# Patient Record
Sex: Female | Born: 1965 | Race: White | Hispanic: No | Marital: Married | Smoking: Never smoker
Health system: Southern US, Community
[De-identification: ages and names within clinical notes are randomized; demographics above are authoritative.]

## PROBLEM LIST (undated history)

## (undated) DIAGNOSIS — M545 Low back pain, unspecified: Secondary | ICD-10-CM

## (undated) DIAGNOSIS — N951 Menopausal and female climacteric states: Secondary | ICD-10-CM

## (undated) DIAGNOSIS — E559 Vitamin D deficiency, unspecified: Secondary | ICD-10-CM

## (undated) DIAGNOSIS — G8929 Other chronic pain: Secondary | ICD-10-CM

## (undated) DIAGNOSIS — L309 Dermatitis, unspecified: Secondary | ICD-10-CM

## (undated) DIAGNOSIS — N879 Dysplasia of cervix uteri, unspecified: Secondary | ICD-10-CM

## (undated) DIAGNOSIS — G43909 Migraine, unspecified, not intractable, without status migrainosus: Secondary | ICD-10-CM

## (undated) DIAGNOSIS — R87619 Unspecified abnormal cytological findings in specimens from cervix uteri: Secondary | ICD-10-CM

## (undated) DIAGNOSIS — F988 Other specified behavioral and emotional disorders with onset usually occurring in childhood and adolescence: Secondary | ICD-10-CM

## (undated) DIAGNOSIS — D649 Anemia, unspecified: Secondary | ICD-10-CM

## (undated) DIAGNOSIS — F419 Anxiety disorder, unspecified: Secondary | ICD-10-CM

## (undated) DIAGNOSIS — T7840XA Allergy, unspecified, initial encounter: Secondary | ICD-10-CM

## (undated) DIAGNOSIS — G4700 Insomnia, unspecified: Secondary | ICD-10-CM

## (undated) HISTORY — DX: Insomnia, unspecified: G47.00

## (undated) HISTORY — PX: NOVASURE ABLATION: SHX5394

## (undated) HISTORY — DX: Dermatitis, unspecified: L30.9

## (undated) HISTORY — DX: Unspecified abnormal cytological findings in specimens from cervix uteri: R87.619

## (undated) HISTORY — DX: Anxiety disorder, unspecified: F41.9

## (undated) HISTORY — DX: Dysplasia of cervix uteri, unspecified: N87.9

## (undated) HISTORY — DX: Menopausal and female climacteric states: N95.1

## (undated) HISTORY — DX: Low back pain: M54.5

## (undated) HISTORY — DX: Anemia, unspecified: D64.9

## (undated) HISTORY — DX: Low back pain, unspecified: M54.50

## (undated) HISTORY — DX: Migraine, unspecified, not intractable, without status migrainosus: G43.909

## (undated) HISTORY — PX: AUGMENTATION MAMMAPLASTY: SUR837

## (undated) HISTORY — PX: OOPHORECTOMY: SHX86

## (undated) HISTORY — DX: Allergy, unspecified, initial encounter: T78.40XA

## (undated) HISTORY — DX: Other chronic pain: G89.29

## (undated) HISTORY — PX: GYNECOLOGIC CRYOSURGERY: SHX857

## (undated) HISTORY — DX: Other specified behavioral and emotional disorders with onset usually occurring in childhood and adolescence: F98.8

## (undated) HISTORY — DX: Vitamin D deficiency, unspecified: E55.9

## (undated) SURGERY — Surgical Case
Anesthesia: *Unknown

---

## 1989-08-15 HISTORY — PX: BREAST ENHANCEMENT SURGERY: SHX7

## 1998-11-18 ENCOUNTER — Ambulatory Visit (HOSPITAL_COMMUNITY): Admission: RE | Admit: 1998-11-18 | Discharge: 1998-11-18 | Payer: Self-pay | Admitting: *Deleted

## 1999-02-23 ENCOUNTER — Other Ambulatory Visit: Admission: RE | Admit: 1999-02-23 | Discharge: 1999-02-23 | Payer: Self-pay | Admitting: *Deleted

## 1999-06-08 ENCOUNTER — Ambulatory Visit (HOSPITAL_COMMUNITY): Admission: RE | Admit: 1999-06-08 | Discharge: 1999-06-08 | Payer: Self-pay | Admitting: *Deleted

## 2000-02-28 ENCOUNTER — Other Ambulatory Visit: Admission: RE | Admit: 2000-02-28 | Discharge: 2000-02-28 | Payer: Self-pay | Admitting: *Deleted

## 2001-06-11 ENCOUNTER — Other Ambulatory Visit: Admission: RE | Admit: 2001-06-11 | Discharge: 2001-06-11 | Payer: Self-pay | Admitting: Internal Medicine

## 2001-09-13 ENCOUNTER — Other Ambulatory Visit: Admission: RE | Admit: 2001-09-13 | Discharge: 2001-09-13 | Payer: Self-pay | Admitting: Obstetrics and Gynecology

## 2001-11-29 ENCOUNTER — Ambulatory Visit: Admission: RE | Admit: 2001-11-29 | Discharge: 2001-11-29 | Payer: Self-pay | Admitting: Internal Medicine

## 2002-03-01 ENCOUNTER — Other Ambulatory Visit: Admission: RE | Admit: 2002-03-01 | Discharge: 2002-03-01 | Payer: Self-pay | Admitting: Obstetrics and Gynecology

## 2002-10-15 ENCOUNTER — Other Ambulatory Visit: Admission: RE | Admit: 2002-10-15 | Discharge: 2002-10-15 | Payer: Self-pay | Admitting: Obstetrics and Gynecology

## 2002-10-23 ENCOUNTER — Ambulatory Visit (HOSPITAL_COMMUNITY): Admission: RE | Admit: 2002-10-23 | Discharge: 2002-10-23 | Payer: Self-pay | Admitting: Internal Medicine

## 2002-10-23 ENCOUNTER — Encounter: Payer: Self-pay | Admitting: Internal Medicine

## 2003-08-27 ENCOUNTER — Ambulatory Visit (HOSPITAL_COMMUNITY): Admission: RE | Admit: 2003-08-27 | Discharge: 2003-08-27 | Payer: Self-pay | Admitting: Internal Medicine

## 2003-11-26 ENCOUNTER — Other Ambulatory Visit: Admission: RE | Admit: 2003-11-26 | Discharge: 2003-11-26 | Payer: Self-pay | Admitting: Obstetrics and Gynecology

## 2004-09-13 ENCOUNTER — Other Ambulatory Visit: Admission: RE | Admit: 2004-09-13 | Discharge: 2004-09-13 | Payer: Self-pay | Admitting: Internal Medicine

## 2004-09-17 ENCOUNTER — Ambulatory Visit (HOSPITAL_COMMUNITY): Admission: RE | Admit: 2004-09-17 | Discharge: 2004-09-17 | Payer: Self-pay | Admitting: Internal Medicine

## 2005-11-14 ENCOUNTER — Other Ambulatory Visit: Admission: RE | Admit: 2005-11-14 | Discharge: 2005-11-14 | Payer: Self-pay | Admitting: Obstetrics & Gynecology

## 2006-09-26 ENCOUNTER — Other Ambulatory Visit: Admission: RE | Admit: 2006-09-26 | Discharge: 2006-09-26 | Payer: Self-pay | Admitting: Internal Medicine

## 2006-10-12 ENCOUNTER — Ambulatory Visit (HOSPITAL_COMMUNITY): Admission: RE | Admit: 2006-10-12 | Discharge: 2006-10-12 | Payer: Self-pay | Admitting: Internal Medicine

## 2006-12-21 ENCOUNTER — Ambulatory Visit (HOSPITAL_BASED_OUTPATIENT_CLINIC_OR_DEPARTMENT_OTHER): Admission: RE | Admit: 2006-12-21 | Discharge: 2006-12-22 | Payer: Self-pay | Admitting: Orthopedic Surgery

## 2008-09-15 HISTORY — PX: PAROTIDECTOMY: SUR1003

## 2008-10-10 ENCOUNTER — Ambulatory Visit (HOSPITAL_COMMUNITY): Admission: RE | Admit: 2008-10-10 | Discharge: 2008-10-11 | Payer: Self-pay | Admitting: Otolaryngology

## 2008-10-10 ENCOUNTER — Encounter (INDEPENDENT_AMBULATORY_CARE_PROVIDER_SITE_OTHER): Payer: Self-pay | Admitting: Otolaryngology

## 2010-04-30 ENCOUNTER — Encounter: Admission: RE | Admit: 2010-04-30 | Discharge: 2010-04-30 | Payer: Self-pay | Admitting: Obstetrics & Gynecology

## 2010-08-15 HISTORY — PX: SHOULDER ADHESION RELEASE: SHX773

## 2010-09-04 ENCOUNTER — Encounter: Payer: Self-pay | Admitting: Internal Medicine

## 2010-11-30 LAB — CBC
MCHC: 34.7 g/dL (ref 30.0–36.0)
MCV: 89.7 fL (ref 78.0–100.0)
RBC: 3.98 MIL/uL (ref 3.87–5.11)
WBC: 5.3 10*3/uL (ref 4.0–10.5)

## 2010-11-30 LAB — DIFFERENTIAL: Eosinophils Relative: 1 % (ref 0–5)

## 2010-12-28 NOTE — Op Note (Signed)
Fuller, Carla               ACCOUNT NO.:  [id number]   MEDICAL RECORD NO.:  [id number]          PATIENT TYPE:  OIB   LOCATION:  5128                         FACILITY:  [facility]   PHYSICIAN:  Kinnie Scales. Annalee Genta, M.D.DATE OF BIRTH:  1966-03-18   DATE OF PROCEDURE:  10/10/2008  DATE OF DISCHARGE:                               OPERATIVE REPORT   PREOPERATIVE DIAGNOSIS:  Left parotid mass.   POSTOPERATIVE DIAGNOSIS:  Left parotid mass.   INDICATION FOR SURGERY:  Left parotid mass.   SURGICAL PROCEDURE:  Left superficial parotidectomy with facial nerve  dissection and nerve integrity monitoring (NIMS).   SURGEON:  Kinnie Scales. Annalee Genta, MD   ASSISTANT:  Antony Contras, MD   ANESTHESIA:  General endotracheal.   COMPLICATIONS:  None.   BLOOD LOSS:  Less than 50 mL.   SPECIMENS:  Sent to Pathology.   No complications.   The patient is transferred from the operating room to the recovery room  in stable condition.   BRIEF HISTORY:  The patient is a 45 year old white female, who is  referred for evaluation of a left periparotid mass.  The patient  complained of mild discomfort and sensation of fullness in the left  parotid region.  No history of erythema or infection.  The patient was  evaluated, and a CT scan was obtained, which showed a soft tissue  density mass within the left superficial parotid consistent with an  intraparotid tumor.  No other lymphadenopathy or findings were noted.  Given the patient's history and physical examination, I recommended that  we consider her for left superficial parotidectomy, which was scheduled  under general anesthesia as an outpatient on an elective basis.  The  risks, benefits, and possible complications of surgical procedure were  discussed in detail with the patient and her husband.  They understood  and concurred with our plan for surgery, which is scheduled as above.   PROCEDURE:  The patient was brought to the operating room at  [facility] OR on October 10, 2008, and placed in supine position on  the operating table.  General endotracheal anesthesia was established  without difficulty.  When the patient was adequately anesthetized, she  was injected with 3 mL of 1% lidocaine with 1:100,000 solution of  epinephrine, which was injected along the proposed skin incision in the  left preauricular sulcus and upper neck.  The patient was then  positioned and prepped and draped in sterile fashion.  The [employer]omed nerve  integrity monitoring system (NIMS) was applied at the orbicularis oculi  and orbicularis oris muscle groups on the left-hand side, and the  monitoring was used throughout the surgical portion of the procedure.  With the patient prepped and draped in position properly, the surgical  procedure was begun.   A #15 scalpel was used to create an incision in the preauricular sulcus.  This was carried around the left ear lobe and into the upper neck.  Incisions were made in preexisting skin creases to allow for optimal  healing.  The skin and underlying subcutaneous tissue was then incised  with a 15 scalpel.  The superficial parotid fascia and facial  musculature were identified, and dissection plane was carried out  elevating facial musculature, soft tissue, and skin and leaving the  periparotid fascia intact.  With flaps elevated anteriorly, dissection  was then carried out along the preauricular sulcus following the tragal  cartilage medially and dissecting the superior aspect of the parotid  gland anteriorly.  Inferiorly, the anterior border of  sternocleidomastoid muscle was identified.  Cautery and blunt sharp  dissection were then used in order to immobilize the parotid gland  anteriorly along the sternocleidomastoid muscle.  The dissection was  then carried out along the posterior aspect of the parotid gland.  The  common trunk of the facial nerve was identified and the superior and   inferior branches were carefully dissected.  The parotid mass was  located in the inferior posterior portion of the gland.  The superior  branch of the facial nerve was not dissected beyond the initial  division.  The entire inferior component of the facial nerve was then  dissected.  Using blunt and sharp dissection as well as the harmonic  scalpel, the parotid gland tissue was divided, and the superficial  aspect of the gland including the left posterior tumor was resected.  Common facial vein was identified and preserved.  The inferior branches  of the facial nerve were carefully dissected and preserved and the tumor  and surrounding parotid gland tissue was removed from the surgical  field.  This was sent to Pathology for gross microscopic evaluation.  The patient's wound was then thoroughly irrigated with sterile saline  solution.  There was no active bleeding.  The facial nerve branches were  then individually stimulated with the NIMS monitor set at 0.50 mA, and  there was excellent facial movement throughout.  The patient's wound was  then closed in multiple layers.  A 7-French round drain was placed at  the depth of the incision and sutured to the surrounding skin with a 3-0  Ethilon suture.  The deep periparotid fascia was then closed using a 4-0  Vicryl suture in an interrupted fashion.  Deep subcutaneous closure with  a 5-0 Vicryl suture interrupted and final skin edge was closed with a  running locked 6-0 Ethilon suture.  The patient's wound was then dressed  with bacitracin ointment.  She was awakened from anesthetic, extubated,  and was transferred from the operating room to the recovery room in  stable condition.  There were no complications and blood loss was less  than 50 mL.           ______________________________  Kinnie Scales. Annalee Genta, M.D.     DLS/MEDQ  D:  33/29/5188  T:  10/11/2008  Job:  [id number]

## 2010-12-31 NOTE — Op Note (Signed)
Carla Fuller, Carla Fuller               ACCOUNT NO.:  [id number]   MEDICAL RECORD NO.:  [id number]          PATIENT TYPE:  AMB   LOCATION:  DSC                          FACILITY:  [facility]   PHYSICIAN:  Katy Fitch. Sypher, M.D. DATE OF BIRTH:  April 04, 1966   DATE OF PROCEDURE:  12/21/2006  DATE OF DISCHARGE:                               OPERATIVE REPORT   PREOPERATIVE DIAGNOSIS:  Adhesive capsulitis, left shoulder.   POSTOPERATIVE DIAGNOSIS:  Adhesive capsulitis, left shoulder with  identification of minor partial-thickness rotator cuff degenerative tear  and stage II impingement due to type 2 acromion with adhesive bursitis  in subacromial space and sub-acromioclavicular joint region.   OPERATION:  1. Examination of left shoulder under anesthesia demonstrating      profound adhesive capsulitis with virtually no glenohumeral      rotational motion.  2. Manipulation of left shoulder to lyse adhesive capsulitis      adhesions.  3. Arthroscopic evaluation of left glenohumeral joint followed by      arthroscopic capsulectomy and debridement of granulation tissue      with electrocautery of hyperemic capsular tissues  4. Arthroscopic subacromial debridement with extensive tenolysis of      rotator cuff, resection of the inflammatory bursa and minimal      anteromedial acromioplasty to prevent persistent impingement.   OPERATIONS:  Katy Fitch. Sypher, M.D.   ASSISTANT:  Annye Rusk, P.A.-C.   ANESTHESIA:  General endotracheal supplemented by a left interscalene  block.   SUPERVISING ANESTHESIOLOGIST:  Janetta Hora. Gelene Mink, M.D.   INDICATIONS:  Carla Fuller is a 45 year old woman referred through the  courtesy of Dr. Marisue Brooklyn for evaluation and management of a painful  and stiff left shoulder.  Clinical examination revealed signs of chronic  adhesive capsulitis.   She has failed a more than 71-month course of supervised physical  therapy.  She had progressive pain, loss of range of  motion and was  developing compensatory AC joint pain.   Due to a failure to respond to nonoperative measures, she is brought to  the operating room at this time for evaluation of her shoulder under  anesthesia followed by anticipated arthroscopic debridement,  capsulectomy and subacromial decompression.   After informed consent, she is brought to the operating at this time.   PROCEDURE:  Carla Fuller was brought to operating room and placed in a  supine position upon the operating table.   Dr. Gelene Mink had placed an interscalene block in the holding area with  excellent anesthesia of the left forequarter.   She was transferred to room #2, placed in supine position upon the  operating table and under Dr. Thornton Dales strict supervision, general  endotracheal anesthesia induced.  She was carefully positioned in the  beach-chair position with the aid of a torso and headholder designed for  shoulder arthroscopy.  Her calves were placed in passive compression  devices for prevention of deep vein thrombosis.   The left shoulder was examined under anesthesia.  Initially, her  combined elevation was 90 degrees, primarily scapulothoracic.  She had  virtually no internal or external rotation  of the glenohumeral joint due  to severe adhesive capsulitis.   With gentle manipulation, we increased her elevation to 175 degrees  combined and recovered 90 degrees of glenohumeral external rotation and  70 degrees of glenohumeral internal rotation with the scapula  stabilized.   Thereafter, the left arm was prepped with DuraPrep and draped with  impervious arthroscopy drapes.  The shoulder joint was distended with 20  mL of sterile saline with an anterior spinal needle instrumentation  followed by placement of the scope through a standard posterior viewing  portal with blunt technique.   Diagnostic arthroscopy documented profound adhesive capsulitis and  granulation tissue throughout the  glenohumeral joint.  Multiple digital  images were obtained, documenting this predicament.  An anterior portal  was created under direct vision followed by use of a suction shaver to  debride the granulation tissue followed by use of a bipolar cautery to  obtain hemostasis and to coagulate the hypertrophic synovitis.   After thorough debridement of the glenohumeral joint, the rotator cuff  and capsuloligamentous structures were examined.  There was noted be a  partial deep surface supraspinatus rotator cuff degenerative  predicament.  There was minor fray within the joint; this was debrided  with a suction shaver.  The labrum was intact.  The biceps origin was  intact.  The anterior capsuloligamentous structures as well as the  inferior recess were normal with respect to the integrity of the  ligaments; however, there was granulation tissue in the inferior  capsule.  This could not be addressed arthroscopically due to inability  to instrument the shaver into this position.   After complete debridement of the glenohumeral joint, the scope was  removed and placed in the subacromial space.  There was a profound  adhesive bursitis with the supraspinatus tendon essentially glued to the  deep surface of the acromion.   We ultimately relieved this by instrumenting the scope into the lateral  portal and used a switching stick to break down the bursal adhesions  followed by placement of the suction shaver.   After complete bursectomy, a tenolysis of the rotator cuff was  accomplished.  There was a small medial anterior acromial osteophyte  that was causing impingement.  This was leveled to a type 1 morphology  with a suction shaver.  The [facility] joint was not problematic.  The  coracoacromial ligament was preserved, but a thorough bursectomy was  accomplished, freeing the adhesions between the coracoacromial ligament  and the supraspinatus and rotator interval.  The subacromial space was then  inspected for bleeding points, which were  carefully electrocauterized with bipolar current followed by repair of  the portals with mattress suture of 3-0 Prolene.  There were no apparent  complications..   For aftercare, Carla Fuller was placed in sling.  She was wakened from  general anesthesia and transferred to the recovery room with stable  signs.   She will be discharged to the care of her family and will be reevaluated  the office in 24 hours.  We will initiate immediate range of motion  exercises.   For aftercare, she is provided a prescription for Dilaudid 2 mg one to  two tablets p.o. q.4-6 h. p.r.n. pain, Motrin 600 mg one p.o. q.6 h.  p.r.n. pain and Keflex 500 mg one p.o. q.8 h. for 3 days as a  prophylactic antibiotic.      Katy Fitch Sypher, M.D.  Electronically Signed     RVS/MEDQ  D:  12/21/2006  T:  12/21/2006  Job:  [id number]   cc:   Lovenia Kim, D.O.

## 2011-06-09 ENCOUNTER — Other Ambulatory Visit: Payer: Self-pay | Admitting: Obstetrics & Gynecology

## 2011-06-09 LAB — HM PAP SMEAR

## 2013-03-04 ENCOUNTER — Other Ambulatory Visit (HOSPITAL_COMMUNITY): Payer: Self-pay | Admitting: Internal Medicine

## 2013-03-04 ENCOUNTER — Other Ambulatory Visit: Payer: Self-pay | Admitting: Internal Medicine

## 2013-03-04 DIAGNOSIS — R1013 Epigastric pain: Secondary | ICD-10-CM

## 2013-03-05 ENCOUNTER — Ambulatory Visit (HOSPITAL_COMMUNITY)
Admission: RE | Admit: 2013-03-05 | Discharge: 2013-03-05 | Disposition: A | Discharge status: Home / Self Care | Payer: BC Managed Care – PPO | Source: Ambulatory Visit | Attending: Internal Medicine | Admitting: Internal Medicine

## 2013-03-05 DIAGNOSIS — R110 Nausea: Secondary | ICD-10-CM | POA: Insufficient documentation

## 2013-03-05 DIAGNOSIS — R1013 Epigastric pain: Secondary | ICD-10-CM | POA: Insufficient documentation

## 2013-03-12 ENCOUNTER — Encounter: Payer: Self-pay | Admitting: Internal Medicine

## 2013-03-13 ENCOUNTER — Ambulatory Visit (INDEPENDENT_AMBULATORY_CARE_PROVIDER_SITE_OTHER): Payer: BC Managed Care – PPO | Admitting: Internal Medicine

## 2013-03-13 ENCOUNTER — Encounter: Payer: Self-pay | Admitting: Internal Medicine

## 2013-03-13 VITALS — BP 90/54 | HR 88 | Ht 64.5 in | Wt 136.1 lb

## 2013-03-13 DIAGNOSIS — K625 Hemorrhage of anus and rectum: Secondary | ICD-10-CM

## 2013-03-13 DIAGNOSIS — R194 Change in bowel habit: Secondary | ICD-10-CM

## 2013-03-13 DIAGNOSIS — R198 Other specified symptoms and signs involving the digestive system and abdomen: Secondary | ICD-10-CM

## 2013-03-13 DIAGNOSIS — K5900 Constipation, unspecified: Secondary | ICD-10-CM

## 2013-03-13 MED ORDER — POLYETHYLENE GLYCOL 3350 17 GM/SCOOP PO POWD: 17.0000 g | Freq: Every day | ORAL | Status: DC

## 2013-03-13 MED ORDER — MOVIPREP 100 G PO SOLR
ORAL | Status: DC
Start: 2013-03-13 — End: 2013-03-22

## 2013-03-13 MED ORDER — LIDOCAINE HCL 2 % EX GEL: CUTANEOUS | Status: DC | PRN

## 2013-03-13 NOTE — Progress Notes (Signed)
Patient ID: Carla Fuller, female   DOB: 1966-08-14, 47 y.o.   MRN: [id number] HPI: Carla Fuller is a 47 yo female with PMH of dysfunctional uterine bleeding, mild anemia, and abnormal cervical Pap smear status post ablative procedure is seen in consultation at the request of Dr. Oneta Rack to evaluate rectal bleeding and change in bowel habit. The patient is alone today. She reports several weeks ago developing what she felt was a viral illness. This consisted of headache, mild epigastric abdominal pain, poor appetite and nausea. This lasted several days but improved. During this time she also developed constipation, lower abdominal pressure and fecal urgency. She reports having hard stools and then she developed hematochezia with rectal bleeding. She reports seeing red and dark red blood mixed with her stool and also with wiping. She even had some bleeding into her underwear. She reports several bowel movements which were extremely painful to pass. She also endorses tenesmus, feeling of incomplete evacuation.  Her upper GI symptoms have completely resolved and her appetite has returned to normal. No further nausea or epigastric abdominal pain. She continues to have her issue with constipation. Prior to this illness she reports inconsistent bowel movements ranging from constipation to at times loose stools. This was not associated with abdominal pain or tenesmus. There really which EC scan red blood on the toilet tissue. Weight has been stable. No prior history of colonoscopy. No family history of colon cancer or IBD.  She reports she did donate blood 2 days ago and prior to doing so a fingerstick hemoglobin was 13.5. LMP 2 years ago prior to her cervical ablative procedure  Past Medical History  Diagnosis Date  . Anemia   . Abnormal cells of cervix     PAP    Past Surgical History  Procedure Laterality Date  . Gynecologic cryosurgery    . Chin surgery Left     knot removed  . Novasure ablation    .  Oophorectomy Right   . Breast enhancement surgery      Current Outpatient Prescriptions  Medication Sig Dispense Refill  . ALPRAZolam (XANAX) 1 MG tablet Take 1 mg by mouth at bedtime as needed for sleep.      Marland Kitchen amphetamine-dextroamphetamine (ADDERALL) 15 MG tablet Take 15 mg by mouth 2 (two) times daily as needed.      . Ascorbic Acid (VITAMIN C) 1000 MG tablet Take 1,000 mg by mouth daily.      . Cholecalciferol (VITAMIN D3) 10000 UNITS capsule Take 10,000 Units by mouth daily.      . clonazePAM (KLONOPIN) 2 MG tablet Take 2 mg by mouth at bedtime.      . COCONUT OIL PO Take 2 tablets by mouth daily.      . Cyanocobalamin (VITAMIN B-12) 1000 MCG SUBL Place 1 tablet under the tongue daily.      Marland Kitchen GARCINIA CAMBOGIA-CHROMIUM PO Take 3 tablets by mouth daily.      . magnesium gluconate (MAGONATE) 500 MG tablet Take 500 mg by mouth 2 (two) times daily.      . Omega 3-6-9 Fatty Acids (OMEGA 3-6-9 COMPLEX PO) Take 2 tablets by mouth daily.      . pregabalin (LYRICA) 75 MG capsule Take 225 mg by mouth at bedtime.      . Probiotic Product (RESTORA PO) Take 1 tablet by mouth daily.      Marland Kitchen lidocaine (XYLOCAINE) 2 % jelly Apply topically as needed. Peri anally as needed  30 mL  1  . MOVIPREP 100 G SOLR Use per prep instruction  1 kit  0  . polyethylene glycol powder (GLYCOLAX/MIRALAX) powder Take 17 g by mouth daily.  255 g  3   No current facility-administered medications for this visit.    Allergies  Allergen Reactions  . Percocet (Oxycodone-Acetaminophen)     Family History  Problem Relation Age of Onset  . Breast cancer Paternal Aunt   . Breast cancer Maternal Aunt   . Diabetes Brother   . Heart disease Father   . Heart disease Paternal Grandfather   . Heart disease Maternal Grandfather   . Tongue cancer Father     squamous cell carcinoma mets    History  Substance Use Topics  . Smoking status: Never Smoker   . Smokeless tobacco: Never Used  . Alcohol Use: Yes     Comment:  rarely-wine    ROS: As per history of present illness, otherwise negative  BP 90/54  Pulse 88  Ht 5' 4.5" (1.638 m)  Wt 136 lb 2 oz (61.746 kg)  BMI 23.01 kg/m2 Constitutional: Well-developed and well-nourished. No distress. HEENT: Normocephalic and atraumatic. Oropharynx is clear and moist. No oropharyngeal exudate. Conjunctivae are normal.  No scleral icterus. Neck: Neck supple. Trachea midline. Cardiovascular: Normal rate, regular rhythm and intact distal pulses.  Pulmonary/chest: Effort normal and breath sounds normal. No wheezing, rales or rhonchi. Abdominal: Soft, nontender, nondistended. Bowel sounds active throughout. There are no masses palpable. No hepatosplenomegaly. Extremities: no clubbing, cyanosis, or edema Neurological: Alert and oriented to person place and time. Skin: Skin is warm and dry. No rashes noted. Psychiatric: Normal mood and affect. Behavior is normal.  IMAGING: COMPLETE ABDOMINAL ULTRASOUND -- 03/05/2013   Comparison:  None.   Findings:   Gallbladder:  No gallstones, gallbladder wall thickening, or pericholecystic fluid.   Common bile duct:  Normal in caliber with no stones.  Duct measures 3.1 mm.   Liver:  Normal in size and echogenicity.  No mass or focal lesion.   IVC:  Appears normal.   Pancreas:  No focal abnormality seen.   Spleen:  Normal in size and echogenicity measuring 6.7 cm.   Right Kidney:  Normal measuring 10.1 cm.   Left Kidney:  Normal measuring 10.8 cm.   Abdominal aorta:  No aneurysm identified.   IMPRESSION: Negative abdominal ultrasound.   ASSESSMENT/PLAN: 47 yo female with PMH of dysfunctional uterine bleeding, mild anemia, and abnormal cervical Pap smear status post ablative procedure is seen in consultation at the request of Dr. Oneta Rack to evaluate rectal bleeding and change in bowel habit.  1.  Rectal bleeding/change in bowel habits/peri-anal pain -- some of her symptoms seem related to an anal fissure,  though she reports no anal fissure was seen on perianal examination recently in her primary care office. However with change in bowel habits and tenesmus, I recommended colonoscopy for further evaluation. We discussed the test today including the risks and benefits and she is agreeable to proceed. I would like her to begin MiraLax 17 g twice daily to soften her stools. Once her stools are more soft she can reduce to once daily.  I also give her topical lidocaine for perianal pain until her procedure. Further recommendations after procedure.

## 2013-03-13 NOTE — Patient Instructions (Addendum)
You have been scheduled for a colonoscopy with propofol. Please follow written instructions given to you at your visit today.  Please pick up your prep kit at the pharmacy within the next 1-3 days. If you use inhalers (even only as needed), please bring them with you on the day of your procedure. Your physician has requested that you go to www.startemmi.com and enter the access code given to you at your visit today. This web site gives a general overview about your procedure. However, you should still follow specific instructions given to you by our office regarding your preparation for the procedure.  We have sent the following medications to your pharmacy for you to pick up at your convenience: Topical lidocaine; use peri-anally as needed for pain.  Start taking Miralax 17 g twice daily until stools become loose then go to once a day.                                                We are excited to introduce MyChart, a new best-in-class service that provides you online access to important information in your electronic medical record. We want to make it easier for you to view your health information - all in one secure location - when and where you need it. We expect MyChart will enhance the quality of care and service we provide.  When you register for MyChart, you can:    View your test results.    Request appointments and receive appointment reminders via email.    Request medication renewals.    View your medical history, allergies, medications and immunizations.    Communicate with your physician's office through a password-protected site.    Conveniently print information such as your medication lists.  To find out if MyChart is right for you, please talk to a member of our clinical staff today. We will gladly answer your questions about this free health and wellness tool.  If you are age 47 or older and want a member of your family to have access to your record, you must provide  written consent by completing a proxy form available at our office. Please speak to our clinical staff about guidelines regarding accounts for patients younger than age 75.  As you activate your MyChart account and need any technical assistance, please call the MyChart technical support line at [phone]) 83-CHART [phone]) or email your question to [email or web address]. If you email your question(s), please include your name, a return phone number and the best time to reach you.  If you have non-urgent health-related questions, you can send a message to our office through MyChart at [email or web address]. If you have a medical emergency, call 911.  Thank you for using MyChart as your new health and wellness resource!   MyChart licensed from [employer],  [id number]. Patents Pending.

## 2013-03-22 ENCOUNTER — Ambulatory Visit (AMBULATORY_SURGERY_CENTER): Payer: BC Managed Care – PPO | Admitting: Internal Medicine

## 2013-03-22 ENCOUNTER — Encounter: Payer: Self-pay | Admitting: Internal Medicine

## 2013-03-22 VITALS — BP 115/57 | HR 76 | Temp 97.9°F | Resp 16 | Ht 64.0 in | Wt 136.0 lb

## 2013-03-22 DIAGNOSIS — K625 Hemorrhage of anus and rectum: Secondary | ICD-10-CM

## 2013-03-22 DIAGNOSIS — K6289 Other specified diseases of anus and rectum: Secondary | ICD-10-CM

## 2013-03-22 DIAGNOSIS — D126 Benign neoplasm of colon, unspecified: Secondary | ICD-10-CM

## 2013-03-22 DIAGNOSIS — R194 Change in bowel habit: Secondary | ICD-10-CM

## 2013-03-22 DIAGNOSIS — R198 Other specified symptoms and signs involving the digestive system and abdomen: Secondary | ICD-10-CM

## 2013-03-22 LAB — HM COLONOSCOPY

## 2013-03-22 MED ORDER — SODIUM CHLORIDE 0.9 % IV SOLN: 500.0000 mL | INTRAVENOUS | Status: DC

## 2013-03-22 NOTE — Progress Notes (Signed)
Patient did not experience any of the following events: a burn prior to discharge; a fall within the facility; wrong site/side/patient/procedure/implant event; or a hospital transfer or hospital admission upon discharge from the facility. (G8907) Patient did not have preoperative order for IV antibiotic SSI prophylaxis. (G8918)  

## 2013-03-22 NOTE — Op Note (Signed)
[facility] [address] [place], [address]   COLONOSCOPY PROCEDURE REPORT  PATIENT: Carla Fuller, Carla Fuller  MR#: [id number] BIRTHDATE: 1966/06/06 , 46  yrs. old GENDER: Female ENDOSCOPIST: Beverley Fiedler, MD REFERRED [id number] Oneta Rack, M.D. PROCEDURE DATE:  03/22/2013 PROCEDURE:   Colonoscopy with cold biopsy polypectomy First Screening Colonoscopy - Avg.  risk and is 50 yrs.  old or older - No.  Prior Negative Screening - Now for repeat screening. N/A  History of Adenoma - Now for follow-up colonoscopy & has been > or = to 3 yrs.  N/A  Polyps Removed Today? Yes. ASA CLASS:   Class II INDICATIONS:Rectal Bleeding and Change in bowel habits. MEDICATIONS: MAC sedation, administered by CRNA and propofol (Diprivan) 200mg  IV  DESCRIPTION OF PROCEDURE:   After the risks benefits and alternatives of the procedure were thoroughly explained, informed consent was obtained.  A digital rectal exam revealed external hemorrhoids.   The LB PFC-H190 [id number]  endoscope was introduced through the anus and advanced to the terminal ileum which was intubated for a short distance. No adverse events experienced. The quality of the prep was excellent, using MoviPrep  The instrument was then slowly withdrawn as the colon was fully examined.   COLON FINDINGS: The mucosa appeared normal in the terminal ileum. Two sessile polyps measuring 3-5 mm in size were found in the ascending colon and sigmoid colon.  Polypectomy was performed with cold forceps.  All resections were complete and all polyp tissue was completely retrieved.   The colon mucosa was otherwise normal. Retroflexed views revealed external hemorrhoids with hypertrophied anal papillae. The time to cecum=3 minutes 15 seconds.  Withdrawal time=12 minutes 14 seconds.  The scope was withdrawn and the procedure completed.  COMPLICATIONS: There were no complications.     ENDOSCOPIC IMPRESSION: 1.   Normal mucosa in the terminal  ileum 2.   Two sessile polyps measuring 3-5 mm in size were found in the ascending colon and sigmoid colon; Polypectomy was performed with cold forceps 3.   The colon mucosa was otherwise normal  RECOMMENDATIONS: 1.  Await pathology results 2.  Timing of repeat colonoscopy will be determined by pathology findings. 3.  Would add over the counter probiotic, such as Align, once daily until bowel habits normalize 4.  Can continue Miralax 17 g daily for constipation and to avoid hard stools   eSigned:  Beverley Fiedler, MD 03/22/2013 10:36 AM   cc: The Patient and Lucky Cowboy, MD   PATIENT NAME:  Carla Fuller, Carla Fuller MR#: [id number]

## 2013-03-22 NOTE — Progress Notes (Signed)
Called to room to assist during endoscopic procedure.  Patient ID and intended procedure confirmed with present staff. Received instructions for my participation in the procedure from the performing physician.  

## 2013-03-22 NOTE — Progress Notes (Signed)
  [facility] Anesthesia Post-op Note  Patient: Carla Fuller  Procedure(s) Performed: colonoscopy  Patient Location: [facility] - Recovery Area  Anesthesia Type: Deep Sedation/Propofol  Level of Consciousness: awake, oriented and patient cooperative  Airway and Oxygen Therapy: Patient Spontanous Breathing  Post-op Pain: none  Post-op Assessment:  Post-op Vital signs reviewed, Patient's Cardiovascular Status Stable, Respiratory Function Stable, Patent Airway, No signs of Nausea or vomiting and Pain level controlled  Post-op Vital Signs: Reviewed and stable  Complications: No apparent anesthesia complications  Prentis Langdon E 10:34 AM

## 2013-03-22 NOTE — Patient Instructions (Addendum)
YOU HAD AN ENDOSCOPIC PROCEDURE TODAY AT THE [facility]: Refer to the procedure report that was given to you for any specific questions about what was found during the examination.  If the procedure report does not answer your questions, please call your gastroenterologist to clarify.  If you requested that your care partner not be given the details of your procedure findings, then the procedure report has been included in a sealed envelope for you to review at your convenience later.  YOU SHOULD EXPECT: Some feelings of bloating in the abdomen. Passage of more gas than usual.  Walking can help get rid of the air that was put into your GI tract during the procedure and reduce the bloating. If you had a lower endoscopy (such as a colonoscopy or flexible sigmoidoscopy) you may notice spotting of blood in your stool or on the toilet paper. If you underwent a bowel prep for your procedure, then you may not have a normal bowel movement for a few days.  DIET: Your first meal following the procedure should be a light meal and then it is ok to progress to your normal diet.  A half-sandwich or bowl of soup is an example of a good first meal.  Heavy or fried foods are harder to digest and may make you feel nauseous or bloated.  Likewise meals heavy in dairy and vegetables can cause extra gas to form and this can also increase the bloating.  Drink plenty of fluids but you should avoid alcoholic beverages for 24 hours.  ACTIVITY: Your care partner should take you home directly after the procedure.  You should plan to take it easy, moving slowly for the rest of the day.  You can resume normal activity the day after the procedure however you should NOT DRIVE or use heavy machinery for 24 hours (because of the sedation medicines used during the test).    SYMPTOMS TO REPORT IMMEDIATELY: A gastroenterologist can be reached at any hour.  During normal business hours, 8:30 AM to 5:00 PM Monday through Friday,  call [phone].  After hours and on weekends, please call the GI answering service at [phone] who will take a message and have the physician on call contact you.   Following lower endoscopy (colonoscopy or flexible sigmoidoscopy):  Excessive amounts of blood in the stool  Significant tenderness or worsening of abdominal pains  Swelling of the abdomen that is new, acute  Fever of 100F or higher  FOLLOW UP: If any biopsies were taken you will be contacted by phone or by letter within the next 1-3 weeks.  Call your gastroenterologist if you have not heard about the biopsies in 3 weeks.  Our staff will call the home number listed on your records the next business day following your procedure to check on you and address any questions or concerns that you may have at that time regarding the information given to you following your procedure. This is a courtesy call and so if there is no answer at the home number and we have not heard from you through the emergency physician on call, we will assume that you have returned to your regular daily activities without incident.  SIGNATURES/CONFIDENTIALITY: You and/or your care partner have signed paperwork which will be entered into your electronic medical record.  These signatures attest to the fact that that the information above on your After Visit Summary has been reviewed and is understood.  Full responsibility of the confidentiality of this   discharge information lies with you and/or your care-partner.    Resume medications. Information given on polyps with discharge instructions. 

## 2013-03-25 ENCOUNTER — Telehealth: Payer: Self-pay | Admitting: *Deleted

## 2013-03-25 NOTE — Telephone Encounter (Signed)
Left message that we called for f/u 

## 2013-03-27 ENCOUNTER — Encounter: Payer: Self-pay | Admitting: Internal Medicine

## 2013-06-21 ENCOUNTER — Other Ambulatory Visit: Payer: Self-pay | Admitting: Emergency Medicine

## 2013-06-24 ENCOUNTER — Encounter: Payer: Self-pay | Admitting: Internal Medicine

## 2013-06-24 DIAGNOSIS — E559 Vitamin D deficiency, unspecified: Secondary | ICD-10-CM | POA: Insufficient documentation

## 2013-06-24 DIAGNOSIS — G8929 Other chronic pain: Secondary | ICD-10-CM | POA: Insufficient documentation

## 2013-06-24 DIAGNOSIS — D649 Anemia, unspecified: Secondary | ICD-10-CM

## 2013-06-24 DIAGNOSIS — F988 Other specified behavioral and emotional disorders with onset usually occurring in childhood and adolescence: Secondary | ICD-10-CM | POA: Insufficient documentation

## 2013-06-24 DIAGNOSIS — G43909 Migraine, unspecified, not intractable, without status migrainosus: Secondary | ICD-10-CM

## 2013-06-24 DIAGNOSIS — G4700 Insomnia, unspecified: Secondary | ICD-10-CM

## 2013-06-24 DIAGNOSIS — F419 Anxiety disorder, unspecified: Secondary | ICD-10-CM

## 2013-06-24 DIAGNOSIS — T7840XA Allergy, unspecified, initial encounter: Secondary | ICD-10-CM | POA: Insufficient documentation

## 2013-07-24 ENCOUNTER — Other Ambulatory Visit: Payer: Self-pay | Admitting: Emergency Medicine

## 2013-07-24 MED ORDER — CLONAZEPAM 2 MG PO TABS: 2.0000 mg | ORAL_TABLET | Freq: Every day | ORAL | Status: DC

## 2013-08-13 ENCOUNTER — Other Ambulatory Visit: Payer: Self-pay | Admitting: Emergency Medicine

## 2013-08-13 MED ORDER — AMPHETAMINE-DEXTROAMPHETAMINE 15 MG PO TABS
30.0000 mg | ORAL_TABLET | Freq: Every day | ORAL | Status: DC
Start: 2013-08-13 — End: 2013-10-24

## 2013-08-28 ENCOUNTER — Other Ambulatory Visit: Payer: Self-pay | Admitting: Emergency Medicine

## 2013-08-28 MED ORDER — ALPRAZOLAM 1 MG PO TABS: 1.0000 mg | ORAL_TABLET | Freq: Three times a day (TID) | ORAL | Status: DC | PRN

## 2013-08-29 ENCOUNTER — Other Ambulatory Visit: Payer: Self-pay | Admitting: Internal Medicine

## 2013-08-29 DIAGNOSIS — H01139 Eczematous dermatitis of unspecified eye, unspecified eyelid: Secondary | ICD-10-CM

## 2013-08-29 MED ORDER — MONTELUKAST SODIUM 10 MG PO TABS: 10.0000 mg | ORAL_TABLET | Freq: Every day | ORAL | Status: DC

## 2013-09-03 ENCOUNTER — Other Ambulatory Visit: Payer: Self-pay | Admitting: Internal Medicine

## 2013-09-03 MED ORDER — PREDNISOLONE ACETATE 1 % OP SUSP: 1.0000 [drp] | Freq: Four times a day (QID) | OPHTHALMIC | Status: DC

## 2013-09-09 ENCOUNTER — Other Ambulatory Visit: Payer: Self-pay | Admitting: Physician Assistant

## 2013-09-09 MED ORDER — CLONAZEPAM 2 MG PO TABS: 2.0000 mg | ORAL_TABLET | Freq: Every day | ORAL | Status: DC

## 2013-09-10 ENCOUNTER — Other Ambulatory Visit: Payer: Self-pay | Admitting: Physician Assistant

## 2013-09-14 ENCOUNTER — Other Ambulatory Visit: Payer: Self-pay | Admitting: Internal Medicine

## 2013-09-14 DIAGNOSIS — T7840XA Allergy, unspecified, initial encounter: Secondary | ICD-10-CM

## 2013-09-14 MED ORDER — PREDNISONE 10 MG PO TABS: 10.0000 mg | ORAL_TABLET | ORAL | Status: DC

## 2013-10-04 ENCOUNTER — Other Ambulatory Visit: Payer: Self-pay | Admitting: Emergency Medicine

## 2013-10-04 MED ORDER — PREGABALIN 75 MG PO CAPS: 225.0000 mg | ORAL_CAPSULE | Freq: Every day | ORAL | Status: DC

## 2013-10-18 ENCOUNTER — Other Ambulatory Visit: Payer: Self-pay | Admitting: Internal Medicine

## 2013-10-18 MED ORDER — PREDNISONE 20 MG PO TABS: ORAL_TABLET | ORAL | Status: DC

## 2013-10-24 ENCOUNTER — Other Ambulatory Visit: Payer: Self-pay | Admitting: Emergency Medicine

## 2013-10-24 MED ORDER — AMPHETAMINE-DEXTROAMPHETAMINE 15 MG PO TABS: 15.0000 mg | ORAL_TABLET | Freq: Two times a day (BID) | ORAL | Status: DC

## 2013-12-02 ENCOUNTER — Other Ambulatory Visit: Payer: Self-pay | Admitting: Physician Assistant

## 2013-12-02 ENCOUNTER — Other Ambulatory Visit: Payer: Self-pay | Admitting: Emergency Medicine

## 2013-12-02 MED ORDER — ALPRAZOLAM 1 MG PO TABS: 1.0000 mg | ORAL_TABLET | Freq: Three times a day (TID) | ORAL | Status: DC | PRN

## 2013-12-09 ENCOUNTER — Other Ambulatory Visit: Payer: Self-pay | Admitting: Emergency Medicine

## 2013-12-09 ENCOUNTER — Other Ambulatory Visit: Payer: Self-pay

## 2013-12-09 MED ORDER — PREGABALIN 75 MG PO CAPS: 225.0000 mg | ORAL_CAPSULE | Freq: Every day | ORAL | Status: DC

## 2013-12-09 NOTE — Telephone Encounter (Signed)
RX lyrica 75 mg called to [facility] [phone]

## 2014-01-02 ENCOUNTER — Other Ambulatory Visit: Payer: Self-pay | Admitting: Internal Medicine

## 2014-01-02 MED ORDER — PREDNISONE 20 MG PO TABS: ORAL_TABLET | ORAL | Status: DC

## 2014-02-11 ENCOUNTER — Encounter: Payer: Self-pay | Admitting: Emergency Medicine

## 2014-02-11 ENCOUNTER — Other Ambulatory Visit: Payer: Self-pay | Admitting: Emergency Medicine

## 2014-02-11 ENCOUNTER — Ambulatory Visit (INDEPENDENT_AMBULATORY_CARE_PROVIDER_SITE_OTHER): Payer: BC Managed Care – PPO | Admitting: Emergency Medicine

## 2014-02-11 VITALS — BP 110/56 | HR 88 | Temp 98.1°F | Resp 16 | Wt 146.8 lb

## 2014-02-11 DIAGNOSIS — R5381 Other malaise: Secondary | ICD-10-CM

## 2014-02-11 DIAGNOSIS — R5383 Other fatigue: Principal | ICD-10-CM

## 2014-02-11 LAB — CBC WITH DIFFERENTIAL/PLATELET
BASOS ABS: 0.1 10*3/uL (ref 0.0–0.1)
BASOS PCT: 1 % (ref 0–1)
Eosinophils Absolute: 0 10*3/uL (ref 0.0–0.7)
Eosinophils Relative: 0 % (ref 0–5)
HCT: 38.4 % (ref 36.0–46.0)
HEMOGLOBIN: 13.4 g/dL (ref 12.0–15.0)
Lymphocytes Relative: 35 % (ref 12–46)
Lymphs Abs: 1.9 10*3/uL (ref 0.7–4.0)
MCH: 29.4 pg (ref 26.0–34.0)
MCHC: 34.9 g/dL (ref 30.0–36.0)
MCV: 84.2 fL (ref 78.0–100.0)
Monocytes Absolute: 0.4 10*3/uL (ref 0.1–1.0)
Monocytes Relative: 8 % (ref 3–12)
NEUTROS ABS: 3.1 10*3/uL (ref 1.7–7.7)
Neutrophils Relative %: 56 % (ref 43–77)
Platelets: 331 10*3/uL (ref 150–400)
RBC: 4.56 MIL/uL (ref 3.87–5.11)
RDW: 14.2 % (ref 11.5–15.5)
WBC: 5.5 10*3/uL (ref 4.0–10.5)

## 2014-02-11 MED ORDER — PREGABALIN 75 MG PO CAPS: 225.0000 mg | ORAL_CAPSULE | Freq: Every day | ORAL | Status: DC

## 2014-02-11 NOTE — Progress Notes (Signed)
Subjective:    Patient ID: Carla Fuller, female    DOB: 16-Oct-1965, 48 y.o.   MRN: [id number]  HPI Comments: 48 yo f/u  for medication adjustment. She has decreased Libido and extra vaginal dryness. SHe had ablation for heavy menses 2 years ago so uncertain of menopausal status. She has d/c nasal spray due to issues with dryness. She has d/c multiple medicines/ vitamins without any changes with libido. She is under increased stress. She notes mild increase with overwhelming hot flash symptoms.   She has noticed increased ear pressure since d/c antihistamines. She has started wearing a mouth guard for grinding at night. She notes left ear > right with discomfort.   She notes Lyrica has helped with back pain and sleep issues a nd she has been able to decrease other sleep medications.   Otalgia      Medication List       This list is accurate as of: 02/11/14  2:30 PM.  Always use your most recent med list.               ALPRAZolam 1 MG tablet  Commonly known as:  XANAX  Take 1 tablet (1 mg total) by mouth 3 (three) times daily as needed for sleep.     pregabalin 75 MG capsule  Commonly known as:  LYRICA  Take 3 capsules (225 mg total) by mouth at bedtime.     RESTORA PO  Take 1 tablet by mouth daily.     Vitamin B-12 1000 MCG Subl  Place 1 tablet under the tongue daily.     vitamin C 1000 MG tablet  Take 1,000 mg by mouth daily.     Vitamin D3 10000 UNITS capsule  Take 10,000 Units by mouth daily.       Allergies  Allergen Reactions  . Percocet [Oxycodone-Acetaminophen]    Past Medical History  Diagnosis Date  . Anemia   . Abnormal cells of cervix     PAP  . Anxiety   . Insomnia   . Unspecified vitamin D deficiency   . ADD (attention deficit disorder)   . Migraines   . Allergy   . Chronic lumbar pain     Sacrum      Review of Systems  HENT: Positive for ear pain.   All other systems reviewed and are negative.  BP 110/56  Pulse 88  Temp(Src)  98.1 F (36.7 C)  Resp 16  Wt 146 lb 12.8 oz (66.588 kg)     Objective:   Physical Exam  Nursing note and vitals reviewed. Constitutional: She is oriented to person, place, and time. She appears well-developed and well-nourished.  HENT:  Head: Normocephalic and atraumatic.  Right Ear: External ear normal.  Left Ear: External ear normal.  Nose: Nose normal.  Mouth/Throat: Oropharynx is clear and moist.  Cloudy TM's bilaterally   Eyes: Conjunctivae and EOM are normal.  Neck: Normal range of motion.  Cardiovascular: Normal rate, regular rhythm, normal heart sounds and intact distal pulses.   Pulmonary/Chest: Effort normal and breath sounds normal.  Musculoskeletal: Normal range of motion.  Lymphadenopathy:    She has no cervical adenopathy.  Neurological: She is alert and oriented to person, place, and time.  Skin: Skin is warm and dry.  Psychiatric: She has a normal mood and affect. Her behavior is normal. Judgment and thought content normal.          Assessment & Plan:  1. Allergic rhinitis- Try NS  QOD, increase H2o, allergy hygiene explained.   2. Vaginal dryness/ decreased libido/ stress- Advised decrease stress, try ABolene moisturizer, check labs. If no change needs Gyn eval. ADvised avocados, almond, zinc

## 2014-02-12 LAB — IRON AND TIBC
%SAT: 34 % (ref 20–55)
Iron: 112 ug/dL (ref 42–145)
TIBC: 327 ug/dL (ref 250–470)
UIBC: 215 ug/dL (ref 125–400)

## 2014-02-12 LAB — FOLATE RBC: RBC FOLATE: 443 ng/mL (ref >280)

## 2014-02-12 LAB — HEPATIC FUNCTION PANEL
ALBUMIN: 5 g/dL (ref 3.5–5.2)
ALT: 19 U/L (ref 0–35)
AST: 18 U/L (ref 0–37)
Alkaline Phosphatase: 71 U/L (ref 39–117)
BILIRUBIN DIRECT: 0.1 mg/dL (ref 0.0–0.3)
BILIRUBIN TOTAL: 0.4 mg/dL (ref 0.2–1.2)
Indirect Bilirubin: 0.3 mg/dL (ref 0.2–1.2)
Total Protein: 7 g/dL (ref 6.0–8.3)

## 2014-02-12 LAB — VITAMIN B12: VITAMIN B 12: 835 pg/mL (ref 211–911)

## 2014-02-12 LAB — BASIC METABOLIC PANEL WITH GFR
BUN: 11 mg/dL (ref 6–23)
CALCIUM: 10.2 mg/dL (ref 8.4–10.5)
CO2: 32 mEq/L (ref 19–32)
CREATININE: 0.9 mg/dL (ref 0.50–1.10)
Chloride: 102 mEq/L (ref 96–112)
GFR, Est African American: 87 mL/min
GFR, Est Non African American: 76 mL/min
Glucose, Bld: 110 mg/dL — ABNORMAL HIGH (ref 70–99)
Potassium: 5.1 mEq/L (ref 3.5–5.3)
SODIUM: 140 meq/L (ref 135–145)

## 2014-02-12 LAB — TSH: TSH: 1.909 u[IU]/mL (ref 0.350–4.500)

## 2014-02-13 ENCOUNTER — Encounter (INDEPENDENT_AMBULATORY_CARE_PROVIDER_SITE_OTHER): Payer: Self-pay

## 2014-02-24 ENCOUNTER — Other Ambulatory Visit: Payer: Self-pay | Admitting: Emergency Medicine

## 2014-02-24 MED ORDER — PREGABALIN 75 MG PO CAPS: 225.0000 mg | ORAL_CAPSULE | Freq: Every day | ORAL | Status: DC

## 2014-03-02 ENCOUNTER — Other Ambulatory Visit: Payer: Self-pay | Admitting: Emergency Medicine

## 2014-03-11 LAB — HM DEXA SCAN

## 2014-03-12 DIAGNOSIS — Z0000 Encounter for general adult medical examination without abnormal findings: Secondary | ICD-10-CM

## 2014-04-23 ENCOUNTER — Other Ambulatory Visit: Payer: Self-pay | Admitting: Physician Assistant

## 2014-04-23 MED ORDER — PREGABALIN 75 MG PO CAPS: 225.0000 mg | ORAL_CAPSULE | Freq: Every day | ORAL | Status: DC

## 2014-04-25 ENCOUNTER — Other Ambulatory Visit: Payer: Self-pay | Admitting: *Deleted

## 2014-04-25 MED ORDER — PREGABALIN 300 MG PO CAPS: ORAL_CAPSULE | ORAL | Status: DC

## 2014-04-30 ENCOUNTER — Other Ambulatory Visit: Payer: Self-pay | Admitting: Emergency Medicine

## 2014-04-30 MED ORDER — LISDEXAMFETAMINE DIMESYLATE 20 MG PO CAPS
ORAL_CAPSULE | ORAL | Status: DC
Start: 2014-04-30 — End: 2014-05-02

## 2014-05-01 ENCOUNTER — Ambulatory Visit: Payer: BC Managed Care – PPO

## 2014-05-01 NOTE — Progress Notes (Signed)
Patient aware of instructions.  

## 2014-05-02 ENCOUNTER — Other Ambulatory Visit: Payer: Self-pay | Admitting: *Deleted

## 2014-05-02 MED ORDER — LISDEXAMFETAMINE DIMESYLATE 20 MG PO CAPS: 20.0000 mg | ORAL_CAPSULE | Freq: Every day | ORAL | Status: DC

## 2014-05-09 ENCOUNTER — Ambulatory Visit (INDEPENDENT_AMBULATORY_CARE_PROVIDER_SITE_OTHER): Payer: BC Managed Care – PPO

## 2014-05-09 DIAGNOSIS — Z23 Encounter for immunization: Secondary | ICD-10-CM

## 2014-05-28 ENCOUNTER — Other Ambulatory Visit: Payer: Self-pay | Admitting: Physician Assistant

## 2014-05-28 MED ORDER — PROMETHAZINE HCL 25 MG PO TABS: 25.0000 mg | ORAL_TABLET | Freq: Four times a day (QID) | ORAL | Status: DC | PRN

## 2014-05-28 MED ORDER — CIPROFLOXACIN HCL 500 MG PO TABS: 500.0000 mg | ORAL_TABLET | Freq: Two times a day (BID) | ORAL | Status: DC

## 2014-05-28 MED ORDER — PREDNISONE 20 MG PO TABS: ORAL_TABLET | ORAL | Status: DC

## 2014-05-28 MED ORDER — FLUCONAZOLE 150 MG PO TABS: 150.0000 mg | ORAL_TABLET | Freq: Every day | ORAL | Status: DC

## 2014-05-28 MED ORDER — AZITHROMYCIN 250 MG PO TABS: ORAL_TABLET | ORAL | Status: DC

## 2014-05-28 MED ORDER — HYOSCYAMINE SULFATE 0.125 MG SL SUBL: 0.1250 mg | SUBLINGUAL_TABLET | SUBLINGUAL | Status: DC | PRN

## 2014-06-17 ENCOUNTER — Other Ambulatory Visit: Payer: Self-pay | Admitting: Internal Medicine

## 2014-06-17 MED ORDER — AMPHETAMINE-DEXTROAMPHETAMINE 30 MG PO TABS: ORAL_TABLET | ORAL | Status: DC

## 2014-07-15 ENCOUNTER — Telehealth: Payer: Self-pay | Admitting: Internal Medicine

## 2014-08-05 NOTE — Telephone Encounter (Signed)
ERROR

## 2014-08-18 ENCOUNTER — Other Ambulatory Visit: Payer: Self-pay | Admitting: Internal Medicine

## 2014-08-30 ENCOUNTER — Other Ambulatory Visit: Payer: Self-pay | Admitting: Internal Medicine

## 2014-09-16 ENCOUNTER — Telehealth: Payer: Self-pay | Admitting: *Deleted

## 2014-09-16 MED ORDER — PREDNISONE 10 MG PO TABS: ORAL_TABLET | ORAL | Status: DC

## 2014-09-16 NOTE — Telephone Encounter (Signed)
Patient c/o increasing symptoms of possible allergic reaction with increasing eye irritation.  Per Kelby Aline, PA-C patient was given Rx for Prednisone 10 mg DP.  Advised patient to take AD and f/u if no relief with Rx.

## 2014-10-10 ENCOUNTER — Other Ambulatory Visit: Payer: Self-pay

## 2014-10-10 DIAGNOSIS — Z1231 Encounter for screening mammogram for malignant neoplasm of breast: Secondary | ICD-10-CM

## 2014-10-16 ENCOUNTER — Telehealth: Payer: Self-pay | Admitting: *Deleted

## 2014-10-16 NOTE — Telephone Encounter (Signed)
LMOM

## 2014-10-16 NOTE — Telephone Encounter (Signed)
TEST!!!

## 2014-11-05 ENCOUNTER — Other Ambulatory Visit: Payer: Self-pay | Admitting: *Deleted

## 2014-11-05 ENCOUNTER — Other Ambulatory Visit: Payer: Self-pay | Admitting: Internal Medicine

## 2014-11-05 MED ORDER — PREGABALIN 300 MG PO CAPS: ORAL_CAPSULE | ORAL | Status: DC

## 2014-11-14 ENCOUNTER — Other Ambulatory Visit: Payer: Self-pay | Admitting: Internal Medicine

## 2014-11-17 ENCOUNTER — Other Ambulatory Visit: Payer: Self-pay | Admitting: Internal Medicine

## 2014-11-17 DIAGNOSIS — J324 Chronic pansinusitis: Secondary | ICD-10-CM

## 2014-11-17 MED ORDER — PREDNISONE 10 MG PO TABS: ORAL_TABLET | ORAL | Status: AC

## 2014-11-17 MED ORDER — AZELASTINE-FLUTICASONE 137-50 MCG/ACT NA SUSP: NASAL | Status: DC

## 2014-11-17 MED ORDER — AZITHROMYCIN 250 MG PO TABS: ORAL_TABLET | ORAL | Status: DC

## 2014-12-15 ENCOUNTER — Other Ambulatory Visit: Payer: Self-pay | Admitting: Physician Assistant

## 2014-12-15 MED ORDER — AMPHETAMINE-DEXTROAMPHETAMINE 30 MG PO TABS: 30.0000 mg | ORAL_TABLET | Freq: Every day | ORAL | Status: DC

## 2015-01-05 ENCOUNTER — Other Ambulatory Visit: Payer: Self-pay | Admitting: Internal Medicine

## 2015-01-06 ENCOUNTER — Other Ambulatory Visit: Payer: Self-pay

## 2015-01-06 MED ORDER — GABAPENTIN 300 MG PO CAPS: 300.0000 mg | ORAL_CAPSULE | Freq: Three times a day (TID) | ORAL | Status: DC

## 2015-02-09 ENCOUNTER — Other Ambulatory Visit: Payer: Self-pay | Admitting: Internal Medicine

## 2015-02-27 ENCOUNTER — Other Ambulatory Visit: Payer: Self-pay | Admitting: Internal Medicine

## 2015-02-27 MED ORDER — AZITHROMYCIN 250 MG PO TABS: ORAL_TABLET | ORAL | Status: DC

## 2015-03-03 ENCOUNTER — Other Ambulatory Visit: Payer: Self-pay | Admitting: Obstetrics & Gynecology

## 2015-03-04 LAB — CYTOLOGY - PAP

## 2015-03-06 ENCOUNTER — Other Ambulatory Visit: Payer: Self-pay | Admitting: Obstetrics & Gynecology

## 2015-03-06 DIAGNOSIS — R928 Other abnormal and inconclusive findings on diagnostic imaging of breast: Secondary | ICD-10-CM

## 2015-03-13 ENCOUNTER — Ambulatory Visit
Admission: RE | Admit: 2015-03-13 | Discharge: 2015-03-13 | Disposition: A | Discharge status: Home / Self Care | Payer: 59 | Source: Ambulatory Visit | Attending: Obstetrics & Gynecology | Admitting: Obstetrics & Gynecology

## 2015-03-13 DIAGNOSIS — R928 Other abnormal and inconclusive findings on diagnostic imaging of breast: Secondary | ICD-10-CM

## 2015-03-23 ENCOUNTER — Other Ambulatory Visit: Payer: Self-pay | Admitting: Physician Assistant

## 2015-03-23 ENCOUNTER — Other Ambulatory Visit: Payer: Self-pay | Admitting: Internal Medicine

## 2015-03-23 DIAGNOSIS — F419 Anxiety disorder, unspecified: Secondary | ICD-10-CM

## 2015-03-23 MED ORDER — ALPRAZOLAM 1 MG PO TABS: ORAL_TABLET | ORAL | Status: DC

## 2015-03-23 MED ORDER — AMPHETAMINE-DEXTROAMPHETAMINE 30 MG PO TABS: 30.0000 mg | ORAL_TABLET | Freq: Every day | ORAL | Status: DC

## 2015-03-24 NOTE — Telephone Encounter (Signed)
Called Rx into pharm

## 2015-05-05 ENCOUNTER — Ambulatory Visit (INDEPENDENT_AMBULATORY_CARE_PROVIDER_SITE_OTHER): Payer: 59 | Admitting: Internal Medicine

## 2015-05-05 DIAGNOSIS — M25561 Pain in right knee: Secondary | ICD-10-CM

## 2015-05-05 DIAGNOSIS — M25562 Pain in left knee: Secondary | ICD-10-CM

## 2015-05-05 MED ORDER — MELOXICAM 15 MG PO TABS: 15.0000 mg | ORAL_TABLET | Freq: Every day | ORAL | Status: DC

## 2015-05-05 NOTE — Progress Notes (Signed)
Subjective:    Patient ID: Carla Fuller, female    DOB: 31-Oct-1965, 49 y.o.   MRN: [id number]  HPI  Patient presents to the office for evaluation of bilateral knee pain which has been bothering her for 2 months.  Patient reports that the left knee has been bothering her on the lateral side and the right knee on the medial side.  She reports that she has had some aching and sensation on the right knee that does not allow her to straddle or put pressure in a loaded position.  The left knee bothers her more with extra exercise.  She does have a lot of popping sensation in the legs.  She has noticed mild swelling in the legs.  She has tried ice and heat without real relief.  She has taken advil or tylenol on a regular basis at bedtime.  Mildly helps.  No injury that she can think of.   Review of Systems  Constitutional: Negative for fever, chills and fatigue.  Gastrointestinal: Negative for nausea and vomiting.  Musculoskeletal: Positive for joint swelling and arthralgias. Negative for myalgias.       Objective:   Physical Exam  Constitutional: She is oriented to person, place, and time. She appears well-developed and well-nourished. No distress.  HENT:  Head: Normocephalic.  Mouth/Throat: Oropharynx is clear and moist. No oropharyngeal exudate.  Eyes: Conjunctivae are normal.  Neck: Normal range of motion. Neck supple. No JVD present. No thyromegaly present.  Cardiovascular: Normal rate, regular rhythm, normal heart sounds and intact distal pulses.  Exam reveals no gallop and no friction rub.   No murmur heard. Pulses:      Dorsalis pedis pulses are 2+ on the right side, and 2+ on the left side.       Posterior tibial pulses are 2+ on the right side, and 2+ on the left side.  Pulmonary/Chest: Effort normal and breath sounds normal. No respiratory distress. She has no wheezes. She has no rales. She exhibits no tenderness.  Musculoskeletal: Normal range of motion.       Right knee:  She exhibits abnormal meniscus. She exhibits normal range of motion, no swelling, no effusion, no ecchymosis, no deformity, no laceration, no erythema, normal alignment, no LCL laxity, normal patellar mobility, no bony tenderness and no MCL laxity. Tenderness found. Medial joint line tenderness noted. No lateral joint line, no MCL, no LCL and no patellar tendon tenderness noted.  Right knee:  Palpable crepitus with flexion and extension.  Positive McMurrays sign.  Negative patellar grind test.  Left Knee:  Negative McMurrays, negative patellar grind test.    Lymphadenopathy:    She has no cervical adenopathy.  Neurological: She is alert and oriented to person, place, and time.  Skin: Skin is warm and dry. She is not diaphoretic.  Psychiatric: She has a normal mood and affect. Her behavior is normal. Judgment and thought content normal.  Nursing note and vitals reviewed.         Assessment & Plan:    1. Knee meniscus pain, right  - Ambulatory referral to Orthopedics - meloxicam (MOBIC) 15 MG tablet; Take 1 tablet (15 mg total) by mouth daily.  Dispense: 30 tablet; Refill: 1 -avoid squatting and kneeling -may require intraarticular injection if no improvement with oral antiinflammatory.    2. Bilateral knee pain -replace shoes and work on straight alignment of feet.  Go to [employer] or [employer] -wear supportive shoes -gently stretching IT band -mobic

## 2015-05-15 ENCOUNTER — Other Ambulatory Visit: Payer: Self-pay | Admitting: Physician Assistant

## 2015-05-15 MED ORDER — CYCLOBENZAPRINE HCL 10 MG PO TABS: 10.0000 mg | ORAL_TABLET | Freq: Three times a day (TID) | ORAL | Status: DC | PRN

## 2015-05-15 MED ORDER — AZITHROMYCIN 250 MG PO TABS: ORAL_TABLET | ORAL | Status: AC

## 2015-09-10 ENCOUNTER — Ambulatory Visit (INDEPENDENT_AMBULATORY_CARE_PROVIDER_SITE_OTHER): Payer: 59 | Admitting: Internal Medicine

## 2015-09-10 ENCOUNTER — Encounter: Payer: Self-pay | Admitting: Internal Medicine

## 2015-09-10 VITALS — BP 106/60 | HR 76 | Temp 98.0°F | Resp 16 | Ht 64.5 in | Wt 152.0 lb

## 2015-09-10 DIAGNOSIS — M7551 Bursitis of right shoulder: Secondary | ICD-10-CM | POA: Diagnosis not present

## 2015-09-10 MED ORDER — DIAZEPAM 5 MG PO TABS: 5.0000 mg | ORAL_TABLET | Freq: Four times a day (QID) | ORAL | Status: DC | PRN

## 2015-09-10 NOTE — Patient Instructions (Signed)

## 2015-09-10 NOTE — Progress Notes (Signed)
   Subjective:    Patient ID: Carla Fuller, female    DOB: [birth year], 50 y.o.   MRN: [id number]  Shoulder Pain  Pertinent negatives include no fever.   Patient presents to the office for evaluation of right shoulder pain which has been increasing over the last month.  She reports that the pain is constantant and sometimes has some sharp shooting pains which come with certain movements.  She reports that she cannot think of an injury which started the pain.  She did recently just move her Mother in law in to a nursing home.  She is right handed.  She has tried ice, heat, massage therapy, tens unit, 800 mg ibuprofen BID, 1000 mg tylenol, meloxicam, and stretching which hasn't helped at all. 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  She feels that this is similar.     Review of Systems  Constitutional: Negative for fever, chills and fatigue.  Gastrointestinal: Negative for nausea and vomiting.  Musculoskeletal: Positive for arthralgias. Negative for joint swelling.       Objective:   Physical Exam  Constitutional: She is oriented to person, place, and time. She appears well-developed and well-nourished. No distress.  HENT:  Head: Normocephalic.  Mouth/Throat: Oropharynx is clear and moist. No oropharyngeal exudate.  Eyes: Conjunctivae are normal. No scleral icterus.  Neck: Normal range of motion. Neck supple. No JVD present. No thyromegaly present.  Cardiovascular: Normal rate, regular rhythm, normal heart sounds and intact distal pulses.   Pulses:      Radial pulses are 2+ on the right side, and 2+ on the left side.  Pulmonary/Chest: Effort normal and breath sounds normal. No respiratory distress. She has no wheezes. She has no rales. She exhibits no tenderness.  Musculoskeletal:       Right shoulder: She exhibits decreased range of motion, tenderness, pain and spasm. She exhibits no bony tenderness, no swelling, no effusion, no crepitus, no  deformity, no laceration, normal pulse and normal strength.  Pain and tenderness to the right trapezius with palpable spasm.  There is some tenderness to the right lateral humerus.  Pain with lateral abduction.  Pain with passive ROM above 90 degrees.  Positive hawkins kennedy.  Empty can with 4/5 strength.    Lymphadenopathy:    She has no cervical adenopathy.  Neurological: She is alert and oriented to person, place, and time.  Skin: Skin is warm and dry. She is not diaphoretic.  Psychiatric: She has a normal mood and affect. Her behavior is normal. Judgment and thought content normal.  Nursing note and vitals reviewed.   Filed Vitals:   09/10/15 0918  BP: 106/60  Pulse: 76  Temp: 98 F (36.7 C)  Resp: 16      Assessment & Plan:    1. Shoulder bursitis, right -call in valium for muscle spasms -likely impingement syndrome w/ tendonitis -rotator cuff tear unlikely based on exam -likely needs injection -call valium in for muscle spasms -heat prn - AMB referral to orthopedics

## 2015-09-23 ENCOUNTER — Other Ambulatory Visit: Payer: Self-pay | Admitting: Internal Medicine

## 2015-10-05 ENCOUNTER — Other Ambulatory Visit: Payer: Self-pay | Admitting: Internal Medicine

## 2015-10-09 ENCOUNTER — Other Ambulatory Visit: Payer: Self-pay | Admitting: Internal Medicine

## 2015-10-09 MED ORDER — DIAZEPAM 5 MG PO TABS: 5.0000 mg | ORAL_TABLET | Freq: Four times a day (QID) | ORAL | Status: DC | PRN

## 2015-10-12 ENCOUNTER — Other Ambulatory Visit: Payer: Self-pay | Admitting: Internal Medicine

## 2015-10-13 ENCOUNTER — Ambulatory Visit: Payer: Self-pay | Admitting: Internal Medicine

## 2015-10-15 ENCOUNTER — Ambulatory Visit (INDEPENDENT_AMBULATORY_CARE_PROVIDER_SITE_OTHER): Payer: 59 | Admitting: Physician Assistant

## 2015-10-15 ENCOUNTER — Encounter: Payer: Self-pay | Admitting: Physician Assistant

## 2015-10-15 VITALS — BP 102/60 | HR 66 | Temp 97.9°F | Ht 64.0 in | Wt 159.0 lb

## 2015-10-15 DIAGNOSIS — M7551 Bursitis of right shoulder: Secondary | ICD-10-CM

## 2015-10-15 DIAGNOSIS — M5412 Radiculopathy, cervical region: Secondary | ICD-10-CM

## 2015-10-15 MED ORDER — CELECOXIB 200 MG PO CAPS: 200.0000 mg | ORAL_CAPSULE | Freq: Two times a day (BID) | ORAL | Status: DC

## 2015-10-15 MED ORDER — DICLOFENAC EPOLAMINE 1.3 % TD PTCH: 1.0000 | MEDICATED_PATCH | Freq: Two times a day (BID) | TRANSDERMAL | Status: DC

## 2015-10-15 MED ORDER — HYDROCODONE-ACETAMINOPHEN 5-325 MG PO TABS: 1.0000 | ORAL_TABLET | Freq: Four times a day (QID) | ORAL | Status: DC | PRN

## 2015-10-15 NOTE — Progress Notes (Signed)
   Subjective:    Patient ID: Carla Fuller, female    DOB: 1965-11-01, 50 y.o.   MRN: [id number]  HPI 50 y.o. right handed WF with right shoulder pain x 3 month. Has had two shots with orthopedics, has still been on mobic 10, gabapentin, valium at night and 600 mg iburpofen at night. She is on ibuprofen 800mg  with breakfast and lunch. Not doing ice/heat. Has aching down complete anterior shoulder, down her biceps, forearm and to her hand, occ fatigue/numbness in her right hand. She does have neck and right trap tenderness, will pop neck through out the day.   Blood pressure 102/60, pulse 66, temperature 97.9 F (36.6 C), temperature source Temporal, height 5\' 4"  (1.626 m), weight 159 lb (72.122 kg), SpO2 97 %.   Review of Systems  Constitutional: Negative for fever, chills and fatigue.  Gastrointestinal: Negative for nausea and vomiting.  Musculoskeletal: Positive for myalgias, arthralgias and neck pain. Negative for back pain, joint swelling, gait problem and neck stiffness.  Skin: Negative for rash.  Neurological: Negative for dizziness, light-headedness and headaches.       Objective:   Physical Exam  Constitutional: She is oriented to person, place, and time. She appears well-developed and well-nourished. No distress.  HENT:  Head: Normocephalic.  Mouth/Throat: Oropharynx is clear and moist. No oropharyngeal exudate.  Eyes: Conjunctivae are normal. No scleral icterus.  Neck: Normal range of motion. Neck supple. No JVD present. No thyromegaly present.  Cardiovascular: Normal rate, regular rhythm, normal heart sounds and intact distal pulses.   Pulses:      Radial pulses are 2+ on the right side, and 2+ on the left side.  Pulmonary/Chest: Effort normal and breath sounds normal. No respiratory distress. She has no wheezes. She has no rales. She exhibits no tenderness.  Musculoskeletal:       Right shoulder: She exhibits decreased range of motion, tenderness, pain and spasm. She  exhibits no bony tenderness, no swelling, no effusion, no crepitus, no deformity, no laceration, normal pulse and normal strength.  Pain and tenderness to the right trapezius with palpable spasm, decreased ROM neck.  There is some tenderness to the right subacromial bursa/biceps tendon. Pain with lateral abduction.  Pain with passive ROM above 90 degrees.  Normal distal neurovascular exam.   Lymphadenopathy:    She has no cervical adenopathy.  Neurological: She is alert and oriented to person, place, and time.  Skin: Skin is warm and dry. She is not diaphoretic.  Psychiatric: She has a normal mood and affect. Her behavior is normal. Judgment and thought content normal.  Nursing note and vitals reviewed.       Assessment & Plan:  Right shoulder pain versus/with cerivical radiculopathy Get cervical neck xray Taking to much antiinflammatory, celebrex 200mg  BID, Norco 5 for sleep. Flector patches for acute pain, follow up back with ortho for possible US guided injection.

## 2015-10-16 ENCOUNTER — Ambulatory Visit (HOSPITAL_COMMUNITY)
Admission: RE | Admit: 2015-10-16 | Discharge: 2015-10-16 | Disposition: A | Discharge status: Home / Self Care | Payer: 59 | Source: Ambulatory Visit | Attending: Physician Assistant | Admitting: Physician Assistant

## 2015-10-16 DIAGNOSIS — M25511 Pain in right shoulder: Secondary | ICD-10-CM | POA: Insufficient documentation

## 2015-10-16 DIAGNOSIS — M5412 Radiculopathy, cervical region: Secondary | ICD-10-CM

## 2015-10-19 ENCOUNTER — Other Ambulatory Visit: Payer: Self-pay | Admitting: Internal Medicine

## 2015-10-21 ENCOUNTER — Ambulatory Visit: Payer: Self-pay | Admitting: Internal Medicine

## 2015-12-07 ENCOUNTER — Other Ambulatory Visit: Payer: Self-pay | Admitting: Internal Medicine

## 2015-12-07 ENCOUNTER — Other Ambulatory Visit: Payer: Self-pay | Admitting: Emergency Medicine

## 2015-12-08 NOTE — Telephone Encounter (Signed)
Rx called into [facility] pharmacy.

## 2015-12-08 NOTE — Telephone Encounter (Signed)
Rx called into [facility]

## 2015-12-29 ENCOUNTER — Ambulatory Visit (INDEPENDENT_AMBULATORY_CARE_PROVIDER_SITE_OTHER): Payer: 59 | Admitting: Physician Assistant

## 2015-12-29 ENCOUNTER — Other Ambulatory Visit: Payer: 59

## 2015-12-29 ENCOUNTER — Other Ambulatory Visit: Payer: Self-pay

## 2015-12-29 ENCOUNTER — Encounter: Payer: Self-pay | Admitting: Physician Assistant

## 2015-12-29 VITALS — BP 108/50 | HR 67 | Temp 97.7°F | Resp 16 | Ht 64.5 in | Wt 152.0 lb

## 2015-12-29 DIAGNOSIS — Z1389 Encounter for screening for other disorder: Secondary | ICD-10-CM

## 2015-12-29 DIAGNOSIS — Z79899 Other long term (current) drug therapy: Secondary | ICD-10-CM

## 2015-12-29 DIAGNOSIS — Z131 Encounter for screening for diabetes mellitus: Secondary | ICD-10-CM

## 2015-12-29 DIAGNOSIS — E559 Vitamin D deficiency, unspecified: Secondary | ICD-10-CM

## 2015-12-29 DIAGNOSIS — R5383 Other fatigue: Secondary | ICD-10-CM | POA: Diagnosis not present

## 2015-12-29 DIAGNOSIS — R232 Flushing: Secondary | ICD-10-CM

## 2015-12-29 DIAGNOSIS — Z13220 Encounter for screening for lipoid disorders: Secondary | ICD-10-CM | POA: Diagnosis not present

## 2015-12-29 DIAGNOSIS — Z1231 Encounter for screening mammogram for malignant neoplasm of breast: Secondary | ICD-10-CM

## 2015-12-29 DIAGNOSIS — N951 Menopausal and female climacteric states: Secondary | ICD-10-CM

## 2015-12-29 DIAGNOSIS — D649 Anemia, unspecified: Secondary | ICD-10-CM

## 2015-12-29 DIAGNOSIS — Z139 Encounter for screening, unspecified: Secondary | ICD-10-CM | POA: Diagnosis not present

## 2015-12-29 LAB — CBC WITH DIFFERENTIAL/PLATELET
Basophils Absolute: 61 cells/uL (ref 0–200)
Basophils Relative: 1 %
Eosinophils Absolute: 122 cells/uL (ref 15–500)
Eosinophils Relative: 2 %
HEMATOCRIT: 38.1 % (ref 35.0–45.0)
Hemoglobin: 12.5 g/dL (ref 11.7–15.5)
LYMPHS PCT: 36 %
Lymphs Abs: 2196 cells/uL (ref 850–3900)
MCH: 29.6 pg (ref 27.0–33.0)
MCHC: 32.8 g/dL (ref 32.0–36.0)
MCV: 90.3 fL (ref 80.0–100.0)
MONO ABS: 610 {cells}/uL (ref 200–950)
MONOS PCT: 10 %
MPV: 8.6 fL (ref 7.5–12.5)
NEUTROS PCT: 51 %
Neutro Abs: 3111 cells/uL (ref 1500–7800)
Platelets: 334 10*3/uL (ref 140–400)
RBC: 4.22 MIL/uL (ref 3.80–5.10)
RDW: 13.6 % (ref 11.0–15.0)
WBC: 6.1 10*3/uL (ref 3.8–10.8)

## 2015-12-29 LAB — HEMOGLOBIN A1C
Hgb A1c MFr Bld: 5.2 % (ref <5.7)
Mean Plasma Glucose: 103 mg/dL

## 2015-12-29 MED ORDER — GABAPENTIN 300 MG PO CAPS: 300.0000 mg | ORAL_CAPSULE | Freq: Three times a day (TID) | ORAL | Status: DC

## 2015-12-29 MED ORDER — AMPHETAMINE-DEXTROAMPHETAMINE 30 MG PO TABS: 30.0000 mg | ORAL_TABLET | Freq: Every day | ORAL | Status: DC

## 2015-12-29 MED ORDER — ESCITALOPRAM OXALATE 10 MG PO TABS: 10.0000 mg | ORAL_TABLET | Freq: Every day | ORAL | Status: DC

## 2015-12-29 NOTE — Progress Notes (Signed)
Assessment and Plan:  1. Vitamin D deficiency - VITAMIN D 25 Hydroxy (Vit-D Deficiency, Fractures)  2. Anemia, unspecified anemia type - CBC with Differential/Platelet - Iron and TIBC - Ferritin - Vitamin B12  3. Other fatigue Check labs, can be from anxiety/hormones- willing to try  - TSH  4. Screening cholesterol level - Lipid panel  5. Screening for blood or protein in urine - Urinalysis, Routine w reflex microscopic (not at [facility]) - Microalbumin / creatinine urine ratio  6. Screening for diabetes mellitus - Hemoglobin A1c - Insulin, fasting  7. Medication management - BASIC METABOLIC PANEL WITH GFR - Hepatic function panel - Magnesium  8. Hot flashes Get on lexapro  9. Dyspareunia ? Need for pelvic floor PT for paresthesias and dyspareunia will wait until after shoulder.  Has had negative MRI/Xrays and has done PT   Continue diet and meds as discussed. Further disposition pending results of labs. Over 30 minutes of exam, counseling, chart review, and critical decision making was performed  HPI 50 y.o. female  presents for 3 month follow up on hypertension, cholesterol, prediabetes, and vitamin D deficiency.   Her blood pressure has been controlled at home, today their BP is BP: (!) 108/50 mmHg  She does workout. She denies chest pain, shortness of breath, dizziness.  She has had recent right frozen shoulder, has been following with Dr. Latanya Maudlin, has seen PT, and now has surgery June 1st. She is just on melatonin, and has been off gabapentin 300mg  at night for paresthesias in her bilateral buttcocks, still not sleeping well at night.  She has vaginal dryness, painful intercourse.  She is very frustrated with her weight, has gone from size 8 to 12, has been working out consistently and eating well for 2 months, but she is down 7lbs. She has been having fatigue, increased thirst. She was on estrogen for hot flashes but does not want a hormone.  BMI is Body mass index is  25.7 kg/(m^2)., she is working on diet and exercise. Wt Readings from Last 3 Encounters:  12/29/15 152 lb (68.947 kg)  10/15/15 159 lb (72.122 kg)  09/10/15 152 lb (68.947 kg)     Current Medications:  Current Outpatient Prescriptions on File Prior to Visit  Medication Sig Dispense Refill  . Cholecalciferol (VITAMIN D3) 10000 UNITS capsule Take 10,000 Units by mouth daily.    . diazepam (VALIUM) 5 MG tablet take 1 tablet by mouth every 6 hours if needed for anxiety (SPASMS) 30 tablet 1  . diclofenac (FLECTOR) 1.3 % PTCH Place 1 patch onto the skin 2 (two) times daily. 30 patch 0  . gabapentin (NEURONTIN) 300 MG capsule Take 1 capsule (300 mg total) by mouth 3 (three) times daily. 90 capsule 3  . Meloxicam (MOBIC PO) Take by mouth.    . montelukast (SINGULAIR) 10 MG tablet take 1 tablet by mouth once daily 30 tablet 1   No current facility-administered medications on file prior to visit.   Medical History:  Past Medical History  Diagnosis Date  . Anemia   . Abnormal cells of cervix     PAP  . Anxiety   . Insomnia   . Unspecified vitamin D deficiency   . ADD (attention deficit disorder)   . Migraines   . Allergy   . Chronic lumbar pain     Sacrum   Allergies:  Allergies  Allergen Reactions  . Percocet [Oxycodone-Acetaminophen]      Review of Systems:  Review of Systems  Constitutional: Positive for weight loss (7 lbs, trying) and malaise/fatigue. Negative for fever, chills and diaphoresis.  Eyes: Negative.   Respiratory: Negative.   Cardiovascular: Negative.   Gastrointestinal: Negative.   Genitourinary: Negative.   Musculoskeletal: Positive for back pain, joint pain and neck pain. Negative for myalgias and falls.  Skin: Negative.   Neurological: Positive for sensory change (bilateral lower back/buttocks, unchanged). Negative for dizziness, tingling, tremors, speech change, focal weakness, seizures, loss of consciousness and weakness.  Psychiatric/Behavioral:  Negative for depression, suicidal ideas, hallucinations, memory loss and substance abuse. The patient is nervous/anxious and has insomnia.     Family history- Review and unchanged Social history- Review and unchanged Physical Exam: BP 108/50 mmHg  Pulse 67  Temp(Src) 97.7 F (36.5 C) (Temporal)  Resp 16  Ht 5' 4.5" (1.638 m)  Wt 152 lb (68.947 kg)  BMI 25.70 kg/m2  SpO2 97% Wt Readings from Last 3 Encounters:  12/29/15 152 lb (68.947 kg)  10/15/15 159 lb (72.122 kg)  09/10/15 152 lb (68.947 kg)   General Appearance: Well nourished, in no apparent distress. Eyes: PERRLA, EOMs, conjunctiva no swelling or erythema Sinuses: No Frontal/maxillary tenderness ENT/Mouth: Ext aud canals clear, TMs without erythema, bulging. No erythema, swelling, or exudate on post pharynx.  Tonsils not swollen or erythematous. Hearing normal.  Neck: Supple, thyroid normal.  Respiratory: Respiratory effort normal, BS equal bilaterally without rales, rhonchi, wheezing or stridor.  Cardio: RRR with no MRGs. Brisk peripheral pulses without edema.  Abdomen: Soft, + BS,  Non tender, no guarding, rebound, hernias, masses. Lymphatics: Non tender without lymphadenopathy.  Musculoskeletal: Full ROM, 5/5 strength, Normal gait Skin: Warm, dry without rashes, lesions, ecchymosis.  Neuro: Cranial nerves intact. Normal muscle tone, no cerebellar symptoms. Psych: Awake and oriented X 3, normal affect, Insight and Judgment appropriate.    Vicie Mutters, PA-C 9:56 AM [facility]

## 2015-12-30 LAB — LIPID PANEL
CHOLESTEROL: 174 mg/dL (ref 125–200)
HDL: 59 mg/dL (ref >=46)
LDL CALC: 99 mg/dL (ref <130)
Total CHOL/HDL Ratio: 2.9 Ratio (ref <=5.0)
Triglycerides: 82 mg/dL (ref <150)
VLDL: 16 mg/dL (ref <30)

## 2015-12-30 LAB — URINALYSIS, ROUTINE W REFLEX MICROSCOPIC
Bilirubin Urine: NEGATIVE
Glucose, UA: NEGATIVE
Hgb urine dipstick: NEGATIVE
Ketones, ur: NEGATIVE
LEUKOCYTES UA: NEGATIVE
NITRITE: NEGATIVE
Protein, ur: NEGATIVE
SPECIFIC GRAVITY, URINE: 1.009 (ref 1.001–1.035)
pH: 6 (ref 5.0–8.0)

## 2015-12-30 LAB — HEPATIC FUNCTION PANEL
ALBUMIN: 4.5 g/dL (ref 3.6–5.1)
ALT: 17 U/L (ref 6–29)
AST: 16 U/L (ref 10–35)
Alkaline Phosphatase: 52 U/L (ref 33–130)
Bilirubin, Direct: 0.1 mg/dL (ref <=0.2)
Indirect Bilirubin: 0.5 mg/dL (ref 0.2–1.2)
Total Bilirubin: 0.6 mg/dL (ref 0.2–1.2)
Total Protein: 6.7 g/dL (ref 6.1–8.1)

## 2015-12-30 LAB — MICROALBUMIN / CREATININE URINE RATIO: Creatinine, Urine: 45 mg/dL (ref 20–320)

## 2015-12-30 LAB — BASIC METABOLIC PANEL WITH GFR
BUN: 14 mg/dL (ref 7–25)
CO2: 29 mmol/L (ref 20–31)
CREATININE: 0.96 mg/dL (ref 0.50–1.05)
Calcium: 9.3 mg/dL (ref 8.6–10.4)
Chloride: 101 mmol/L (ref 98–110)
GFR, EST AFRICAN AMERICAN: 80 mL/min (ref >=60)
GFR, Est Non African American: 69 mL/min (ref >=60)
GLUCOSE: 85 mg/dL (ref 65–99)
Potassium: 4.2 mmol/L (ref 3.5–5.3)
Sodium: 140 mmol/L (ref 135–146)

## 2015-12-30 LAB — IRON AND TIBC
%SAT: 69 % — ABNORMAL HIGH (ref 11–50)
IRON: 195 ug/dL — AB (ref 45–160)
TIBC: 283 ug/dL (ref 250–450)
UIBC: 88 ug/dL — AB (ref 125–400)

## 2015-12-30 LAB — MAGNESIUM: Magnesium: 2.2 mg/dL (ref 1.5–2.5)

## 2015-12-30 LAB — THYROID PEROXIDASE ANTIBODY: Thyroperoxidase Ab SerPl-aCnc: 1 IU/mL (ref <9)

## 2015-12-30 LAB — INSULIN, FASTING: Insulin fasting, serum: 8.6 u[IU]/mL (ref 2.0–19.6)

## 2015-12-30 LAB — VITAMIN D 25 HYDROXY (VIT D DEFICIENCY, FRACTURES): Vit D, 25-Hydroxy: 101 ng/mL — ABNORMAL HIGH (ref 30–100)

## 2015-12-30 LAB — FERRITIN: Ferritin: 47 ng/mL (ref 10–232)

## 2015-12-30 LAB — TSH: TSH: 1.8 m[IU]/L

## 2015-12-30 LAB — VITAMIN B12: Vitamin B-12: 617 pg/mL (ref 200–1100)

## 2016-01-14 HISTORY — PX: SHOULDER ADHESION RELEASE: SHX773

## 2016-01-28 ENCOUNTER — Other Ambulatory Visit: Payer: Self-pay | Admitting: Physician Assistant

## 2016-01-28 ENCOUNTER — Other Ambulatory Visit: Payer: 59

## 2016-01-28 DIAGNOSIS — D649 Anemia, unspecified: Secondary | ICD-10-CM

## 2016-01-28 LAB — CBC WITH DIFFERENTIAL/PLATELET
BASOS ABS: 80 {cells}/uL (ref 0–200)
Basophils Relative: 1 %
EOS ABS: 80 {cells}/uL (ref 15–500)
EOS PCT: 1 %
HCT: 37.9 % (ref 35.0–45.0)
Hemoglobin: 12.5 g/dL (ref 11.7–15.5)
LYMPHS PCT: 34 %
Lymphs Abs: 2720 cells/uL (ref 850–3900)
MCH: 29.4 pg (ref 27.0–33.0)
MCHC: 33 g/dL (ref 32.0–36.0)
MCV: 89.2 fL (ref 80.0–100.0)
MONOS PCT: 6 %
MPV: 8.7 fL (ref 7.5–12.5)
Monocytes Absolute: 480 cells/uL (ref 200–950)
Neutro Abs: 4640 cells/uL (ref 1500–7800)
Neutrophils Relative %: 58 %
PLATELETS: 358 10*3/uL (ref 140–400)
RBC: 4.25 MIL/uL (ref 3.80–5.10)
RDW: 13.1 % (ref 11.0–15.0)
WBC: 8 10*3/uL (ref 3.8–10.8)

## 2016-01-29 ENCOUNTER — Other Ambulatory Visit: Payer: Self-pay

## 2016-01-29 LAB — IRON AND TIBC
%SAT: 32 % (ref 11–50)
IRON: 88 ug/dL (ref 45–160)
TIBC: 274 ug/dL (ref 250–450)
UIBC: 186 ug/dL (ref 125–400)

## 2016-01-29 LAB — FOLATE RBC: RBC Folate: 543 ng/mL (ref >280)

## 2016-01-29 LAB — FERRITIN: Ferritin: 45 ng/mL (ref 10–232)

## 2016-02-21 ENCOUNTER — Encounter: Payer: Self-pay | Admitting: *Deleted

## 2016-02-22 ENCOUNTER — Other Ambulatory Visit: Payer: Self-pay | Admitting: Internal Medicine

## 2016-03-04 ENCOUNTER — Other Ambulatory Visit: Payer: Self-pay | Admitting: Physician Assistant

## 2016-03-04 MED ORDER — BUPROPION HCL ER (XL) 150 MG PO TB24: 150.0000 mg | ORAL_TABLET | ORAL | Status: DC

## 2016-03-04 MED ORDER — CLONAZEPAM 1 MG PO TABS: ORAL_TABLET | ORAL | Status: DC

## 2016-03-04 MED ORDER — ALPRAZOLAM 1 MG PO TABS: 1.0000 mg | ORAL_TABLET | Freq: Three times a day (TID) | ORAL | Status: DC | PRN

## 2016-03-18 ENCOUNTER — Ambulatory Visit: Payer: 59

## 2016-03-30 ENCOUNTER — Ambulatory Visit
Admission: RE | Admit: 2016-03-30 | Discharge: 2016-03-30 | Disposition: A | Discharge status: Home / Self Care | Payer: 59 | Source: Ambulatory Visit

## 2016-03-30 DIAGNOSIS — Z1231 Encounter for screening mammogram for malignant neoplasm of breast: Secondary | ICD-10-CM

## 2016-04-05 ENCOUNTER — Other Ambulatory Visit: Payer: Self-pay | Admitting: Obstetrics & Gynecology

## 2016-04-05 DIAGNOSIS — R928 Other abnormal and inconclusive findings on diagnostic imaging of breast: Secondary | ICD-10-CM

## 2016-04-08 ENCOUNTER — Ambulatory Visit
Admission: RE | Admit: 2016-04-08 | Discharge: 2016-04-08 | Disposition: A | Discharge status: Home / Self Care | Payer: 59 | Source: Ambulatory Visit | Attending: Obstetrics & Gynecology | Admitting: Obstetrics & Gynecology

## 2016-04-08 ENCOUNTER — Other Ambulatory Visit: Payer: Self-pay | Admitting: Obstetrics & Gynecology

## 2016-04-08 DIAGNOSIS — R928 Other abnormal and inconclusive findings on diagnostic imaging of breast: Secondary | ICD-10-CM

## 2016-04-13 ENCOUNTER — Other Ambulatory Visit: Payer: Self-pay | Admitting: Obstetrics & Gynecology

## 2016-04-13 DIAGNOSIS — R928 Other abnormal and inconclusive findings on diagnostic imaging of breast: Secondary | ICD-10-CM

## 2016-04-14 ENCOUNTER — Other Ambulatory Visit: Payer: Self-pay | Admitting: Internal Medicine

## 2016-04-14 ENCOUNTER — Ambulatory Visit
Admission: RE | Admit: 2016-04-14 | Discharge: 2016-04-14 | Disposition: A | Discharge status: Home / Self Care | Payer: 59 | Source: Ambulatory Visit | Attending: Obstetrics & Gynecology | Admitting: Obstetrics & Gynecology

## 2016-04-14 DIAGNOSIS — R928 Other abnormal and inconclusive findings on diagnostic imaging of breast: Secondary | ICD-10-CM

## 2016-04-15 ENCOUNTER — Other Ambulatory Visit: Payer: Self-pay | Admitting: Physician Assistant

## 2016-04-15 MED ORDER — TRAZODONE HCL 50 MG PO TABS: ORAL_TABLET | ORAL | 2 refills | Status: DC

## 2016-04-15 MED ORDER — BUPROPION HCL ER (XL) 300 MG PO TB24: 300.0000 mg | ORAL_TABLET | ORAL | 1 refills | Status: DC

## 2016-04-20 ENCOUNTER — Other Ambulatory Visit: Payer: Self-pay | Admitting: Physician Assistant

## 2016-04-20 MED ORDER — AMPHETAMINE-DEXTROAMPHETAMINE 30 MG PO TABS: 30.0000 mg | ORAL_TABLET | Freq: Every day | ORAL | 0 refills | Status: DC

## 2016-05-03 ENCOUNTER — Other Ambulatory Visit: Payer: Self-pay | Admitting: Internal Medicine

## 2016-05-10 ENCOUNTER — Other Ambulatory Visit: Payer: Self-pay | Admitting: Physician Assistant

## 2016-06-03 ENCOUNTER — Other Ambulatory Visit: Payer: Self-pay | Admitting: Physician Assistant

## 2016-06-16 ENCOUNTER — Other Ambulatory Visit: Payer: Self-pay | Admitting: Physician Assistant

## 2016-06-16 MED ORDER — AMPHETAMINE-DEXTROAMPHETAMINE 30 MG PO TABS: 30.0000 mg | ORAL_TABLET | Freq: Every day | ORAL | 0 refills | Status: DC

## 2016-06-16 MED ORDER — ESZOPICLONE 3 MG PO TABS: 3.0000 mg | ORAL_TABLET | Freq: Every day | ORAL | 0 refills | Status: DC

## 2016-07-01 ENCOUNTER — Other Ambulatory Visit: Payer: Self-pay | Admitting: Physician Assistant

## 2016-07-01 MED ORDER — HYDROCORTISONE-ACETIC ACID 1-2 % OT SOLN: 3.0000 [drp] | Freq: Two times a day (BID) | OTIC | 1 refills | Status: DC

## 2016-07-22 ENCOUNTER — Encounter: Payer: Self-pay | Admitting: Physician Assistant

## 2016-07-22 DIAGNOSIS — T7840XD Allergy, unspecified, subsequent encounter: Secondary | ICD-10-CM

## 2016-07-22 MED ORDER — CIPROFLOXACIN HCL 500 MG PO TABS: 500.0000 mg | ORAL_TABLET | Freq: Two times a day (BID) | ORAL | 0 refills | Status: DC

## 2016-07-22 MED ORDER — ESZOPICLONE 3 MG PO TABS: 3.0000 mg | ORAL_TABLET | Freq: Every day | ORAL | 5 refills | Status: DC

## 2016-07-22 MED ORDER — AZITHROMYCIN 250 MG PO TABS: ORAL_TABLET | ORAL | 0 refills | Status: DC

## 2016-07-22 MED ORDER — CYCLOBENZAPRINE HCL 10 MG PO TABS: 10.0000 mg | ORAL_TABLET | Freq: Three times a day (TID) | ORAL | 1 refills | Status: DC | PRN

## 2016-07-22 MED ORDER — MECLIZINE HCL 25 MG PO TABS: ORAL_TABLET | ORAL | 0 refills | Status: DC

## 2016-07-22 MED ORDER — BUPROPION HCL ER (XL) 150 MG PO TB24: 150.0000 mg | ORAL_TABLET | ORAL | 2 refills | Status: DC

## 2016-07-22 MED ORDER — PREDNISONE 10 MG PO TABS: 10.0000 mg | ORAL_TABLET | ORAL | 0 refills | Status: DC

## 2016-07-22 MED ORDER — ROPINIROLE HCL 1 MG PO TABS: ORAL_TABLET | ORAL | 3 refills | Status: DC

## 2016-07-22 MED ORDER — ONDANSETRON HCL 4 MG PO TABS: 4.0000 mg | ORAL_TABLET | Freq: Every day | ORAL | 1 refills | Status: DC | PRN

## 2016-07-24 ENCOUNTER — Other Ambulatory Visit: Payer: Self-pay | Admitting: Physician Assistant

## 2016-07-25 ENCOUNTER — Encounter: Payer: Self-pay | Admitting: Physician Assistant

## 2016-07-25 ENCOUNTER — Ambulatory Visit (INDEPENDENT_AMBULATORY_CARE_PROVIDER_SITE_OTHER): Payer: 59 | Admitting: Physician Assistant

## 2016-07-25 VITALS — BP 126/74 | HR 85 | Temp 97.9°F | Resp 16 | Wt 147.0 lb

## 2016-07-25 DIAGNOSIS — E559 Vitamin D deficiency, unspecified: Secondary | ICD-10-CM | POA: Diagnosis not present

## 2016-07-25 DIAGNOSIS — Z79899 Other long term (current) drug therapy: Secondary | ICD-10-CM

## 2016-07-25 DIAGNOSIS — R5383 Other fatigue: Secondary | ICD-10-CM | POA: Diagnosis not present

## 2016-07-25 LAB — CBC WITH DIFFERENTIAL/PLATELET
BASOS ABS: 0 {cells}/uL (ref 0–200)
Basophils Relative: 0 %
EOS PCT: 0 %
Eosinophils Absolute: 0 cells/uL — ABNORMAL LOW (ref 15–500)
HEMATOCRIT: 41.3 % (ref 35.0–45.0)
HEMOGLOBIN: 13.7 g/dL (ref 11.7–15.5)
LYMPHS ABS: 2380 {cells}/uL (ref 850–3900)
Lymphocytes Relative: 35 %
MCH: 29.3 pg (ref 27.0–33.0)
MCHC: 33.2 g/dL (ref 32.0–36.0)
MCV: 88.2 fL (ref 80.0–100.0)
MONO ABS: 612 {cells}/uL (ref 200–950)
MPV: 8.7 fL (ref 7.5–12.5)
Monocytes Relative: 9 %
NEUTROS ABS: 3808 {cells}/uL (ref 1500–7800)
Neutrophils Relative %: 56 %
Platelets: 387 10*3/uL (ref 140–400)
RBC: 4.68 MIL/uL (ref 3.80–5.10)
RDW: 13.2 % (ref 11.0–15.0)
WBC: 6.8 10*3/uL (ref 3.8–10.8)

## 2016-07-25 LAB — TSH: TSH: 2.96 mIU/L

## 2016-07-25 MED ORDER — ESTROGENS, CONJUGATED 0.625 MG/GM VA CREA: 1.0000 | TOPICAL_CREAM | Freq: Every day | VAGINAL | 12 refills | Status: DC

## 2016-07-25 MED ORDER — ESCITALOPRAM OXALATE 20 MG PO TABS: 20.0000 mg | ORAL_TABLET | Freq: Every day | ORAL | 2 refills | Status: DC

## 2016-07-25 MED ORDER — ALPRAZOLAM 0.5 MG PO TABS: 0.5000 mg | ORAL_TABLET | Freq: Three times a day (TID) | ORAL | 0 refills | Status: DC | PRN

## 2016-07-25 NOTE — Patient Instructions (Addendum)
Your ears and sinuses are connected by the eustachian tube. When your sinuses are inflamed, this can close off the tube and cause fluid to collect in your middle ear. This can then cause dizziness, popping, clicking, ringing, and echoing in your ears. This is often NOT an infection and does NOT require antibiotics, it is caused by inflammation so the treatments help the inflammation. This can take a long time to get better so please be patient.  Here are things you can do to help with this: - Try the Flonase or Nasonex. Remember to spray each nostril twice towards the outer part of your eye.  Do not sniff but instead pinch your nose and tilt your head back to help the medicine get into your sinuses.  The best time to do this is at bedtime.Stop if you get blurred vision or nose bleeds.  -While drinking fluids, pinch and hold nose close and swallow, to help open eustachian tubes to drain fluid behind ear drums. -Please pick one of the over the counter allergy medications below and take it once daily for allergies.  It will also help with fluid behind ear drums. Claritin or loratadine cheapest but likely the weakest  Zyrtec or certizine at night because it can make you sleepy The strongest is allegra or fexafinadine  Cheapest at [employer], [employer], [employer] -can use decongestant over the counter, please do not use if you have high blood pressure or certain heart conditions.   if worsening HA, changes vision/speech, imbalance, weakness go to the ER   Only take 1 allergy pill not 2 a day Get on nasal spray Try the lexapro 10mg , take 1/2  Do the estrogen cream daily x 2 weeks and then 1-2 x a week.

## 2016-07-25 NOTE — Progress Notes (Signed)
Assessment and Plan:  1. Medication management - CBC with Differential/Platelet - BASIC METABOLIC PANEL WITH GFR - Hepatic function panel - Magnesium  2. Vitamin D deficiency - VITAMIN D 25 Hydroxy (Vit-D Deficiency, Fractures)  3. Other fatigue And anxiety, check labs, likely from menopause, will start lexapro, xanax PRN - TSH   Continue diet and meds as discussed. Further disposition pending results of labs. Over 30 minutes of exam, counseling, chart review, and critical decision making was performed  No future appointments.   HPI 49 y.o. female  presents for 3 month follow up on hypertension, cholesterol, prediabetes, and vitamin D deficiency.   Her blood pressure has been controlled at home, today their BP is BP: 126/74  She does not workout. She denies chest pain, shortness of breath, dizziness. She was very stressed, things have calmed down however she has been very short tempered, hot flashes, vaginal dryness.    She is not on cholesterol medication and denies myalgias. Her cholesterol is at goal. The cholesterol last visit was:   Lab Results  Component Value Date   CHOL 174 12/29/2015   HDL 59 12/29/2015   LDLCALC 99 12/29/2015   TRIG 82 12/29/2015   CHOLHDL 2.9 12/29/2015    She has been working on diet and exercise for prediabetes, and denies paresthesia of the feet, polydipsia, polyuria and visual disturbances. Last A1C in the office was:  Lab Results  Component Value Date   HGBA1C 5.2 12/29/2015   Patient is on Vitamin D supplement.   Lab Results  Component Value Date   VD25OH 101 (H) 12/29/2015     BMI is Body mass index is 24.84 kg/m., she is working on diet and exercise. Wt Readings from Last 3 Encounters:  07/25/16 147 lb (66.7 kg)  12/29/15 152 lb (68.9 kg)  10/15/15 159 lb (72.1 kg)    Current Medications:  Current Outpatient Prescriptions on File Prior to Visit  Medication Sig  . amphetamine-dextroamphetamine (ADDERALL) 30 MG tablet Take 1  tablet by mouth daily.  . Eszopiclone 3 MG TABS Take 1 tablet (3 mg total) by mouth at bedtime. Take immediately before bedtime  . gabapentin (NEURONTIN) 300 MG capsule Take 1 capsule (300 mg total) by mouth 3 (three) times daily.  . montelukast (SINGULAIR) 10 MG tablet take 1 tablet by mouth once daily  . rOPINIRole (REQUIP) 1 MG tablet take 1 to 2 tablets by mouth at bedtime for legs   No current facility-administered medications on file prior to visit.     Medical History:  Past Medical History:  Diagnosis Date  . Abnormal cells of cervix    PAP  . ADD (attention deficit disorder)   . Allergy   . Anemia   . Anxiety   . Chronic lumbar pain    Sacrum  . Insomnia   . Migraines   . Unspecified vitamin D deficiency    Allergies:  Allergies  Allergen Reactions  . Percocet [Oxycodone-Acetaminophen]      Review of Systems:  ROS  Family history- Review and unchanged Social history- Review and unchanged Physical Exam: BP 126/74   Pulse 85   Temp 97.9 F (36.6 C)   Resp 16   Wt 147 lb (66.7 kg)   SpO2 95%   BMI 24.84 kg/m  Wt Readings from Last 3 Encounters:  07/25/16 147 lb (66.7 kg)  12/29/15 152 lb (68.9 kg)  10/15/15 159 lb (72.1 kg)   General Appearance: Well nourished, in no apparent distress. Eyes:  PERRLA, EOMs, conjunctiva no swelling or erythema Sinuses: No Frontal/maxillary tenderness ENT/Mouth: Ext aud canals clear, TMs without erythema, bulging. No erythema, swelling, or exudate on post pharynx.  Tonsils not swollen or erythematous. Hearing normal.  Neck: Supple, thyroid normal.  Respiratory: Respiratory effort normal, BS equal bilaterally without rales, rhonchi, wheezing or stridor.  Cardio: RRR with no MRGs. Brisk peripheral pulses without edema.  Abdomen: Soft, + BS,  Non tender, no guarding, rebound, hernias, masses. Lymphatics: Non tender without lymphadenopathy.  Musculoskeletal: Full ROM, 5/5 strength, Normal gait Skin: Warm, dry without rashes,  lesions, ecchymosis.  Neuro: Cranial nerves intact. Normal muscle tone, no cerebellar symptoms. Psych: Awake and oriented X 3, normal affect, Insight and Judgment appropriate.    Vicie Mutters, PA-C 4:17 PM [facility]

## 2016-07-26 LAB — HEPATIC FUNCTION PANEL
ALT: 21 U/L (ref 6–29)
AST: 19 U/L (ref 10–35)
Albumin: 4.6 g/dL (ref 3.6–5.1)
Alkaline Phosphatase: 72 U/L (ref 33–130)
BILIRUBIN DIRECT: 0.1 mg/dL (ref <=0.2)
Indirect Bilirubin: 0.3 mg/dL (ref 0.2–1.2)
Total Bilirubin: 0.4 mg/dL (ref 0.2–1.2)
Total Protein: 6.9 g/dL (ref 6.1–8.1)

## 2016-07-26 LAB — BASIC METABOLIC PANEL WITH GFR
BUN: 12 mg/dL (ref 7–25)
CHLORIDE: 101 mmol/L (ref 98–110)
CO2: 29 mmol/L (ref 20–31)
Calcium: 9.7 mg/dL (ref 8.6–10.4)
Creat: 1.03 mg/dL (ref 0.50–1.05)
GFR, EST NON AFRICAN AMERICAN: 63 mL/min (ref >=60)
GFR, Est African American: 73 mL/min (ref >=60)
Glucose, Bld: 83 mg/dL (ref 65–99)
Potassium: 4.1 mmol/L (ref 3.5–5.3)
SODIUM: 140 mmol/L (ref 135–146)

## 2016-07-26 LAB — VITAMIN D 25 HYDROXY (VIT D DEFICIENCY, FRACTURES): Vit D, 25-Hydroxy: 132 ng/mL — ABNORMAL HIGH (ref 30–100)

## 2016-07-26 LAB — MAGNESIUM: Magnesium: 2.2 mg/dL (ref 1.5–2.5)

## 2016-08-03 ENCOUNTER — Other Ambulatory Visit: Payer: Self-pay | Admitting: Physician Assistant

## 2016-08-15 HISTORY — PX: BREAST BIOPSY: SHX20

## 2016-08-19 ENCOUNTER — Encounter: Payer: Self-pay | Admitting: *Deleted

## 2016-08-23 ENCOUNTER — Other Ambulatory Visit: Payer: Self-pay | Admitting: Physician Assistant

## 2016-08-23 ENCOUNTER — Encounter: Payer: Self-pay | Admitting: Physician Assistant

## 2016-08-23 MED ORDER — AMPHETAMINE-DEXTROAMPHETAMINE 30 MG PO TABS: 30.0000 mg | ORAL_TABLET | Freq: Every day | ORAL | 0 refills | Status: DC

## 2016-08-23 MED ORDER — AZELASTINE HCL 0.1 % NA SOLN: 2.0000 | Freq: Two times a day (BID) | NASAL | 2 refills | Status: DC

## 2016-08-23 MED ORDER — AZELASTINE-FLUTICASONE 137-50 MCG/ACT NA SUSP: NASAL | 3 refills | Status: DC

## 2016-09-14 ENCOUNTER — Other Ambulatory Visit: Payer: Self-pay | Admitting: Internal Medicine

## 2016-09-14 MED ORDER — NEOMYCIN-POLYMYXIN-DEXAMETH 3.5-10000-0.1 OP SUSP: OPHTHALMIC | 1 refills | Status: DC

## 2016-09-16 ENCOUNTER — Other Ambulatory Visit: Payer: Self-pay | Admitting: Internal Medicine

## 2016-09-16 MED ORDER — DOXYCYCLINE HYCLATE 100 MG PO CAPS: 100.0000 mg | ORAL_CAPSULE | Freq: Two times a day (BID) | ORAL | 1 refills | Status: DC

## 2016-09-19 ENCOUNTER — Other Ambulatory Visit: Payer: Self-pay | Admitting: Physician Assistant

## 2016-10-17 ENCOUNTER — Other Ambulatory Visit: Payer: Self-pay | Admitting: Physician Assistant

## 2016-12-06 ENCOUNTER — Encounter: Payer: Self-pay | Admitting: Physician Assistant

## 2016-12-06 MED ORDER — AMPHETAMINE-DEXTROAMPHETAMINE 30 MG PO TABS: 30.0000 mg | ORAL_TABLET | Freq: Every day | ORAL | 0 refills | Status: DC

## 2016-12-15 ENCOUNTER — Other Ambulatory Visit: Payer: Self-pay | Admitting: Internal Medicine

## 2016-12-15 DIAGNOSIS — M26622 Arthralgia of left temporomandibular joint: Secondary | ICD-10-CM

## 2016-12-15 MED ORDER — PREDNISONE 20 MG PO TABS: ORAL_TABLET | ORAL | 0 refills | Status: AC

## 2016-12-16 ENCOUNTER — Other Ambulatory Visit: Payer: Self-pay | Admitting: Physician Assistant

## 2016-12-30 ENCOUNTER — Other Ambulatory Visit: Payer: Self-pay | Admitting: Physician Assistant

## 2016-12-30 MED ORDER — FIRST-DUKES MOUTHWASH MT SUSP: OROMUCOSAL | 1 refills | Status: DC

## 2017-01-02 ENCOUNTER — Encounter: Payer: Self-pay | Admitting: Physician Assistant

## 2017-01-02 DIAGNOSIS — G8929 Other chronic pain: Secondary | ICD-10-CM

## 2017-01-02 DIAGNOSIS — R6884 Jaw pain: Principal | ICD-10-CM

## 2017-01-03 ENCOUNTER — Ambulatory Visit (HOSPITAL_COMMUNITY)
Admission: RE | Admit: 2017-01-03 | Discharge: 2017-01-03 | Disposition: A | Discharge status: Home / Self Care | Payer: 59 | Source: Ambulatory Visit | Attending: Physician Assistant | Admitting: Physician Assistant

## 2017-01-03 DIAGNOSIS — G8929 Other chronic pain: Secondary | ICD-10-CM | POA: Diagnosis not present

## 2017-01-03 DIAGNOSIS — R6884 Jaw pain: Secondary | ICD-10-CM | POA: Diagnosis present

## 2017-01-23 ENCOUNTER — Other Ambulatory Visit: Payer: Self-pay | Admitting: Internal Medicine

## 2017-02-21 ENCOUNTER — Other Ambulatory Visit: Payer: 59

## 2017-02-21 ENCOUNTER — Other Ambulatory Visit: Payer: Self-pay | Admitting: Physician Assistant

## 2017-02-21 DIAGNOSIS — Z79899 Other long term (current) drug therapy: Secondary | ICD-10-CM

## 2017-02-21 DIAGNOSIS — D649 Anemia, unspecified: Secondary | ICD-10-CM

## 2017-02-21 DIAGNOSIS — R5383 Other fatigue: Secondary | ICD-10-CM

## 2017-02-21 DIAGNOSIS — E559 Vitamin D deficiency, unspecified: Secondary | ICD-10-CM

## 2017-02-22 LAB — HEPATIC FUNCTION PANEL
ALBUMIN: 4.3 g/dL (ref 3.6–5.1)
ALT: 15 U/L (ref 6–29)
AST: 13 U/L (ref 10–35)
Alkaline Phosphatase: 77 U/L (ref 33–130)
BILIRUBIN DIRECT: 0.1 mg/dL (ref <=0.2)
Indirect Bilirubin: 0.2 mg/dL (ref 0.2–1.2)
Total Bilirubin: 0.3 mg/dL (ref 0.2–1.2)
Total Protein: 6.5 g/dL (ref 6.1–8.1)

## 2017-02-22 LAB — FERRITIN: FERRITIN: 39 ng/mL (ref 10–232)

## 2017-02-22 LAB — SEDIMENTATION RATE: SED RATE: 4 mm/h (ref 0–30)

## 2017-02-22 LAB — CBC WITH DIFFERENTIAL/PLATELET
BASOS PCT: 0 %
Basophils Absolute: 0 cells/uL (ref 0–200)
EOS PCT: 1 %
Eosinophils Absolute: 91 cells/uL (ref 15–500)
HCT: 38.6 % (ref 35.0–45.0)
Hemoglobin: 12.6 g/dL (ref 11.7–15.5)
LYMPHS PCT: 43 %
Lymphs Abs: 3913 cells/uL — ABNORMAL HIGH (ref 850–3900)
MCH: 29.4 pg (ref 27.0–33.0)
MCHC: 32.6 g/dL (ref 32.0–36.0)
MCV: 90 fL (ref 80.0–100.0)
MONOS PCT: 8 %
MPV: 8.1 fL (ref 7.5–12.5)
Monocytes Absolute: 728 cells/uL (ref 200–950)
NEUTROS ABS: 4368 {cells}/uL (ref 1500–7800)
Neutrophils Relative %: 48 %
PLATELETS: 416 10*3/uL — AB (ref 140–400)
RBC: 4.29 MIL/uL (ref 3.80–5.10)
RDW: 13.5 % (ref 11.0–15.0)
WBC: 9.1 10*3/uL (ref 3.8–10.8)

## 2017-02-22 LAB — BASIC METABOLIC PANEL WITH GFR
BUN: 19 mg/dL (ref 7–25)
CHLORIDE: 101 mmol/L (ref 98–110)
CO2: 25 mmol/L (ref 20–31)
CREATININE: 0.89 mg/dL (ref 0.50–1.05)
Calcium: 9.3 mg/dL (ref 8.6–10.4)
GFR, EST AFRICAN AMERICAN: 87 mL/min (ref >=60)
GFR, Est Non African American: 75 mL/min (ref >=60)
Glucose, Bld: 88 mg/dL (ref 65–99)
POTASSIUM: 4.2 mmol/L (ref 3.5–5.3)
SODIUM: 140 mmol/L (ref 135–146)

## 2017-02-22 LAB — FOLATE RBC: RBC Folate: 822 ng/mL (ref >280)

## 2017-02-22 LAB — VITAMIN D 25 HYDROXY (VIT D DEFICIENCY, FRACTURES): Vit D, 25-Hydroxy: 61 ng/mL (ref 30–100)

## 2017-02-22 LAB — IRON AND TIBC
%SAT: 15 % (ref 11–50)
IRON: 39 ug/dL — AB (ref 45–160)
TIBC: 267 ug/dL (ref 250–450)
UIBC: 228 ug/dL

## 2017-02-22 LAB — TESTOSTERONE

## 2017-02-22 LAB — MAGNESIUM: Magnesium: 2.1 mg/dL (ref 1.5–2.5)

## 2017-02-22 LAB — TSH: TSH: 1.89 m[IU]/L

## 2017-02-23 LAB — METHYLMALONIC ACID, SERUM: Methylmalonic Acid, Quant: 139 nmol/L (ref 87–318)

## 2017-02-23 LAB — ZINC: Zinc: 58 ug/dL — ABNORMAL LOW (ref 60–130)

## 2017-02-25 LAB — ESTROGENS, TOTAL: ESTROGEN: 43.2 pg/mL — AB

## 2017-03-01 ENCOUNTER — Encounter: Payer: Self-pay | Admitting: Physician Assistant

## 2017-03-03 ENCOUNTER — Other Ambulatory Visit: Payer: Self-pay | Admitting: Physician Assistant

## 2017-03-03 MED ORDER — ESTRADIOL 0.0375 MG/24HR TD PTWK: MEDICATED_PATCH | TRANSDERMAL | 8 refills | Status: DC

## 2017-03-03 MED ORDER — ESTROGENS, CONJUGATED 0.625 MG/GM VA CREA: 1.0000 | TOPICAL_CREAM | Freq: Every day | VAGINAL | 12 refills | Status: DC

## 2017-03-03 MED ORDER — PROGESTERONE MICRONIZED 100 MG PO CAPS: ORAL_CAPSULE | ORAL | 2 refills | Status: DC

## 2017-03-07 ENCOUNTER — Other Ambulatory Visit: Payer: Self-pay | Admitting: Internal Medicine

## 2017-03-13 ENCOUNTER — Encounter: Payer: Self-pay | Admitting: Physician Assistant

## 2017-03-14 MED ORDER — DOXEPIN HCL 10 MG PO CAPS: 10.0000 mg | ORAL_CAPSULE | Freq: Every evening | ORAL | 5 refills | Status: DC | PRN

## 2017-03-14 MED ORDER — AMPHETAMINE-DEXTROAMPHETAMINE 30 MG PO TABS: 30.0000 mg | ORAL_TABLET | Freq: Every day | ORAL | 0 refills | Status: DC

## 2017-04-04 ENCOUNTER — Encounter: Payer: Self-pay | Admitting: Physician Assistant

## 2017-04-04 ENCOUNTER — Other Ambulatory Visit: Payer: Self-pay | Admitting: Physician Assistant

## 2017-04-04 MED ORDER — ESTRADIOL 0.1 MG/24HR TD PTWK: MEDICATED_PATCH | TRANSDERMAL | 3 refills | Status: DC

## 2017-04-04 MED ORDER — FLUCONAZOLE 150 MG PO TABS: 150.0000 mg | ORAL_TABLET | Freq: Every day | ORAL | 3 refills | Status: DC

## 2017-04-06 ENCOUNTER — Other Ambulatory Visit: Payer: Self-pay | Admitting: Internal Medicine

## 2017-05-05 ENCOUNTER — Other Ambulatory Visit: Payer: Self-pay | Admitting: Internal Medicine

## 2017-05-05 DIAGNOSIS — T7840XS Allergy, unspecified, sequela: Secondary | ICD-10-CM

## 2017-05-05 MED ORDER — PREDNISONE 20 MG PO TABS: ORAL_TABLET | ORAL | 0 refills | Status: DC

## 2017-05-16 ENCOUNTER — Other Ambulatory Visit: Payer: Self-pay | Admitting: Internal Medicine

## 2017-05-16 ENCOUNTER — Encounter: Payer: Self-pay | Admitting: Physician Assistant

## 2017-05-16 MED ORDER — AMPHETAMINE-DEXTROAMPHETAMINE 30 MG PO TABS: 30.0000 mg | ORAL_TABLET | Freq: Every day | ORAL | 0 refills | Status: DC

## 2017-05-18 ENCOUNTER — Other Ambulatory Visit: Payer: Self-pay | Admitting: Orthopaedic Surgery

## 2017-05-18 DIAGNOSIS — M542 Cervicalgia: Secondary | ICD-10-CM

## 2017-05-25 ENCOUNTER — Ambulatory Visit
Admission: RE | Admit: 2017-05-25 | Discharge: 2017-05-25 | Disposition: A | Discharge status: Home / Self Care | Payer: 59 | Source: Ambulatory Visit | Attending: Orthopaedic Surgery | Admitting: Orthopaedic Surgery

## 2017-05-25 DIAGNOSIS — M542 Cervicalgia: Secondary | ICD-10-CM

## 2017-05-30 ENCOUNTER — Encounter: Payer: Self-pay | Admitting: Internal Medicine

## 2017-05-31 ENCOUNTER — Other Ambulatory Visit: Payer: 59

## 2017-06-13 ENCOUNTER — Encounter: Payer: Self-pay | Admitting: Internal Medicine

## 2017-06-19 NOTE — Progress Notes (Signed)
Complete Physical  Assessment and Plan:  Carla Fuller was seen today for annual exam.  Diagnoses and all orders for this visit:  Encounter for routine adult physical exam with abnormal findings  Attention deficit disorder (ADD) without hyperactivity Continue medications Helps with focus, no AE's. The patient was counseled on the addictive nature of the medication and was encouraged to take drug holidays when not needed.   Chronic low back pain, unspecified back pain laterality, with sciatica presence unspecified       -     Chronic tailbone pain; managed by avoidance of pressure and medications as needed. Sees ortho.   Migraine without aura and without status migrainosus, not intractable -     Possible tension/muscular component related to recent cervical issues managed by ortho -     Follow up with ortho/acupuncture for cervical facet issue -     Magnesium  Anemia, unspecified type      -      CBC, ferritin, vitamin B 12 Moderate episode of recurrent major depressive disorder (HCC) -     escitalopram (LEXAPRO) 20 MG tablet; Take 1 tablet (20 mg total) daily by mouth. -     Also discussed mindfulness, medication, relaxation techniques and cognitive behavioral therapy -     Follow up in 8-12 weeks to evaluate effect; consider prozac if insufficient  Insomnia, unspecified type       -      Moderately well managed by current medications; anticipate addressing nocturnal leg cramps will improve  Vitamin D deficiency -     VITAMIN D 25 Hydroxy (Vit-D Deficiency, Fractures)  Allergic state, sequela      -      Well managed by current medications  Nocturnal leg cramps -     BASIC METABOLIC PANEL WITH GFR -     Ambulatory referral to Vascular Surgery  Fatigue      -       Follow up labs from last year to evaluate changes       -      CRP, ESR, CBC, iron, B12, TSH  Medication management -     CBC with Differential/Platelet -     BASIC METABOLIC PANEL WITH GFR -     Hepatic function  panel  Screening cholesterol level -     Lipid panel  Screening for cardiovascular condition -     Lipid panel -     EKG 12-Lead  Screening for colon cancer -     POC Hemoccult Bld/Stl (3-Cd Home Screen); Future  Screening for deficiency anemia -     CBC with Differential/Platelet -     Iron,Total/Total Iron Binding Cap -     Vitamin B12  Screening for hematuria or proteinuria -     Urinalysis, Complete (81001)  Screening for thyroid disorder -     TSH  Screening for diabetes mellitus -     Hemoglobin A1c -     Insulin, fasting  Chronic kidney disease (CKD), stage II (mild) -     BASIC METABOLIC PANEL WITH GFR  Constipation, unspecified constipation type -     Some LLQ tenderness noted on exam with report of constipation despite daily fiber supplementation; will add dulcolax, patient to monitor tenderness and notify if this does not resolve with resolution of constipation -     bisacodyl (DULCOLAX) 5 MG EC tablet; Take 1 tablet (5 mg total) daily as needed by mouth for moderate constipation.   Discussed med's  effects and SE's. Screening labs and tests as requested with regular follow-up as recommended. Over 40 minutes of exam, counseling, chart review, and complex, high level critical decision making was performed this visit.   Future Appointments  Date Time Provider [place]  06/21/2018 10:00 AM Liane Comber, NP [facility] None    HPI  51 y.o. married Caucasian female  presents for a complete physical and follow up for has Anemia; Insomnia; Vitamin D deficiency; ADD (attention deficit disorder); Migraines; Allergy; Chronic lumbar pain; Chronic kidney disease (CKD), stage II (mild); Nocturnal leg cramps; Constipation; and Moderate episode of recurrent major depressive disorder (HCC) on their problem list.. She tearfully reports continued issues with extreme stress related to home life ongoing for several years; PHQ-9 of 9 today; patient has tried a short course  of lexapro previously but is agreeable to restarting this today. Discussed various stress reduction methods and cognitive behavioral therapy as well which patient has agreed to schedule. She reports ongoing leg/foot cramping that seems to be worse at night; she has discussed this with ortho, who suggested acupuncture and vascular follow up. She feels strongly about proceeding with vascular today. She reports she has been followed by ortho for cervical facet issues that seem to be contributing to "migraines" - possible tension headaches, triggered by stress- she reports her migraines were worked up extensively without attributable cause. She is following up for neck injections as well as acupuncture for this.   BMI is Body mass index is 27.42 kg/m., she has been working on diet and exercise. She reports weight has been a struggle in the past year.  Wt Readings from Last 3 Encounters:  06/20/17 161 lb (73 kg)  07/25/16 147 lb (66.7 kg)  12/29/15 152 lb (68.9 kg)   Today their BP is BP: 124/72   She does workout. She denies chest pain, shortness of breath, dizziness.   She is not on cholesterol medication. Her cholesterol is at goal. The cholesterol last visit was:   Lab Results  Component Value Date   CHOL 174 12/29/2015   HDL 59 12/29/2015   LDLCALC 99 12/29/2015   TRIG 82 12/29/2015   CHOLHDL 2.9 12/29/2015    Last A1C in the office was:  Lab Results  Component Value Date   HGBA1C 5.2 12/29/2015   Last GFR: Lab Results  Component Value Date   GFRNONAA 75 02/21/2017   Patient is on Vitamin D supplement and at goal:   Lab Results  Component Value Date   VD25OH 61 02/21/2017      Current Medications:  Current Outpatient Medications on File Prior to Visit  Medication Sig Dispense Refill  . amphetamine-dextroamphetamine (ADDERALL) 30 MG tablet Take 1 tablet by mouth daily. 30 tablet 0  . azelastine (ASTELIN) 0.1 % nasal spray Place 2 sprays into both nostrils 2 (two) times daily.  Use in each nostril as directed (Patient taking differently: Place 2 sprays as needed into both nostrils. Use in each nostril as directed) 30 mL 2  . Calcium-Magnesium 500-250 MG TABS Take 2 (two) times daily by mouth.    . doxepin (SINEQUAN) 10 MG capsule Take 1 capsule (10 mg total) by mouth at bedtime as needed. 30 capsule 5  . estradiol (ESTRACE) 2 MG tablet Take 2 mg daily by mouth.    . montelukast (SINGULAIR) 10 MG tablet take 1 tablet by mouth once daily 90 tablet 1  . progesterone (PROMETRIUM) 100 MG capsule Take 1 capsule daily for 10 days a  month. (Patient taking differently: Take 100 mg daily by mouth. Take 1 capsule daily for 10 days a month.) 30 capsule 2  . rOPINIRole (REQUIP) 1 MG tablet take 1 to 2 tablets by mouth at bedtime for legs 180 tablet 3  . conjugated estrogens (PREMARIN) vaginal cream Place 1 Applicatorful vaginally daily. (Patient not taking: Reported on 06/20/2017) 42.5 g 12  . cyclobenzaprine (FLEXERIL) 5 MG tablet take 1 tablet by mouth at bedtime then take 1 tablet 1 hour RPIOR TO THERAPY (Patient not taking: Reported on 06/20/2017) 20 tablet 0  . fluconazole (DIFLUCAN) 150 MG tablet Take 1 tablet (150 mg total) by mouth daily. (Patient not taking: Reported on 06/20/2017) 1 tablet 3  . gabapentin (NEURONTIN) 300 MG capsule Take 1 capsule (300 mg total) by mouth 3 (three) times daily. (Patient not taking: Reported on 06/20/2017) 90 capsule 3  . neomycin-polymyxin b-dexamethasone (MAXITROL) 3.5-10000-0.1 SUSP 1 to 2 drops to affected eye as directed (Patient not taking: Reported on 06/20/2017) 5 mL 1   No current facility-administered medications on file prior to visit.    Allergies:  Allergies  Allergen Reactions  . Percocet [Oxycodone-Acetaminophen]    Medical History:  She has Anemia; Insomnia; Vitamin D deficiency; ADD (attention deficit disorder); Migraines; Allergy; Chronic lumbar pain; Chronic kidney disease (CKD), stage II (mild); Nocturnal leg cramps;  Constipation; and Moderate episode of recurrent major depressive disorder (HCC) on their problem list. Health Maintenance:   Immunization History  Administered Date(s) Administered  . Influenza Split 05/09/2014  . Influenza-Unspecified 05/02/2013  . Pneumococcal-Unspecified 08/15/1997  . Td 08/15/2006  . Tdap 09/15/2012   Tetanus: 2014 Flu vaccine: 2015 Shingrix: Postpone, unavailable in office  LMP: Patient has had an ablation. Pap: 2016 norm [id number] with diagnostic followup 03/30/2016 - L breast calcifications w neg biopsy - patient will follow up if she needs order for screening mammogram 2018 Colonoscopy: 2014 EGD: n/a  Last Dental Exam: 05/2017 will schedule follow up shortly Dr. Christie Nottingham  Last Eye Exam: 2017 Dr. Delight Stare Skin exam: 2015 no concerns  Patient Care Team: Unk Pinto, MD as PCP - General (Internal Medicine)  Surgical History:  She has a past surgical history that includes Gynecologic cryosurgery; chin surgery (Left); Novasure ablation; Oophorectomy (Right); Breast enhancement surgery; Cosmetic surgery; and Parotidectomy (Left, 09/2008). Family History:  Herfamily history includes Breast cancer in her maternal aunt and paternal aunt; Cancer in her father; Depression in her mother; Diabetes in her brother; Heart disease in her father, maternal grandfather, and paternal grandfather; Hypertension in her father; Stroke in her paternal grandfather; Tongue cancer in her father. Social History:  She reports that  has never smoked. she has never used smokeless tobacco. She reports that she drinks alcohol. She reports that she does not use drugs.  Review of Systems: Review of Systems  Constitutional: Positive for malaise/fatigue. Negative for chills, diaphoresis, fever and weight loss.  HENT: Negative for ear pain, hearing loss and tinnitus.   Eyes: Negative for blurred vision, double vision, photophobia and pain.  Respiratory: Negative for cough, sputum production,  shortness of breath and wheezing.   Cardiovascular: Positive for leg swelling (Intermittently with walking). Negative for chest pain, palpitations, orthopnea and claudication.  Gastrointestinal: Positive for constipation. Negative for abdominal pain, blood in stool, diarrhea, heartburn, melena, nausea and vomiting.  Genitourinary: Negative.  Negative for dysuria and urgency.  Musculoskeletal: Positive for neck pain. Negative for joint pain and myalgias.  Skin: Negative for rash.  Neurological: Negative for dizziness, tingling, sensory  change, weakness and headaches.  Endo/Heme/Allergies: Positive for environmental allergies. Negative for polydipsia.  Psychiatric/Behavioral: Positive for depression. Negative for memory loss, substance abuse and suicidal ideas. The patient has insomnia. The patient is not nervous/anxious.   All other systems reviewed and are negative.   Physical Exam: Estimated body mass index is 27.42 kg/m as calculated from the following:   Height as of this encounter: 5' 4.25" (1.632 m).   Weight as of this encounter: 161 lb (73 kg). BP 124/72   Pulse 85   Temp 97.8 F (36.6 C)   Ht 5' 4.25" (1.632 m)   Wt 161 lb (73 kg)   SpO2 99%   BMI 27.42 kg/m  General Appearance: Well nourished, in no apparent distress.  Eyes: PERRLA, EOMs, conjunctiva no swelling or erythema, normal fundi and vessels.  Sinuses: No Frontal/maxillary tenderness  ENT/Mouth: Ext aud canals clear, normal light reflex with TMs without erythema, bulging. Good dentition. No erythema, swelling, or exudate on post pharynx. Tonsils not swollen or erythematous. Hearing normal.  Neck: Supple, thyroid normal. No bruits  Respiratory: Respiratory effort normal, BS equal bilaterally without rales, rhonchi, wheezing or stridor.  Cardio: RRR without murmurs, rubs or gallops. Somewhat diminished distal pulses, 1+ bilaterally without edema.  Chest: symmetric, with normal excursions and percussion.  Breasts:  Patient requests to defer Abdomen: Soft, some tenderness to LLQ to deep palpation, no guarding, rebound, hernias, masses, or organomegaly.  Lymphatics: Non tender without lymphadenopathy.  Genitourinary: Defer Musculoskeletal: Full ROM all peripheral extremities,5/5 strength, and normal gait.  Skin: Warm, dry without rashes, lesions, ecchymosis. Neuro: Cranial nerves intact, reflexes diminished throughout though equal bilaterally. Normal muscle tone, no cerebellar symptoms. Sensation intact.  Psych: Awake and oriented X 3, normal affect, Insight and Judgment appropriate.   EKG: WNL no ST changes.   Gorden Harms Shalen Petrak 11:31 AM [facility]

## 2017-06-20 ENCOUNTER — Encounter: Payer: Self-pay | Admitting: Adult Health

## 2017-06-20 ENCOUNTER — Ambulatory Visit (INDEPENDENT_AMBULATORY_CARE_PROVIDER_SITE_OTHER): Payer: 59 | Admitting: Adult Health

## 2017-06-20 VITALS — BP 124/72 | HR 85 | Temp 97.8°F | Ht 64.25 in | Wt 161.0 lb

## 2017-06-20 DIAGNOSIS — N182 Chronic kidney disease, stage 2 (mild): Secondary | ICD-10-CM

## 2017-06-20 DIAGNOSIS — Z0000 Encounter for general adult medical examination without abnormal findings: Secondary | ICD-10-CM

## 2017-06-20 DIAGNOSIS — Z79899 Other long term (current) drug therapy: Secondary | ICD-10-CM

## 2017-06-20 DIAGNOSIS — Z1389 Encounter for screening for other disorder: Secondary | ICD-10-CM

## 2017-06-20 DIAGNOSIS — Z13220 Encounter for screening for lipoid disorders: Secondary | ICD-10-CM

## 2017-06-20 DIAGNOSIS — Z1211 Encounter for screening for malignant neoplasm of colon: Secondary | ICD-10-CM

## 2017-06-20 DIAGNOSIS — Z131 Encounter for screening for diabetes mellitus: Secondary | ICD-10-CM

## 2017-06-20 DIAGNOSIS — Z1329 Encounter for screening for other suspected endocrine disorder: Secondary | ICD-10-CM

## 2017-06-20 DIAGNOSIS — M545 Low back pain: Secondary | ICD-10-CM

## 2017-06-20 DIAGNOSIS — I10 Essential (primary) hypertension: Secondary | ICD-10-CM

## 2017-06-20 DIAGNOSIS — D649 Anemia, unspecified: Secondary | ICD-10-CM

## 2017-06-20 DIAGNOSIS — G4700 Insomnia, unspecified: Secondary | ICD-10-CM

## 2017-06-20 DIAGNOSIS — K5900 Constipation, unspecified: Secondary | ICD-10-CM | POA: Insufficient documentation

## 2017-06-20 DIAGNOSIS — F331 Major depressive disorder, recurrent, moderate: Secondary | ICD-10-CM

## 2017-06-20 DIAGNOSIS — Z136 Encounter for screening for cardiovascular disorders: Secondary | ICD-10-CM

## 2017-06-20 DIAGNOSIS — F988 Other specified behavioral and emotional disorders with onset usually occurring in childhood and adolescence: Secondary | ICD-10-CM

## 2017-06-20 DIAGNOSIS — Z0001 Encounter for general adult medical examination with abnormal findings: Secondary | ICD-10-CM

## 2017-06-20 DIAGNOSIS — G4762 Sleep related leg cramps: Secondary | ICD-10-CM

## 2017-06-20 DIAGNOSIS — G8929 Other chronic pain: Secondary | ICD-10-CM

## 2017-06-20 DIAGNOSIS — R5383 Other fatigue: Secondary | ICD-10-CM

## 2017-06-20 DIAGNOSIS — Z130 Encounter for screening for diseases of the blood and blood-forming organs and certain disorders involving the immune mechanism: Secondary | ICD-10-CM

## 2017-06-20 DIAGNOSIS — G43009 Migraine without aura, not intractable, without status migrainosus: Secondary | ICD-10-CM

## 2017-06-20 DIAGNOSIS — T7840XS Allergy, unspecified, sequela: Secondary | ICD-10-CM

## 2017-06-20 DIAGNOSIS — F3340 Major depressive disorder, recurrent, in remission, unspecified: Secondary | ICD-10-CM | POA: Insufficient documentation

## 2017-06-20 DIAGNOSIS — E559 Vitamin D deficiency, unspecified: Secondary | ICD-10-CM

## 2017-06-20 HISTORY — DX: Chronic kidney disease, stage 2 (mild): N18.2

## 2017-06-20 MED ORDER — ESCITALOPRAM OXALATE 20 MG PO TABS: 20.0000 mg | ORAL_TABLET | Freq: Every day | ORAL | 3 refills | Status: DC

## 2017-06-20 MED ORDER — BISACODYL 5 MG PO TBEC: 5.0000 mg | DELAYED_RELEASE_TABLET | Freq: Every day | ORAL | 1 refills | Status: DC | PRN

## 2017-06-20 NOTE — Patient Instructions (Addendum)
We are starting lexapro today; I have written for 20 mg tabs - you can start by taking 10 mg (1/2 tab) daily for 2 weeks, then increase to 20 mg daily. Some people experience some nausea when first starting this medication, but this typically resolves within the first few weeks. Let me know if you are having difficulties. I'd like to follow up in 8-12 weeks to see how things are going, and if you feel there isn't much of a difference we can try prozac, which is another good alternative. I'd encourage you to follow up with cognitive behavioral therapy as discussed, which helps develop lifelong stress management skills that will be helpful even when medication is stopped.   Consider increasing fluids to 100+ fluid ounces daily  Consider a personal trainer, and possibly a nutritionist to look at your "macro" spread and calories; sometimes with drastic diet changes we can actually not be getting enough carbs/fat for healthy weight loss.    Escitalopram tablets What is this medicine? ESCITALOPRAM (es sye TAL oh pram) is used to treat depression and certain types of anxiety. This medicine may be used for other purposes; ask your health care provider or pharmacist if you have questions. COMMON BRAND NAME(S): Lexapro What should I tell my health care provider before I take this medicine? They need to know if you have any of these conditions: -bipolar disorder or a family history of bipolar disorder -diabetes -glaucoma -heart disease -kidney or liver disease -receiving electroconvulsive therapy -seizures (convulsions) -suicidal thoughts, plans, or attempt by you or a family member -an unusual or allergic reaction to escitalopram, the related drug citalopram, other medicines, foods, dyes, or preservatives -pregnant or trying to become pregnant -breast-feeding How should I use this medicine? Take this medicine by mouth with a glass of water. Follow the directions on the prescription label. You can  take it with or without food. If it upsets your stomach, take it with food. Take your medicine at regular intervals. Do not take it more often than directed. Do not stop taking this medicine suddenly except upon the advice of your doctor. Stopping this medicine too quickly may cause serious side effects or your condition may worsen. A special MedGuide will be given to you by the pharmacist with each prescription and refill. Be sure to read this information carefully each time. Talk to your pediatrician regarding the use of this medicine in children. Special care may be needed. Overdosage: If you think you have taken too much of this medicine contact a poison control center or emergency room at once. NOTE: This medicine is only for you. Do not share this medicine with others. What if I miss a dose? If you miss a dose, take it as soon as you can. If it is almost time for your next dose, take only that dose. Do not take double or extra doses. What may interact with this medicine? Do not take this medicine with any of the following medications: -certain medicines for fungal infections like fluconazole, itraconazole, ketoconazole, posaconazole, voriconazole -cisapride -citalopram -dofetilide -dronedarone -linezolid -MAOIs like Carbex, Eldepryl, Marplan, Nardil, and Parnate -methylene blue (injected into a vein) -pimozide -thioridazine -ziprasidone This medicine may also interact with the following medications: -alcohol -amphetamines -aspirin and aspirin-like medicines -carbamazepine -certain medicines for depression, anxiety, or psychotic disturbances -certain medicines for migraine headache like almotriptan, eletriptan, frovatriptan, naratriptan, rizatriptan, sumatriptan, zolmitriptan -certain medicines for sleep -certain medicines that treat or prevent blood clots like warfarin, enoxaparin,  dalteparin -cimetidine -diuretics -fentanyl -  furazolidone -isoniazid -lithium -metoprolol -NSAIDs, medicines for pain and inflammation, like ibuprofen or naproxen -other medicines that prolong the QT interval (cause an abnormal heart rhythm) -procarbazine -rasagiline -supplements like St. John's wort, kava kava, valerian -tramadol -tryptophan This list may not describe all possible interactions. Give your health care provider a list of all the medicines, herbs, non-prescription drugs, or dietary supplements you use. Also tell them if you smoke, drink alcohol, or use illegal drugs. Some items may interact with your medicine. What should I watch for while using this medicine? Tell your doctor if your symptoms do not get better or if they get worse. Visit your doctor or health care professional for regular checks on your progress. Because it may take several weeks to see the full effects of this medicine, it is important to continue your treatment as prescribed by your doctor. Patients and their families should watch out for new or worsening thoughts of suicide or depression. Also watch out for sudden changes in feelings such as feeling anxious, agitated, panicky, irritable, hostile, aggressive, impulsive, severely restless, overly excited and hyperactive, or not being able to sleep. If this happens, especially at the beginning of treatment or after a change in dose, call your health care professional. Dennis Bast may get drowsy or dizzy. Do not drive, use machinery, or do anything that needs mental alertness until you know how this medicine affects you. Do not stand or sit up quickly, especially if you are an older patient. This reduces the risk of dizzy or fainting spells. Alcohol may interfere with the effect of this medicine. Avoid alcoholic drinks. Your mouth may get dry. Chewing sugarless gum or sucking hard candy, and drinking plenty of water may help. Contact your doctor if the problem does not go  away or is severe. What side effects may I notice from receiving this medicine? Side effects that you should report to your doctor or health care professional as soon as possible: -allergic reactions like skin rash, itching or hives, swelling of the face, lips, or tongue -anxious -black, tarry stools -changes in vision -confusion -elevated mood, decreased need for sleep, racing thoughts, impulsive behavior -eye pain -fast, irregular heartbeat -feeling faint or lightheaded, falls -feeling agitated, angry, or irritable -hallucination, loss of contact with reality -loss of balance or coordination -loss of memory -painful or prolonged erections -restlessness, pacing, inability to keep still -seizures -stiff muscles -suicidal thoughts or other mood changes -trouble sleeping -unusual bleeding or bruising -unusually weak or tired -vomiting Side effects that usually do not require medical attention (report to your doctor or health care professional if they continue or are bothersome): -changes in appetite -change in sex drive or performance -headache -increased sweating -indigestion, nausea -tremors This list may not describe all possible side effects. Call your doctor for medical advice about side effects. You may report side effects to FDA at 1-800-FDA-1088. Where should I keep my medicine? Keep out of reach of children. Store at room temperature between 15 and 30 degrees C (59 and 86 degrees F). Throw away any unused medicine after the expiration date. NOTE: This sheet is a summary. It may not cover all possible information. If you have questions about this medicine, talk to your doctor, pharmacist, or health care provider.  2018 Elsevier/Gold Standard (2016-01-04 13:20:23)

## 2017-06-21 LAB — LIPID PANEL
CHOL/HDL RATIO: 3 (calc) (ref <5.0)
Cholesterol: 206 mg/dL — ABNORMAL HIGH (ref <200)
HDL: 69 mg/dL (ref >50)
LDL Cholesterol (Calc): 117 mg/dL (calc) — ABNORMAL HIGH
NON-HDL CHOLESTEROL (CALC): 137 mg/dL — AB (ref <130)
Triglycerides: 92 mg/dL (ref <150)

## 2017-06-21 LAB — CBC WITH DIFFERENTIAL/PLATELET
Basophils Absolute: 43 cells/uL (ref 0–200)
Basophils Relative: 0.6 %
EOS PCT: 0.3 %
Eosinophils Absolute: 21 cells/uL (ref 15–500)
HCT: 39.3 % (ref 35.0–45.0)
HEMOGLOBIN: 13.4 g/dL (ref 11.7–15.5)
LYMPHS ABS: 3025 {cells}/uL (ref 850–3900)
MCH: 29.8 pg (ref 27.0–33.0)
MCHC: 34.1 g/dL (ref 32.0–36.0)
MCV: 87.5 fL (ref 80.0–100.0)
MONOS PCT: 7.3 %
MPV: 9.2 fL (ref 7.5–12.5)
Neutro Abs: 3493 cells/uL (ref 1500–7800)
Neutrophils Relative %: 49.2 %
Platelets: 420 10*3/uL — ABNORMAL HIGH (ref 140–400)
RBC: 4.49 10*6/uL (ref 3.80–5.10)
RDW: 11.9 % (ref 11.0–15.0)
Total Lymphocyte: 42.6 %
WBC mixed population: 518 cells/uL (ref 200–950)
WBC: 7.1 10*3/uL (ref 3.8–10.8)

## 2017-06-21 LAB — URINALYSIS, COMPLETE
BILIRUBIN URINE: NEGATIVE
Bacteria, UA: NONE SEEN /HPF
Glucose, UA: NEGATIVE
HGB URINE DIPSTICK: NEGATIVE
Hyaline Cast: NONE SEEN /LPF
KETONES UR: NEGATIVE
LEUKOCYTES UA: NEGATIVE
NITRITE: NEGATIVE
PROTEIN: NEGATIVE
RBC / HPF: NONE SEEN /HPF (ref 0–2)
SQUAMOUS EPITHELIAL / LPF: NONE SEEN /HPF (ref <=5)
Specific Gravity, Urine: 1.002 (ref 1.001–1.03)
WBC, UA: NONE SEEN /HPF (ref 0–5)
pH: 8 (ref 5.0–8.0)

## 2017-06-21 LAB — IRON, TOTAL/TOTAL IRON BINDING CAP
%SAT: 32 % (calc) (ref 11–50)
Iron: 113 ug/dL (ref 45–160)
TIBC: 348 ug/dL (ref 250–450)

## 2017-06-21 LAB — BASIC METABOLIC PANEL WITH GFR
BUN: 13 mg/dL (ref 7–25)
CO2: 32 mmol/L (ref 20–32)
CREATININE: 0.87 mg/dL (ref 0.50–1.05)
Calcium: 10 mg/dL (ref 8.6–10.4)
Chloride: 102 mmol/L (ref 98–110)
GFR, EST NON AFRICAN AMERICAN: 77 mL/min/{1.73_m2} (ref >=60)
GFR, Est African American: 89 mL/min/{1.73_m2} (ref >=60)
GLUCOSE: 94 mg/dL (ref 65–99)
POTASSIUM: 4.1 mmol/L (ref 3.5–5.3)
SODIUM: 140 mmol/L (ref 135–146)

## 2017-06-21 LAB — HEPATIC FUNCTION PANEL
AG RATIO: 1.9 (calc) (ref 1.0–2.5)
ALKALINE PHOSPHATASE (APISO): 79 U/L (ref 33–130)
ALT: 13 U/L (ref 6–29)
AST: 12 U/L (ref 10–35)
Albumin: 4.6 g/dL (ref 3.6–5.1)
BILIRUBIN INDIRECT: 0.4 mg/dL (ref 0.2–1.2)
BILIRUBIN TOTAL: 0.5 mg/dL (ref 0.2–1.2)
Bilirubin, Direct: 0.1 mg/dL (ref 0.0–0.2)
Globulin: 2.4 g/dL (calc) (ref 1.9–3.7)
TOTAL PROTEIN: 7 g/dL (ref 6.1–8.1)

## 2017-06-21 LAB — HEMOGLOBIN A1C
HEMOGLOBIN A1C: 5 %{Hb} (ref <5.7)
MEAN PLASMA GLUCOSE: 97 (calc)
eAG (mmol/L): 5.4 (calc)

## 2017-06-21 LAB — TSH: TSH: 2.41 mIU/L

## 2017-06-21 LAB — MAGNESIUM: Magnesium: 2.3 mg/dL (ref 1.5–2.5)

## 2017-06-21 LAB — VITAMIN D 25 HYDROXY (VIT D DEFICIENCY, FRACTURES): Vit D, 25-Hydroxy: 61 ng/mL (ref 30–100)

## 2017-06-21 LAB — SEDIMENTATION RATE: SED RATE: 2 mm/h (ref 0–30)

## 2017-06-21 LAB — VITAMIN B12: Vitamin B-12: 706 pg/mL (ref 200–1100)

## 2017-06-21 LAB — INSULIN, RANDOM: INSULIN: 8.9 u[IU]/mL (ref 2.0–19.6)

## 2017-06-21 LAB — C-REACTIVE PROTEIN: CRP: 5 mg/L (ref <8.0)

## 2017-06-29 NOTE — Addendum Note (Signed)
Addended by: Izora Ribas on: 06/29/2017 02:01 PM   Modules accepted: Orders

## 2017-06-30 ENCOUNTER — Other Ambulatory Visit: Payer: Self-pay

## 2017-06-30 DIAGNOSIS — G4762 Sleep related leg cramps: Secondary | ICD-10-CM

## 2017-07-05 ENCOUNTER — Encounter: Payer: Self-pay | Admitting: Physician Assistant

## 2017-07-05 ENCOUNTER — Ambulatory Visit (INDEPENDENT_AMBULATORY_CARE_PROVIDER_SITE_OTHER): Payer: 59 | Admitting: Physician Assistant

## 2017-07-05 VITALS — BP 110/74 | HR 90 | Temp 97.0°F

## 2017-07-05 DIAGNOSIS — M542 Cervicalgia: Secondary | ICD-10-CM | POA: Diagnosis not present

## 2017-07-05 MED ORDER — DEXAMETHASONE SODIUM PHOSPHATE 100 MG/10ML IJ SOLN: 10.0000 mg | Freq: Once | INTRAMUSCULAR | Status: DC

## 2017-07-05 NOTE — Progress Notes (Signed)
Subjective:    Patient ID: Carla Fuller, female    DOB: March 05, 1966, 51 y.o.   MRN: [id number]  HPI 51 y.o. WF presents with acute right trap.neck pain. She is following with ortho and had a facet cervical joint injection 1 week ago, has helped some with radidicular symptoms but still complains of pin point tenderness. No fever, chills, no radicular symptoms. Some tenderness at lateral epicondyle.   Blood pressure 110/74, pulse 90, temperature (!) 97 F (36.1 C), SpO2 99 %.  Medications Current Outpatient Medications on File Prior to Visit  Medication Sig  . amphetamine-dextroamphetamine (ADDERALL) 30 MG tablet Take 1 tablet by mouth daily.  Marland Kitchen azelastine (ASTELIN) 0.1 % nasal spray Place 2 sprays into both nostrils 2 (two) times daily. Use in each nostril as directed (Patient taking differently: Place 2 sprays as needed into both nostrils. Use in each nostril as directed)  . bisacodyl (DULCOLAX) 5 MG EC tablet Take 1 tablet (5 mg total) daily as needed by mouth for moderate constipation.  . Calcium-Magnesium 500-250 MG TABS Take 2 (two) times daily by mouth.  . doxepin (SINEQUAN) 10 MG capsule Take 1 capsule (10 mg total) by mouth at bedtime as needed.  Marland Kitchen escitalopram (LEXAPRO) 20 MG tablet Take 1 tablet (20 mg total) daily by mouth.  . estradiol (ESTRACE) 2 MG tablet Take 2 mg daily by mouth.  . fluconazole (DIFLUCAN) 150 MG tablet Take 1 tablet (150 mg total) by mouth daily.  . montelukast (SINGULAIR) 10 MG tablet take 1 tablet by mouth once daily  . progesterone (PROMETRIUM) 100 MG capsule Take 1 capsule daily for 10 days a month. (Patient taking differently: Take 100 mg daily by mouth. Take 1 capsule daily for 10 days a month.)  . rOPINIRole (REQUIP) 1 MG tablet take 1 to 2 tablets by mouth at bedtime for legs  . cyclobenzaprine (FLEXERIL) 5 MG tablet take 1 tablet by mouth at bedtime then take 1 tablet 1 hour RPIOR TO THERAPY (Patient not taking: Reported on 07/05/2017)   No  current facility-administered medications on file prior to visit.     Problem list She has Anemia; Insomnia; Vitamin D deficiency; ADD (attention deficit disorder); Migraines; Allergy; Chronic lumbar pain; Chronic kidney disease (CKD), stage II (mild); Nocturnal leg cramps; Constipation; and Moderate episode of recurrent major depressive disorder (HCC) on their problem list.    Review of Systems  Constitutional: Negative.   HENT: Negative.   Respiratory: Negative.   Cardiovascular: Negative.   Gastrointestinal: Negative.   Genitourinary: Negative for decreased urine volume, dysuria, enuresis, flank pain, frequency, genital sores, menstrual problem, pelvic pain, urgency, vaginal bleeding, vaginal discharge and vaginal pain.  Musculoskeletal: Positive for neck pain. Negative for arthralgias, back pain, gait problem, joint swelling, myalgias and neck stiffness.  Skin: Negative.  Negative for rash.  Neurological: Negative.   Psychiatric/Behavioral: Negative.        Objective:   Physical Exam  Musculoskeletal:  normal range of motion and supple, without spinous process tenderness, with paraspinal muscle tenderness the right side, normal sensation, reflexes, and pulses distal.       Assessment & Plan:   Acute neck pain -     dexamethasone (DECADRON) injection 10 mg  Verbal consent obtained, risk and benefits were addressed with patient. A trigger point injection was performed at the site of maximal tenderness using 1% plain Lidocaine and Dexamethasone. This was well tolerated, and followed by immediate relief of pain. Return precautions discussed with the patient.

## 2017-07-10 ENCOUNTER — Other Ambulatory Visit (INDEPENDENT_AMBULATORY_CARE_PROVIDER_SITE_OTHER): Payer: Self-pay

## 2017-07-10 ENCOUNTER — Encounter: Payer: Self-pay | Admitting: Physician Assistant

## 2017-07-10 ENCOUNTER — Other Ambulatory Visit: Payer: Self-pay | Admitting: Physician Assistant

## 2017-07-11 MED ORDER — LIDOCAINE 5 % EX PTCH: MEDICATED_PATCH | CUTANEOUS | 0 refills | Status: DC

## 2017-07-11 MED ORDER — AMPHETAMINE-DEXTROAMPHETAMINE 30 MG PO TABS: 30.0000 mg | ORAL_TABLET | Freq: Every day | ORAL | 0 refills | Status: DC

## 2017-07-11 MED ORDER — CYCLOBENZAPRINE HCL 5 MG PO TABS: ORAL_TABLET | ORAL | 0 refills | Status: DC

## 2017-07-11 MED ORDER — ALPRAZOLAM 0.5 MG PO TABS: 0.5000 mg | ORAL_TABLET | Freq: Three times a day (TID) | ORAL | 0 refills | Status: DC | PRN

## 2017-07-17 ENCOUNTER — Other Ambulatory Visit: Payer: Self-pay | Admitting: Internal Medicine

## 2017-07-17 DIAGNOSIS — T7840XS Allergy, unspecified, sequela: Secondary | ICD-10-CM

## 2017-07-19 ENCOUNTER — Ambulatory Visit (INDEPENDENT_AMBULATORY_CARE_PROVIDER_SITE_OTHER): Payer: 59 | Admitting: Physician Assistant

## 2017-07-19 DIAGNOSIS — M542 Cervicalgia: Secondary | ICD-10-CM

## 2017-07-19 MED ORDER — DEXAMETHASONE SODIUM PHOSPHATE 100 MG/10ML IJ SOLN
10.0000 mg | Freq: Once | INTRAMUSCULAR | Status: AC
  Administered 2017-07-19: 10 mg via INTRAMUSCULAR

## 2017-07-19 NOTE — Progress Notes (Signed)
Subjective:    Patient ID: Carla Fuller, female    DOB: [birth year], 51 y.o.   MRN: [id number]  HPI 51 y.o. WF presents with acute right trap.neck pain, last injection helped some but has different area more superior that she would like an injection before her ski trip. No fever, chills, no radicular symptoms.   Medications Current Outpatient Medications on File Prior to Visit  Medication Sig  . ALPRAZolam (XANAX) 0.5 MG tablet Take 1 tablet (0.5 mg total) by mouth 3 (three) times daily as needed for sleep or anxiety.  Marland Kitchen amphetamine-dextroamphetamine (ADDERALL) 30 MG tablet Take 1 tablet by mouth daily.  Marland Kitchen azelastine (ASTELIN) 0.1 % nasal spray Place 2 sprays into both nostrils 2 (two) times daily. Use in each nostril as directed (Patient taking differently: Place 2 sprays as needed into both nostrils. Use in each nostril as directed)  . bisacodyl (DULCOLAX) 5 MG EC tablet Take 1 tablet (5 mg total) daily as needed by mouth for moderate constipation.  . Calcium-Magnesium 500-250 MG TABS Take 2 (two) times daily by mouth.  . cyclobenzaprine (FLEXERIL) 5 MG tablet take 1 tablet by mouth at bedtime then take 1 tablet 1 hour RPIOR TO THERAPY  . doxepin (SINEQUAN) 10 MG capsule Take 1 capsule (10 mg total) by mouth at bedtime as needed.  Marland Kitchen escitalopram (LEXAPRO) 20 MG tablet Take 1 tablet (20 mg total) daily by mouth.  . estradiol (ESTRACE) 2 MG tablet Take 2 mg daily by mouth.  . fluconazole (DIFLUCAN) 150 MG tablet Take 1 tablet (150 mg total) by mouth daily.  Marland Kitchen lidocaine (LIDODERM) 5 % Apply 1 patch every 12 hours. Remove & Discard patch within 12 hours or as directed by MD  . montelukast (SINGULAIR) 10 MG tablet take 1 tablet by mouth once daily  . predniSONE (DELTASONE) 20 MG tablet TAKE 1/2 TO 1 TABLET BY MOUTH 2 TO 3 TIMES DAILY AS DIRECTED FOR ALLERGIC REACTION  . progesterone (PROMETRIUM) 100 MG capsule Take 1 capsule daily for 10 days a month. (Patient taking differently: Take 100 mg  daily by mouth. Take 1 capsule daily for 10 days a month.)  . rOPINIRole (REQUIP) 1 MG tablet take 1 to 2 tablets by mouth at bedtime for legs   Current Facility-Administered Medications on File Prior to Visit  Medication  . dexamethasone (DECADRON) injection 10 mg    Problem list She has Anemia; Insomnia; Vitamin D deficiency; ADD (attention deficit disorder); Migraines; Allergy; Chronic lumbar pain; Chronic kidney disease (CKD), stage II (mild); Nocturnal leg cramps; Constipation; and Moderate episode of recurrent major depressive disorder (HCC) on their problem list.    Review of Systems  Constitutional: Negative.   HENT: Negative.   Respiratory: Negative.   Cardiovascular: Negative.   Gastrointestinal: Negative.   Genitourinary: Negative for decreased urine volume, dysuria, enuresis, flank pain, frequency, genital sores, menstrual problem, pelvic pain, urgency, vaginal bleeding, vaginal discharge and vaginal pain.  Musculoskeletal: Positive for neck pain. Negative for arthralgias, back pain, gait problem, joint swelling, myalgias and neck stiffness.  Skin: Negative.  Negative for rash.  Neurological: Negative.   Psychiatric/Behavioral: Negative.        Objective:   Physical Exam  Musculoskeletal:  normal range of motion and supple, without spinous process tenderness, with paraspinal muscle tenderness the right side, normal sensation, reflexes, and pulses distal.       Assessment & Plan:   Acute neck pain -     dexamethasone (DECADRON) injection 10 mg  Verbal consent obtained, risk and benefits were addressed with patient. A trigger point injection was performed at the site of maximal tenderness using 1% plain Lidocaine and Dexamethasone. This was well tolerated, and followed by immediate relief of pain. Return precautions discussed with the patient.

## 2017-07-20 ENCOUNTER — Other Ambulatory Visit: Payer: Self-pay | Admitting: Internal Medicine

## 2017-07-20 DIAGNOSIS — Z1231 Encounter for screening mammogram for malignant neoplasm of breast: Secondary | ICD-10-CM

## 2017-07-20 MED ORDER — PROMETHAZINE-DM 6.25-15 MG/5ML PO SYRP: ORAL_SOLUTION | ORAL | 1 refills | Status: DC

## 2017-08-03 ENCOUNTER — Encounter (HOSPITAL_COMMUNITY): Payer: 59

## 2017-08-03 ENCOUNTER — Encounter: Payer: 59 | Admitting: Vascular Surgery

## 2017-08-14 ENCOUNTER — Ambulatory Visit: Payer: Self-pay | Admitting: Physician Assistant

## 2017-08-14 ENCOUNTER — Encounter: Payer: Self-pay | Admitting: Physician Assistant

## 2017-08-14 MED ORDER — ROPINIROLE HCL 1 MG PO TABS: ORAL_TABLET | ORAL | 3 refills | Status: DC

## 2017-08-15 ENCOUNTER — Other Ambulatory Visit: Payer: Self-pay | Admitting: Internal Medicine

## 2017-08-15 ENCOUNTER — Other Ambulatory Visit: Payer: Self-pay | Admitting: Adult Health

## 2017-08-15 DIAGNOSIS — K5900 Constipation, unspecified: Secondary | ICD-10-CM

## 2017-08-22 ENCOUNTER — Encounter: Payer: Self-pay | Admitting: Vascular Surgery

## 2017-08-22 ENCOUNTER — Ambulatory Visit (HOSPITAL_COMMUNITY)
Admission: RE | Admit: 2017-08-22 | Discharge: 2017-08-22 | Disposition: A | Discharge status: Home / Self Care | Payer: 59 | Source: Ambulatory Visit | Attending: Vascular Surgery | Admitting: Vascular Surgery

## 2017-08-22 ENCOUNTER — Ambulatory Visit: Payer: 59 | Admitting: Vascular Surgery

## 2017-08-22 VITALS — BP 109/69 | HR 91 | Temp 98.1°F | Resp 16 | Ht 64.0 in | Wt 163.0 lb

## 2017-08-22 DIAGNOSIS — R252 Cramp and spasm: Secondary | ICD-10-CM | POA: Diagnosis not present

## 2017-08-22 DIAGNOSIS — G4762 Sleep related leg cramps: Secondary | ICD-10-CM | POA: Diagnosis not present

## 2017-08-22 NOTE — Progress Notes (Signed)
HISTORY AND PHYSICAL     CC:  Pain, severe cramping in bilateral feet/toes Requesting Provider:  Unk Pinto, MD  HPI: This is a 52 y.o. female who states that she has cramps in her toes mainly at night but sometimes in the day that has been going on for quite some time.  She states that she also gets cramps in both her calves but the left is worse.  She states that when she gets these cramps, she can apply pressure to a couple of places on top of the foot or the arch or on the calf to help relieve some of the cramping.  She states that when she has walked for a while, she does get some swelling of her hands and her feet.  She states that her feet stay cold and she has a feeling of pins and needles in her feet at times.  She has been taking prescription medication for about 2 years and has gotten some relief with Requip.  Prior to that, she was trying non prescription therapies: ie:  supplements, mustard, etc.  She does get some pain behind her knee when sitting and puts her feet up under her chair.  She specifically denies cramping in her calves when walking certain distances.  She does not have any non healing wounds.    She has never smoked.  She states that she has borderline anemia,    Past Medical History:  Diagnosis Date  . Abnormal cells of cervix    PAP  . ADD (attention deficit disorder)   . Allergy   . Anemia   . Anxiety   . Chronic lumbar pain    Sacrum  . Insomnia   . Migraines   . Unspecified vitamin D deficiency     Past Surgical History:  Procedure Laterality Date  . BREAST ENHANCEMENT SURGERY    . chin surgery Left    knot removed  . COSMETIC SURGERY     breast saline implants  . GYNECOLOGIC CRYOSURGERY    . NOVASURE ABLATION    . OOPHORECTOMY Right   . PAROTIDECTOMY Left 09/2008   benign    Allergies  Allergen Reactions  . Percocet [Oxycodone-Acetaminophen]     Current Outpatient Medications  Medication Sig Dispense Refill  .  amphetamine-dextroamphetamine (ADDERALL) 30 MG tablet Take 1 tablet by mouth daily. 30 tablet 0  . azelastine (ASTELIN) 0.1 % nasal spray Place 2 sprays into both nostrils 2 (two) times daily. Use in each nostril as directed (Patient taking differently: Place 2 sprays as needed into both nostrils. Use in each nostril as directed) 30 mL 2  . Calcium-Magnesium 500-250 MG TABS Take 2 (two) times daily by mouth.    . Cholecalciferol (VITAMIN D PO) Take 5,000 Units by mouth.    . doxepin (SINEQUAN) 10 MG capsule Take 1 capsule (10 mg total) by mouth at bedtime as needed. 30 capsule 5  . escitalopram (LEXAPRO) 20 MG tablet Take 1 tablet (20 mg total) daily by mouth. 30 tablet 3  . estradiol (ESTRACE) 2 MG tablet Take 2 mg daily by mouth.    . montelukast (SINGULAIR) 10 MG tablet take 1 tablet by mouth once daily 90 tablet 1  . progesterone (PROMETRIUM) 100 MG capsule Take 1 capsule daily for 10 days a month. (Patient taking differently: Take 100 mg daily by mouth. Take 1 capsule daily for 10 days a month.) 30 capsule 2  . RA LAXATIVE 5 MG EC tablet take 1 tablet by  mouth daily if needed for MODERATE CONSTIPATION 30 tablet 1  . rOPINIRole (REQUIP) 1 MG tablet take 1 to 2 tablets by mouth at bedtime for legs 180 tablet 3  . ALPRAZolam (XANAX) 0.5 MG tablet Take 1 tablet (0.5 mg total) by mouth 3 (three) times daily as needed for sleep or anxiety. (Patient not taking: Reported on 08/22/2017) 10 tablet 0  . cyclobenzaprine (FLEXERIL) 5 MG tablet take 1 tablet by mouth at bedtime then take 1 tablet 1 hour PRIOR TO THERAPY (Patient not taking: Reported on 08/22/2017) 30 tablet 0  . lidocaine (LIDODERM) 5 % Apply 1 patch every 12 hours. Remove & Discard patch within 12 hours or as directed by MD (Patient not taking: Reported on 08/22/2017) 6 patch 0  . promethazine-dextromethorphan (PROMETHAZINE-DM) 6.25-15 MG/5ML syrup Take 1 to 2 tsp enery 4 hours if needed for cough (Patient not taking: Reported on 08/22/2017) 360 mL 1     No current facility-administered medications for this visit.     Family History  Problem Relation Age of Onset  . Diabetes Brother   . Heart disease Father   . Tongue cancer Father        squamous cell carcinoma mets  . Hypertension Father   . Cancer Father        tongue- squamous cell with tobacco hx  . Depression Mother   . Breast cancer Paternal Aunt   . Breast cancer Maternal Aunt   . Heart disease Paternal Grandfather   . Stroke Paternal Grandfather   . Heart disease Maternal Grandfather     Social History   Socioeconomic History  . Marital status: Married    Spouse name: Not on file  . Number of children: 0  . Years of education: Not on file  . Highest education level: Not on file  Social Needs  . Financial resource strain: Not on file  . Food insecurity - worry: Not on file  . Food insecurity - inability: Not on file  . Transportation needs - medical: Not on file  . Transportation needs - non-medical: Not on file  Occupational History  . Occupation: Ship broker: [employer]  Tobacco Use  . Smoking status: Never Smoker  . Smokeless tobacco: Never Used  Substance and Sexual Activity  . Alcohol use: Yes    Comment: rarely-wine  . Drug use: No  . Sexual activity: Yes    Birth control/protection: Surgical  Other Topics Concern  . Not on file  Social History Narrative  . Not on file     REVIEW OF SYSTEMS:   [X]  denotes positive finding, [ ]  denotes negative finding Cardiac  Comments:  Chest pain or chest pressure:    Shortness of breath upon exertion:    Short of breath when lying flat:    Irregular heart rhythm:        Vascular    Pain in calf, thigh, or hip brought on by ambulation: x   Pain in feet at night that wakes you up from your sleep:  x   Blood clot in your veins:    Leg swelling:  x       Pulmonary    Oxygen at home:    Productive cough:     Wheezing:         Neurologic    Sudden weakness in  arms or legs:     Sudden numbness in arms or legs:     Sudden onset of  difficulty speaking or slurred speech:    Temporary loss of vision in one eye:     Problems with dizziness:         Gastrointestinal    Blood in stool:     Vomited blood:         Genitourinary    Burning when urinating:     Blood in urine:        Psychiatric    Major depression:         Hematologic    Bleeding problems:    Problems with blood clotting too easily:        Skin    Rashes or ulcers:        Constitutional    Fever or chills:      PHYSICAL EXAMINATION:  Vitals:   08/22/17 1413  BP: 109/69  Pulse: 91  Resp: 16  Temp: 98.1 F (36.7 C)  SpO2: 100%   Vitals:   08/22/17 1413  Weight: 163 lb (73.9 kg)  Height: 5\' 4"  (1.626 m)   Body mass index is 27.98 kg/m.  General:  WDWN in NAD; vital signs documented above Gait: Not observed HENT: WNL, normocephalic Pulmonary: normal non-labored breathing , without Rales, rhonchi,  wheezing Cardiac: regular HR, without  Murmurs without carotid bruits Abdomen: soft, NT, no masses Skin: without rashes Vascular Exam/Pulses:  Right Left  Radial 2+ (normal) 2+ (normal)  DP 2+ (normal) 2+ (normal)  PT Unable to palpate  Unable to palpate    Extremities: without ischemic changes, without Gangrene , without cellulitis; without open wounds;  Musculoskeletal: no muscle wasting or atrophy  Neurologic: A&O X 3;  No focal weakness or paresthesias are detected Psychiatric:  The pt has Normal affect.   Non-Invasive Vascular Imaging:   ABI's 08/22/17: Right:  1.12-- PT (B)  DP (B) Left:  1.15-- PT (B)  DP (T)  TBI's 08/22/17: Right:  0.85 Left:  0.88  Pt meds includes: Statin:  No. Beta Blocker:  No. Aspirin:  No. ACEI:  No. ARB:  No. CCB use:  No Other Antiplatelet/Anticoagulant:  No   ASSESSMENT/PLAN:: 52 y.o. female with bilateral leg cramping   -pt ABI's today are normal with a normal pulse exam.  She does not have arterial or  venous insufficiency.  She did inquire about compression therapy--she can try this, but given her sx, she most likely will not get any relief from this.  Continue Requip per PCP.   -she will follow up as needed.     Leontine Locket, PA-C [facility] [phone]  Clinic MD:  Pt seen and examined with Dr. Donnetta Hutching  I have examined the patient, reviewed and agree with above.  Curt Jews, MD 08/22/2017 4:55 PM

## 2017-08-25 ENCOUNTER — Ambulatory Visit
Admission: RE | Admit: 2017-08-25 | Discharge: 2017-08-25 | Disposition: A | Discharge status: Home / Self Care | Payer: 59 | Source: Ambulatory Visit | Attending: Internal Medicine | Admitting: Internal Medicine

## 2017-08-25 DIAGNOSIS — Z1231 Encounter for screening mammogram for malignant neoplasm of breast: Secondary | ICD-10-CM

## 2017-08-30 ENCOUNTER — Other Ambulatory Visit: Payer: Self-pay | Admitting: Physician Assistant

## 2017-08-30 MED ORDER — AMPHETAMINE-DEXTROAMPHETAMINE 30 MG PO TABS: 30.0000 mg | ORAL_TABLET | Freq: Every day | ORAL | 0 refills | Status: DC

## 2017-08-30 MED ORDER — LIDOCAINE 5 % EX PTCH: MEDICATED_PATCH | CUTANEOUS | 0 refills | Status: DC

## 2017-10-11 ENCOUNTER — Encounter: Payer: Self-pay | Admitting: Physician Assistant

## 2017-10-11 ENCOUNTER — Other Ambulatory Visit: Payer: Self-pay | Admitting: Physician Assistant

## 2017-10-11 MED ORDER — PREDNISONE 20 MG PO TABS: ORAL_TABLET | ORAL | 0 refills | Status: DC

## 2017-10-11 MED ORDER — CYCLOBENZAPRINE HCL 5 MG PO TABS: ORAL_TABLET | ORAL | 0 refills | Status: DC

## 2017-10-12 MED ORDER — LISDEXAMFETAMINE DIMESYLATE 50 MG PO CAPS: 50.0000 mg | ORAL_CAPSULE | Freq: Every day | ORAL | 0 refills | Status: DC

## 2017-10-16 ENCOUNTER — Other Ambulatory Visit: Payer: Self-pay | Admitting: Adult Health

## 2017-10-16 DIAGNOSIS — F331 Major depressive disorder, recurrent, moderate: Secondary | ICD-10-CM

## 2017-10-17 ENCOUNTER — Ambulatory Visit (INDEPENDENT_AMBULATORY_CARE_PROVIDER_SITE_OTHER): Payer: 59 | Admitting: Physician Assistant

## 2017-10-17 ENCOUNTER — Encounter: Payer: Self-pay | Admitting: Physician Assistant

## 2017-10-17 VITALS — BP 116/80 | HR 96 | Temp 97.7°F | Ht 64.0 in | Wt 165.0 lb

## 2017-10-17 DIAGNOSIS — Z1329 Encounter for screening for other suspected endocrine disorder: Secondary | ICD-10-CM

## 2017-10-17 DIAGNOSIS — D649 Anemia, unspecified: Secondary | ICD-10-CM | POA: Diagnosis not present

## 2017-10-17 DIAGNOSIS — Z79899 Other long term (current) drug therapy: Secondary | ICD-10-CM

## 2017-10-17 DIAGNOSIS — F988 Other specified behavioral and emotional disorders with onset usually occurring in childhood and adolescence: Secondary | ICD-10-CM | POA: Diagnosis not present

## 2017-10-17 DIAGNOSIS — R5383 Other fatigue: Secondary | ICD-10-CM

## 2017-10-17 DIAGNOSIS — R140 Abdominal distension (gaseous): Secondary | ICD-10-CM

## 2017-10-17 DIAGNOSIS — N182 Chronic kidney disease, stage 2 (mild): Secondary | ICD-10-CM | POA: Diagnosis not present

## 2017-10-17 DIAGNOSIS — R232 Flushing: Secondary | ICD-10-CM | POA: Diagnosis not present

## 2017-10-17 DIAGNOSIS — R109 Unspecified abdominal pain: Secondary | ICD-10-CM

## 2017-10-17 MED ORDER — ESTROGENS, CONJUGATED 0.625 MG/GM VA CREA: 1.0000 | TOPICAL_CREAM | Freq: Every day | VAGINAL | 12 refills | Status: DC

## 2017-10-17 MED ORDER — PRISTIQ 50 MG PO TB24: 50.0000 mg | ORAL_TABLET | Freq: Every day | ORAL | 3 refills | Status: DC

## 2017-10-17 MED ORDER — PHENTERMINE HCL 37.5 MG PO TABS: 37.5000 mg | ORAL_TABLET | Freq: Every day | ORAL | 2 refills | Status: DC

## 2017-10-17 NOTE — Progress Notes (Signed)
Subjective:    Patient ID: Carla Fuller, female    DOB: [birth year], 52 y.o.   MRN: [id number]  HPI 52 y.o. WF presents for follow up for neck pain, leg cramps, depression, hot flashes ADD and weight gain.  She was started on vyvanse, took first dose today to see if it would be more effective and have less physical symptoms with possible help to decrease weight.  She has depression/anxiety, especially centered around her weight. She has been on lexapro x 2-3 months but states that she does not see a big different with it. She has no motivation, does not want to be around people/grand kids which is unlike her.  She has decreased sex drive, she is on estrogen, progesterone daily, she has also been doing premarin/tesosterone cream.   She has been taking tyelnol for back, did do prednisone. Has been having epigastric pain and nausea at night. She has bloating, constipation, and lower quadrant pain.   Medications Current Outpatient Medications on File Prior to Visit  Medication Sig  . azelastine (ASTELIN) 0.1 % nasal spray Place 2 sprays into both nostrils 2 (two) times daily. Use in each nostril as directed (Patient taking differently: Place 2 sprays as needed into both nostrils. Use in each nostril as directed)  . Calcium-Magnesium 500-250 MG TABS Take by mouth daily.   . cetirizine (ZYRTEC) 10 MG tablet Take 10 mg by mouth daily.  . Cholecalciferol (VITAMIN D PO) Take 5,000 Units by mouth.  . cyclobenzaprine (FLEXERIL) 5 MG tablet take 1 tablet by mouth at bedtime then take 1 tablet 1 hour PRIOR TO THERAPY (Patient taking differently: as needed. take 1 tablet by mouth at bedtime then take 1 tablet 1 hour PRIOR TO THERAPY)  . doxepin (SINEQUAN) 10 MG capsule Take 1 capsule (10 mg total) by mouth at bedtime as needed.  Marland Kitchen escitalopram (LEXAPRO) 20 MG tablet take 1 tablet by mouth once daily  . estradiol (ESTRACE) 2 MG tablet Take 2 mg daily by mouth.  . lidocaine (LIDODERM) 5 % Apply 1 patch  every 12 hours. Remove & Discard patch within 12 hours or as directed by MD  . lisdexamfetamine (VYVANSE) 50 MG capsule Take 1 capsule (50 mg total) by mouth daily.  . montelukast (SINGULAIR) 10 MG tablet take 1 tablet by mouth once daily  . predniSONE (DELTASONE) 20 MG tablet 2 tablets daily for 3 days, 1 tablet daily for 4 days. (Patient taking differently: as needed. 2 tablets daily for 3 days, 1 tablet daily for 4 days.)  . progesterone (PROMETRIUM) 100 MG capsule Take 1 capsule daily for 10 days a month. (Patient taking differently: Take 100 mg daily by mouth. Take 1 capsule daily for 10 days a month.)  . RA LAXATIVE 5 MG EC tablet take 1 tablet by mouth daily if needed for MODERATE CONSTIPATION  . rOPINIRole (REQUIP) 1 MG tablet take 1 to 2 tablets by mouth at bedtime for legs  . ALPRAZolam (XANAX) 0.5 MG tablet Take 1 tablet (0.5 mg total) by mouth 3 (three) times daily as needed for sleep or anxiety. (Patient not taking: Reported on 08/22/2017)  . promethazine-dextromethorphan (PROMETHAZINE-DM) 6.25-15 MG/5ML syrup Take 1 to 2 tsp enery 4 hours if needed for cough (Patient not taking: Reported on 08/22/2017)   No current facility-administered medications on file prior to visit.     Problem list She has Anemia; Insomnia; Vitamin D deficiency; ADD (attention deficit disorder); Migraines; Allergy; Chronic lumbar pain; Chronic kidney disease (CKD),  stage II (mild); Nocturnal leg cramps; Constipation; and Moderate episode of recurrent major depressive disorder (HCC) on their problem list.   Review of Systems See HPI    Objective:   Physical Exam  Constitutional: She is oriented to person, place, and time. She appears well-developed and well-nourished.  Neck: Normal range of motion. Neck supple.  Cardiovascular: Normal rate and regular rhythm.  Pulmonary/Chest: Effort normal and breath sounds normal.  Abdominal: Soft. Bowel sounds are normal. She exhibits no mass. There is tenderness in the  epigastric area and suprapubic area. There is guarding. There is no rebound.  Neurological: She is alert and oriented to person, place, and time.  Skin: Skin is warm and dry. No rash noted.  Vitals reviewed.      Assessment & Plan:    Attention deficit disorder (ADD) without hyperactivity Continue vyvanse 50mg   Anemia, unspecified type -     CBC with Differential/Platelet -     Iron,Total/Total Iron Binding Cap -     Vitamin B12  Medication management -     CBC with Differential/Platelet -     BASIC METABOLIC PANEL WITH GFR -     Hepatic function panel  Screening for thyroid disorder -     TSH  Chronic kidney disease (CKD), stage II (mild) -     BASIC METABOLIC PANEL WITH GFR  Hot flashes -     Estrogens, Total -     conjugated estrogens (PREMARIN) vaginal cream; Place 1 Applicatorful vaginally daily. -     Testosterone, Total, LC/MS/MS  Fatigue, unspecified type Check labs, stop lexapro, add on pristiq, if this does not help try trintellix, has failed lexapro, zoloft, wellbutrin.  -     Iron,Total/Total Iron Binding Cap -     Vitamin B12 -     Cancel: Testosterone -     Estrogens, Total -     PRISTIQ 50 MG 24 hr tablet; Take 1 tablet (50 mg total) by mouth daily. -     Testosterone, Total, LC/MS/MS  Abdominal bloating/pain Add H2, stop calcium given info about diet -     US Abdomen Complete; Future -     US PELVIS TRANSVANGINAL NON-OB (TV ONLY); Future -     US PELVIS (TRANSABDOMINAL ONLY); Future  Overweight -     phentermine (ADIPEX-P) 37.5 MG tablet; Take 1 tablet (37.5 mg total) by mouth daily before breakfast. - long discussion about weight loss, diet, and exercise -recommended diet heavy in fruits and veggies and low in animal meats, cheeses, and dairy products

## 2017-10-17 NOTE — Patient Instructions (Addendum)
1/2 syringe premarin cream every night for 2 weeks THEN Premarin do 1/2 of syringe once every week or 2 Continue testosterone cream but do pea size somewhere else.   Stop lexapro- try pristiq  Continue vyvanse 50 mg for now, we may increase this to 60 mg.   Mini habits for weight loss- get the book  STOP calcium and start on magnesium on empty stomach, get mag citrate, and take [phone] mg at once  Take omeprazole over the counter for 2 weeks, then go to zantac 150-300 mg OR pepcid 20 or 40mg  at night for 2 weeks, then you can stop or continue as needed.  Avoid alcohol, spicy foods, NSAIDS (aleve, ibuprofen) at this time. See foods below.   Food Choices for Gastroesophageal Reflux Disease When you have gastroesophageal reflux disease (GERD), the foods you eat and your eating habits are very important. Choosing the right foods can help ease the discomfort of GERD. WHAT GENERAL GUIDELINES DO I NEED TO FOLLOW?  Choose fruits, vegetables, whole grains, low-fat dairy products, and low-fat meat, fish, and poultry.  Limit fats such as oils, salad dressings, butter, nuts, and avocado.  Keep a food diary to identify foods that cause symptoms.  Avoid foods that cause reflux. These may be different for different people.  Eat frequent small meals instead of three large meals each day.  Eat your meals slowly, in a relaxed setting.  Limit fried foods.  Cook foods using methods other than frying.  Avoid drinking alcohol.  Avoid drinking large amounts of liquids with your meals.  Avoid bending over or lying down until 2-3 hours after eating. WHAT FOODS ARE NOT RECOMMENDED? The following are some foods and drinks that may worsen your symptoms: Vegetables Tomatoes. Tomato juice. Tomato and spaghetti sauce. Chili peppers. Onion and garlic. Horseradish. Fruits Oranges, grapefruit, and lemon (fruit and juice). Meats High-fat meats, fish, and poultry. This includes hot dogs, ribs, ham,  sausage, salami, and bacon. Dairy Whole milk and chocolate milk. Sour cream. Cream. Butter. Ice cream. Cream cheese.  Beverages Coffee and tea, with or without caffeine. Carbonated beverages or energy drinks. Condiments Hot sauce. Barbecue sauce.  Sweets/Desserts Chocolate and cocoa. Donuts. Peppermint and spearmint. Fats and Oils High-fat foods, including [place] fries and potato chips. Other Vinegar. Strong spices, such as black pepper, white pepper, red pepper, cayenne, curry powder, cloves, ginger, and chili powder.  Are you an emotional eater? Do you eat more when you're feeling stressed? Do you eat when you're not hungry or when you're full? Do you eat to feel better (to calm and soothe yourself when you're sad, mad, bored, anxious, etc.)? Do you reward yourself with food? Do you regularly eat until you've stuffed yourself? Does food make you feel safe? Do you feel like food is a friend? Do you feel powerless or out of control around food?  If you answered yes to some of these questions than it is likely that you are an emotional eater. This is normally a learned behavior and can take time to first recognize the signs and second BREAK THE HABIT. But here is more information and tips to help.   The difference between emotional hunger and physical hunger Emotional hunger can be powerful, so it's easy to mistake it for physical hunger. But there are clues you can look for to help you tell physical and emotional hunger apart.  Emotional hunger comes on suddenly. It hits you in an instant and feels overwhelming and urgent. Physical hunger, on  the other hand, comes on more gradually. The urge to eat doesn't feel as dire or demand instant satisfaction (unless you haven't eaten for a very long time).  Emotional hunger craves specific comfort foods. When you're physically hungry, almost anything sounds good-including healthy stuff like vegetables. But emotional hunger craves junk food or  sugary snacks that provide an instant rush. You feel like you need cheesecake or pizza, and nothing else will do.  Emotional hunger often leads to mindless eating. Before you know it, you've eaten a whole bag of chips or an entire pint of ice cream without really paying attention or fully enjoying it. When you're eating in response to physical hunger, you're typically more aware of what you're doing.  Emotional hunger isn't satisfied once you're full. You keep wanting more and more, often eating until you're uncomfortably stuffed. Physical hunger, on the other hand, doesn't need to be stuffed. You feel satisfied when your stomach is full.  Emotional hunger isn't located in the stomach. Rather than a growling belly or a pang in your stomach, you feel your hunger as a craving you can't get out of your head. You're focused on specific textures, tastes, and smells.  Emotional hunger often leads to regret, guilt, or shame. When you eat to satisfy physical hunger, you're unlikely to feel guilty or ashamed because you're simply giving your body what it needs. If you feel guilty after you eat, it's likely because you know deep down that you're not eating for nutritional reasons.  Identify your emotional eating triggers What situations, places, or feelings make you reach for the comfort of food? Most emotional eating is linked to unpleasant feelings, but it can also be triggered by positive emotions, such as rewarding yourself for achieving a goal or celebrating a holiday or happy event. Common causes of emotional eating include:  Stuffing emotions - Eating can be a way to temporarily silence or "stuff down" uncomfortable emotions, including anger, fear, sadness, anxiety, loneliness, resentment, and shame. While you're numbing yourself with food, you can avoid the difficult emotions you'd rather not feel.  Boredom or feelings of emptiness - Do you ever eat simply to give yourself something to do, to relieve  boredom, or as a way to fill a void in your life? You feel unfulfilled and empty, and food is a way to occupy your mouth and your time. In the moment, it fills you up and distracts you from underlying feelings of purposelessness and dissatisfaction with your life.  Childhood habits - Think back to your childhood memories of food. Did your parents reward good behavior with ice cream, take you out for pizza when you got a good report card, or serve you sweets when you were feeling sad? These habits can often carry over into adulthood. Or your eating may be driven by nostalgia-for cherished memories of grilling burgers in the backyard with your dad or baking and eating cookies with your mom.  Social influences - Getting together with other people for a meal is a great way to relieve stress, but it can also lead to overeating. It's easy to overindulge simply because the food is there or because everyone else is eating. You may also overeat in social situations out of nervousness. Or perhaps your family or circle of friends encourages you to overeat, and it's easier to go along with the group.  Stress - Ever notice how stress makes you hungry? It's not just in your mind. When stress is chronic, as it  so often is in our chaotic, fast-paced world, your body produces high levels of the stress hormone, cortisol. Cortisol triggers cravings for salty, sweet, and fried foods-foods that give you a burst of energy and pleasure. The more uncontrolled stress in your life, the more likely you are to turn to food for emotional relief.  Find other ways to feed your feelings If you don't know how to manage your emotions in a way that doesn't involve food, you won't be able to control your eating habits for very long. Diets so often fail because they offer logical nutritional advice which only works if you have conscious control over your eating habits. It doesn't work when emotions hijack the process, demanding an immediate  payoff with food.  In order to stop emotional eating, you have to find other ways to fulfill yourself emotionally. It's not enough to understand the cycle of emotional eating or even to understand your triggers, although that's a huge first step. You need alternatives to food that you can turn to for emotional fulfillment.  Alternatives to emotional eating If you're depressed or lonely, call someone who always makes you feel better, play with your dog or cat, or look at a favorite photo or cherished memento.  If you're anxious, expend your nervous energy by dancing to your favorite song, squeezing a stress ball, or taking a brisk walk.  If you're exhausted, treat yourself with a hot cup of tea, take a bath, light some scented candles, or wrap yourself in a warm blanket.  If you're bored, read a good book, watch a comedy show, explore the outdoors, or turn to an activity you enjoy (woodworking, playing the guitar, shooting hoops, scrapbooking, etc.).  What is mindful eating? Mindful eating is a practice that develops your awareness of eating habits and allows you to pause between your triggers and your actions. Most emotional eaters feel powerless over their food cravings. When the urge to eat hits, you feel an almost unbearable tension that demands to be fed, right now. Because you've tried to resist in the past and failed, you believe that your willpower just isn't up to snuff. But the truth is that you have more power over your cravings than you think.  Take 5 before you give in to a craving Emotional eating tends to be automatic and virtually mindless. Before you even realize what you're doing, you've reached for a tub of ice cream and polished off half of it. But if you can take a moment to pause and reflect when you're hit with a craving, you give yourself the opportunity to make a different decision.  Can you put off eating for five minutes? Or just start with one minute. Don't tell yourself  you can't give in to the craving; remember, the forbidden is extremely tempting. Just tell yourself to wait.  While you're waiting, check in with yourself. How are you feeling? What's going on emotionally? Even if you end up eating, you'll have a better understanding of why you did it. This can help you set yourself up for a different response next time.  How to practice mindful eating Eating while you're also doing other things-such as watching TV, driving, or playing with your phone-can prevent you from fully enjoying your food. Since your mind is elsewhere, you may not feel satisfied or continue eating even though you're no longer hungry. Eating more mindfully can help focus your mind on your food and the pleasure of a meal and curb overeating.  Eat your meals in a calm place with no distractions, aside from any dining companions.  Try eating with your non-dominant hand or using chopsticks instead of a knife and fork. Eating in such a non-familiar way can slow down how fast you eat and ensure your mind stays focused on your food.  Allow yourself enough time not to have to rush your meal. Set a timer for 20 minutes and pace yourself so you spend at least that much time eating.  Take small bites and chew them well, taking time to notice the different flavors and textures of each mouthful.  Put your utensils down between bites. Take time to consider how you feel-hungry, satiated-before picking up your utensils again.  Try to stop eating before you are full.It takes time for the signal to reach your brain that you've had enough. Don't feel obligated to always clean your plate.  When you've finished your food, take a few moments to assess if you're really still hungry before opting for an extra serving or dessert.  Learn to accept your feelings-even the bad ones  While it may seem that the core problem is that you're powerless over food, emotional eating actually stems from feeling powerless  over your emotions. You don't feel capable of dealing with your feelings head on, so you avoid them with food.  Recommended reading  Mini Habits for weight loss  Healthy Eating: A guide to the new nutrition - [place] Report  10 Tips for Mindful Eating - How mindfulness can help you fully enjoy a meal and the experience of eating-with moderation and restraint. ([place])  Weight Loss: Gain Control of Emotional Eating - Tips to regain control of your eating habits. [facility])  Why Stress Causes People to Overeat -Tips on controlling stress eating. ([place])  Mindful Eating Meditations -Free online mindfulness meditations. ([facility])    Phentermine  While taking the medication we may ask that you come into the office once a month for the first month for a blood pressure check and EKG.   Also please bring in a food log for that visit to review. It is helpful if you bring in a food diary or use an app on your phone such as myfitnesspal to record your calorie intake, especially in the beginning. BRING FOR YOUR FIRST VISIT.  After that first initial visit, we will want to see you once every 2-3 months to monitor your weight, blood pressure, and heart rate.   In addition we can help answer your questions about diet, exercise, and help you every step of the way with your weight loss journey.  You can start out on 1/3 to 1/2 a pill in the morning and if you are tolerating it well you can increase to one pill daily. I also have some patients that take 1/3 or 1/2 at lunch to help prevent night time eating.  This medication is cheapest CASH pay at [facility] is 14-17 dollars and you do NOT need a membership to get meds from there.   It causes dry mouth and constipation in almost every patient, so try to get 80-100 oz of water a day and increase fiber such as veggies. You can add on a stool  softener if you would like.   It can give you energy however it can also cause some people to be shaky, anxious or have palpitations. Stop this medication if  that happens and contact the office.   If this medication does not work for you there are several medications that we can try to help rewire your brain in addition to making healthier habits.   What is this medicine? PHENTERMINE (FEN ter meen) decreases your appetite. This medicine is intended to be used in addition to a healthy reduced calorie diet and exercise. The best results are achieved this way. This medicine is only indicated for short-term use. Eventually your weight loss may level out and the medication will no longer be needed.   How should I use this medicine? Take this medicine by mouth. Follow the directions on the prescription label. The tablets should stay in the bottle until immediately before you take your dose. Take your doses at regular intervals. Do not take your medicine more often than directed.  Overdosage: If you think you have taken too much of this medicine contact a poison control center or emergency room at once. NOTE: This medicine is only for you. Do not share this medicine with others.  What if I miss a dose? If you miss a dose, take it as soon as you can. If it is almost time for your next dose, take only that dose. Do not take double or extra doses. Do not increase or in any way change your dose without consulting your doctor.  What should I watch for while using this medicine? Notify your physician immediately if you become short of breath while doing your normal activities. Do not take this medicine within 6 hours of bedtime. It can keep you from getting to sleep. Avoid drinks that contain caffeine and try to stick to a regular bedtime every night. Do not stand or sit up quickly, especially if you are an older patient. This reduces the risk of dizzy or fainting spells. Avoid alcoholic drinks.  What side  effects may I notice from receiving this medicine? Side effects that you should report to your doctor or health care professional as soon as possible: -chest pain, palpitations -depression or severe changes in mood -increased blood pressure -irritability -nervousness or restlessness -severe dizziness -shortness of breath -problems urinating -unusual swelling of the legs -vomiting  Side effects that usually do not require medical attention (report to your doctor or health care professional if they continue or are bothersome): -blurred vision or other eye problems -changes in sexual ability or desire -constipation or diarrhea -difficulty sleeping -dry mouth or unpleasant taste -headache -nausea This list may not describe all possible side effects. Call your doctor for medical advice about side effects. You may report side effects to FDA at 1-800-FDA-1088.

## 2017-10-21 LAB — CBC WITH DIFFERENTIAL/PLATELET
BASOS ABS: 43 {cells}/uL (ref 0–200)
BASOS PCT: 0.6 %
Eosinophils Absolute: 43 cells/uL (ref 15–500)
Eosinophils Relative: 0.6 %
HEMATOCRIT: 39.1 % (ref 35.0–45.0)
HEMOGLOBIN: 13.3 g/dL (ref 11.7–15.5)
LYMPHS ABS: 2599 {cells}/uL (ref 850–3900)
MCH: 29.6 pg (ref 27.0–33.0)
MCHC: 34 g/dL (ref 32.0–36.0)
MCV: 87.1 fL (ref 80.0–100.0)
MONOS PCT: 7.6 %
MPV: 8.9 fL (ref 7.5–12.5)
NEUTROS ABS: 3967 {cells}/uL (ref 1500–7800)
Neutrophils Relative %: 55.1 %
Platelets: 420 10*3/uL — ABNORMAL HIGH (ref 140–400)
RBC: 4.49 10*6/uL (ref 3.80–5.10)
RDW: 11.7 % (ref 11.0–15.0)
Total Lymphocyte: 36.1 %
WBC mixed population: 547 cells/uL (ref 200–950)
WBC: 7.2 10*3/uL (ref 3.8–10.8)

## 2017-10-21 LAB — HEPATIC FUNCTION PANEL
AG Ratio: 2.2 (calc) (ref 1.0–2.5)
ALKALINE PHOSPHATASE (APISO): 70 U/L (ref 33–130)
ALT: 15 U/L (ref 6–29)
AST: 18 U/L (ref 10–35)
Albumin: 4.6 g/dL (ref 3.6–5.1)
BILIRUBIN DIRECT: 0.1 mg/dL (ref 0.0–0.2)
BILIRUBIN INDIRECT: 0.5 mg/dL (ref 0.2–1.2)
Globulin: 2.1 g/dL (calc) (ref 1.9–3.7)
TOTAL PROTEIN: 6.7 g/dL (ref 6.1–8.1)
Total Bilirubin: 0.6 mg/dL (ref 0.2–1.2)

## 2017-10-21 LAB — BASIC METABOLIC PANEL WITH GFR
BUN: 9 mg/dL (ref 7–25)
CO2: 30 mmol/L (ref 20–32)
Calcium: 9.6 mg/dL (ref 8.6–10.4)
Chloride: 100 mmol/L (ref 98–110)
Creat: 0.99 mg/dL (ref 0.50–1.05)
GFR, EST AFRICAN AMERICAN: 76 mL/min/{1.73_m2} (ref >=60)
GFR, Est Non African American: 66 mL/min/{1.73_m2} (ref >=60)
GLUCOSE: 95 mg/dL (ref 65–99)
POTASSIUM: 3.9 mmol/L (ref 3.5–5.3)
Sodium: 138 mmol/L (ref 135–146)

## 2017-10-21 LAB — IRON, TOTAL/TOTAL IRON BINDING CAP
%SAT: 34 % (ref 11–50)
IRON: 107 ug/dL (ref 45–160)
TIBC: 315 mcg/dL (calc) (ref 250–450)

## 2017-10-21 LAB — TSH: TSH: 2.57 m[IU]/L

## 2017-10-21 LAB — ESTROGENS, TOTAL: Estrogen: 605 pg/mL — ABNORMAL HIGH

## 2017-10-21 LAB — VITAMIN B12: VITAMIN B 12: 570 pg/mL (ref 200–1100)

## 2017-10-21 LAB — TESTOSTERONE, TOTAL, LC/MS/MS: Testosterone, Total, LC-MS-MS: 19 ng/dL (ref 2–45)

## 2017-10-27 ENCOUNTER — Other Ambulatory Visit: Payer: 59

## 2017-10-31 ENCOUNTER — Ambulatory Visit
Admission: RE | Admit: 2017-10-31 | Discharge: 2017-10-31 | Disposition: A | Discharge status: Home / Self Care | Payer: 59 | Source: Ambulatory Visit | Attending: Physician Assistant | Admitting: Physician Assistant

## 2017-10-31 DIAGNOSIS — R140 Abdominal distension (gaseous): Secondary | ICD-10-CM

## 2017-10-31 DIAGNOSIS — R109 Unspecified abdominal pain: Secondary | ICD-10-CM

## 2017-11-02 ENCOUNTER — Ambulatory Visit: Payer: Self-pay | Admitting: Physician Assistant

## 2017-11-07 ENCOUNTER — Other Ambulatory Visit: Payer: Self-pay | Admitting: Physician Assistant

## 2017-11-07 ENCOUNTER — Encounter: Payer: Self-pay | Admitting: Physician Assistant

## 2017-11-07 ENCOUNTER — Ambulatory Visit (INDEPENDENT_AMBULATORY_CARE_PROVIDER_SITE_OTHER): Payer: 59 | Admitting: Physician Assistant

## 2017-11-07 VITALS — Ht 64.0 in

## 2017-11-07 DIAGNOSIS — M722 Plantar fascial fibromatosis: Secondary | ICD-10-CM

## 2017-11-07 DIAGNOSIS — M7712 Lateral epicondylitis, left elbow: Secondary | ICD-10-CM

## 2017-11-07 DIAGNOSIS — K5900 Constipation, unspecified: Secondary | ICD-10-CM

## 2017-11-07 MED ORDER — DEXAMETHASONE SODIUM PHOSPHATE 100 MG/10ML IJ SOLN
20.0000 mg | Freq: Once | INTRAMUSCULAR | Status: AC
  Administered 2017-11-07: 20 mg via INTRAMUSCULAR

## 2017-11-07 NOTE — Patient Instructions (Signed)
Tennis Elbow Tennis elbow is puffiness (inflammation) of the outer tendons of your forearm close to your elbow. Your tendons attach your muscles to your bones. Tennis elbow can happen in any sport or job in which you use your elbow too much. It is caused by doing the same motion over and over. Tennis elbow can cause:  Pain and tenderness in your forearm and the outer part of your elbow.  A burning feeling. This runs from your elbow through your arm.  Weak grip in your hands.  Follow these instructions at home: Activity  Rest your elbow and wrist as told by your doctor. Try to avoid any activities that caused the problem until your doctor says that you can do them again.  If a physical therapist teaches you exercises, do all of them as told.  If you lift an object, lift it with your palm facing up. This is easier on your elbow. Lifestyle  If your tennis elbow is caused by sports, check your equipment and make sure that: ? You are using it correctly. ? It fits you well.  If your tennis elbow is caused by work, take breaks often, if you are able. Talk with your manager about doing your work in a way that is safe for you. ? If your tennis elbow is caused by computer use, talk with your manager about any changes that can be made to your work setup. General instructions  If told, apply ice to the painful area: ? Put ice in a plastic bag. ? Place a towel between your skin and the bag. ? Leave the ice on for 20 minutes, 2-3 times per day.  Take medicines only as told by your doctor.  If you were given a brace, wear it as told by your doctor.  Keep all follow-up visits as told by your doctor. This is important. Contact a doctor if:  Your pain does not get better with treatment.  Your pain gets worse.  You have weakness in your forearm, hand, or fingers.  You cannot feel your forearm, hand, or fingers. This information is not intended to replace advice given to you by your health  care provider. Make sure you discuss any questions you have with your health care provider. Document Released: 01/19/2010 Document Revised: 03/31/2016 Document Reviewed: 07/28/2014 Elsevier Interactive Patient Education  2018 [place] Ask your health care provider which exercises are safe for you. Do exercises exactly as told by your health care provider and adjust them as directed. It is normal to feel mild stretching, pulling, tightness, or discomfort as you do these exercises, but you should stop right away if you feel sudden pain or your pain gets worse. Do not begin these exercises until told by your health care provider. Stretching and range of motion exercises These exercises warm up your muscles and joints and improve the movement and flexibility of your elbow. These exercises also help to relieve pain, numbness, and tingling. Exercise A: Wrist extensor stretch 1. Extend your left / right elbow with your fingers pointing down. 2. Gently pull the palm of your left / right hand toward you until you feel a gentle stretch on the top of your forearm. 3. To increase the stretch, push your left / right hand toward the outer edge or pinkie side of your forearm. 4. Hold this position for __________ seconds. Repeat __________ times. Complete this exercise __________ times a day. If directed by your health care provider, repeat  this stretch except do it with a bent elbow this time. Exercise B: Wrist flexor stretch  1. Extend your left / right elbow and turn your palm upward. 2. Gently pull your left / right palm and fingertips back so your wrist extends and your fingers point more toward the ground. 3. You should feel a gentle stretch on the inside of your forearm. 4. Hold this position for __________ seconds. Repeat __________ times. Complete this exercise __________ times a day. If directed by your health care provider, repeat this stretch except do it with a bent elbow  this time. Strengthening exercises These exercises build strength and endurance in your elbow. Endurance is the ability to use your muscles for a long time, even after they get tired. Exercise C: Wrist extensors  1. Sit with your left / right forearm palm-down and fully supported on a table or countertop. Your elbow should be resting below the height of your shoulder. 2. Let your left / right wrist extend over the edge of the surface. 3. Loosely hold a __________ weight or a piece of rubber exercise band or tubing in your left / right hand. Slowly curl your left / right hand up toward your forearm. If you are using band or tubing, hold the band or tubing in place with your other hand to provide resistance. 4. Hold this position for __________ seconds. 5. Slowly return to the starting position. Repeat __________ times. Complete this exercise __________ times a day. Exercise D: Radial deviators  1. Stand with a __________ weight in your left / righthand. Or, sit while holding a rubber exercise band or tubing with your other arm supported on a table or countertop. Position your hand so your thumb is on top. 2. Raise your hand upward in front of you so your thumb travels toward your forearm, or pull up on the rubber tubing. 3. Hold this position for __________ seconds. 4. Slowly return to the starting position. Repeat __________ times. Complete this exercise __________ times a day. Exercise E: Eccentric wrist extensors 1. Sit with your left / right forearm palm-down and fully supported on a table or countertop. Your elbow should be resting below the height of your shoulder. 2. If told by your health care provider, hold a __________ weight in your hand. 3. Let your left / right wrist extend over the edge of the surface. 4. Use your other hand to lift up your left / right hand toward your forearm. Keep your forearm on the table. 5. Using only the muscles in your left / right hand, slowly lower your  hand back down to the starting position. Repeat __________ times. Complete this exercise __________ times a day. This information is not intended to replace advice given to you by your health care provider. Make sure you discuss any questions you have with your health care provider. Document Released: 08/01/2005 Document Revised: 04/06/2016 Document Reviewed: 04/30/2015 Elsevier Interactive Patient Education  2018 [place]. Plantar Fasciitis Plantar fasciitis is a painful foot condition that affects the heel. It occurs when the band of tissue that connects the toes to the heel bone (plantar fascia) becomes irritated. This can happen after exercising too much or doing other repetitive activities (overuse injury). The pain from plantar fasciitis can range from mild irritation to severe pain that makes it difficult for you to walk or move. The pain is usually worse in the morning or after you have been sitting or lying down for a while. What are the  causes? This condition may be caused by:  Standing for long periods of time.  Wearing shoes that do not fit.  Doing high-impact activities, including running, aerobics, and ballet.  Being overweight.  Having an abnormal way of walking (gait).  Having tight calf muscles.  Having high arches in your feet.  Starting a new athletic activity.  What are the signs or symptoms? The main symptom of this condition is heel pain. Other symptoms include:  Pain that gets worse after activity or exercise.  Pain that is worse in the morning or after resting.  Pain that goes away after you walk for a few minutes.  How is this diagnosed? This condition may be diagnosed based on your signs and symptoms. Your health care provider will also do a physical exam to check for:  A tender area on the bottom of your foot.  A high arch in your foot.  Pain when you move your foot.  Difficulty moving your foot.  You may also need to have imaging studies  to confirm the diagnosis. These can include:  X-rays.  Ultrasound.  MRI.  How is this treated? Treatment for plantar fasciitis depends on the severity of the condition. Your treatment may include:  Rest, ice, and over-the-counter pain medicines to manage your pain.  Exercises to stretch your calves and your plantar fascia.  A splint that holds your foot in a stretched, upward position while you sleep (night splint).  Physical therapy to relieve symptoms and prevent problems in the future.  Cortisone injections to relieve severe pain.  Extracorporeal shock wave therapy (ESWT) to stimulate damaged plantar fascia with electrical impulses. It is often used as a last resort before surgery.  Surgery, if other treatments have not worked after 12 months.  Follow these instructions at home:  Take medicines only as directed by your health care provider.  Avoid activities that cause pain.  Roll the bottom of your foot over a bag of ice or a bottle of cold water. Do this for 20 minutes, 3-4 times a day.  Perform simple stretches as directed by your health care provider.  Try wearing athletic shoes with air-sole or gel-sole cushions or soft shoe inserts.  Wear a night splint while sleeping, if directed by your health care provider.  Keep all follow-up appointments with your health care provider. How is this prevented?  Do not perform exercises or activities that cause heel pain.  Consider finding low-impact activities if you continue to have problems.  Lose weight if you need to. The best way to prevent plantar fasciitis is to avoid the activities that aggravate your plantar fascia. Contact a health care provider if:  Your symptoms do not go away after treatment with home care measures.  Your pain gets worse.  Your pain affects your ability to move or do your daily activities. This information is not intended to replace advice given to you by your health care provider. Make  sure you discuss any questions you have with your health care provider. Document Released: 04/26/2001 Document Revised: 01/04/2016 Document Reviewed: 06/11/2014 Elsevier Interactive Patient Education  [employer].

## 2017-11-07 NOTE — Progress Notes (Signed)
Subjective:    Patient ID: Carla Fuller, female    DOB: 07-22-1966, 52 y.o.   MRN: [id number]  HPI 52 y.o. WF presents with left elbow and right heel pain.  She has had right heel pain x one month, she has done stretches, rubbing ice on it, changed shoes/heel inserts has helped some but still having an issue. Not on NSAID, occ tylenol use. She has been informed of risk of rupture but would like to have the injection still.   She has been having lateral elbow pain x 3 months, no injury to it, worse with carrying objects, trying to grip things over head. Icing helps it. She would like an injection for this as well.   Height 5\' 4"  (1.626 m).  Medications Current Outpatient Medications on File Prior to Visit  Medication Sig  . azelastine (ASTELIN) 0.1 % nasal spray Place 2 sprays into both nostrils 2 (two) times daily. Use in each nostril as directed (Patient taking differently: Place 2 sprays as needed into both nostrils. Use in each nostril as directed)  . Calcium-Magnesium 500-250 MG TABS Take by mouth daily.   . cetirizine (ZYRTEC) 10 MG tablet Take 10 mg by mouth daily.  . Cholecalciferol (VITAMIN D PO) Take 5,000 Units by mouth.  . conjugated estrogens (PREMARIN) vaginal cream Place 1 Applicatorful vaginally daily.  . cyclobenzaprine (FLEXERIL) 5 MG tablet take 1 tablet by mouth at bedtime then take 1 tablet 1 hour PRIOR TO THERAPY (Patient taking differently: as needed. take 1 tablet by mouth at bedtime then take 1 tablet 1 hour PRIOR TO THERAPY)  . doxepin (SINEQUAN) 10 MG capsule Take 1 capsule (10 mg total) by mouth at bedtime as needed.  Marland Kitchen estradiol (ESTRACE) 2 MG tablet Take 2 mg daily by mouth.  . lidocaine (LIDODERM) 5 % Apply 1 patch every 12 hours. Remove & Discard patch within 12 hours or as directed by MD  . lisdexamfetamine (VYVANSE) 50 MG capsule Take 1 capsule (50 mg total) by mouth daily.  . montelukast (SINGULAIR) 10 MG tablet take 1 tablet by mouth once daily  .  phentermine (ADIPEX-P) 37.5 MG tablet Take 1 tablet (37.5 mg total) by mouth daily before breakfast.  . PRISTIQ 50 MG 24 hr tablet Take 1 tablet (50 mg total) by mouth daily.  . progesterone (PROMETRIUM) 100 MG capsule Take 1 capsule daily for 10 days a month. (Patient taking differently: Take 100 mg daily by mouth. Take 1 capsule daily for 10 days a month.)  . RA LAXATIVE 5 MG EC tablet take 1 tablet by mouth daily if needed for MODERATE CONSTIPATION  . rOPINIRole (REQUIP) 1 MG tablet take 1 to 2 tablets by mouth at bedtime for legs   No current facility-administered medications on file prior to visit.     Problem list She has Anemia; Insomnia; Vitamin D deficiency; ADD (attention deficit disorder); Migraines; Allergy; Chronic lumbar pain; Chronic kidney disease (CKD), stage II (mild); Nocturnal leg cramps; Constipation; and Moderate episode of recurrent major depressive disorder (HCC) on their problem list.  Review of Systems See HPI     Objective:   Physical Exam  Constitutional: She is oriented to person, place, and time. She appears well-developed and well-nourished. No distress.  Musculoskeletal:  Left shoulder and left wrist full ROM and nontender.left elbow without erythema, warmth. without minor swelling, with tenderness at lateral epicondyle with decreased grip. Pain with supination against resistance. Good distal pulses, cap refill, and sensation intact.  ROM  Intact bilaterally in upper extremities. Foot exam reveals minimal point tenderness over the inferior aspect of right heel, without masses, deformity or edema.  The rest of the foot and ankle exam is normal. Color and temperature of the feet is normal. Peripheral pulses are normal.   Neurological: She is alert and oriented to person, place, and time.  Skin: Skin is warm and dry. No rash noted.       Assessment & Plan:  Carla Fuller was seen today for acute visit.  Diagnoses and all orders for this visit:  Left lateral  epicondylitis Injection- area cleaned with alcohol, Dexamethasone 10mg  and 1 CC lidocaine injected into lateral epicondyle, tolerated well with immediate relief.  Start NSAIDS, RICE, and exercise given, can get brace If not better with refer to orthopedics.   Plantar fasciitis of right foot Long discussion about risk of injection including but not limited to plantar fascia rupture, infection, bleeding, etc. Patient gave verbal permission. 1 cc lidocaine and 1 cc dexamethasone was injected with 25G 1 inch needle at the inferior aspect of right heel. Patient tolerated well Return precautions given

## 2017-11-09 ENCOUNTER — Other Ambulatory Visit: Payer: Self-pay | Admitting: Physician Assistant

## 2017-11-09 ENCOUNTER — Other Ambulatory Visit: Payer: Self-pay | Admitting: Internal Medicine

## 2017-11-15 ENCOUNTER — Encounter: Payer: Self-pay | Admitting: Physician Assistant

## 2017-11-15 MED ORDER — LISDEXAMFETAMINE DIMESYLATE 70 MG PO CAPS: 70.0000 mg | ORAL_CAPSULE | Freq: Every day | ORAL | 0 refills | Status: DC

## 2017-11-15 MED ORDER — ALPRAZOLAM 0.5 MG PO TABS: 0.5000 mg | ORAL_TABLET | Freq: Every evening | ORAL | 0 refills | Status: DC | PRN

## 2017-11-15 MED ORDER — FLUCONAZOLE 150 MG PO TABS: 150.0000 mg | ORAL_TABLET | Freq: Every day | ORAL | 3 refills | Status: DC

## 2017-11-15 MED ORDER — AZITHROMYCIN 250 MG PO TABS: ORAL_TABLET | ORAL | 1 refills | Status: AC

## 2017-11-15 MED ORDER — SULFAMETHOXAZOLE-TRIMETHOPRIM 800-160 MG PO TABS: 1.0000 | ORAL_TABLET | Freq: Two times a day (BID) | ORAL | 0 refills | Status: DC

## 2017-11-23 ENCOUNTER — Other Ambulatory Visit: Payer: Self-pay | Admitting: Physician Assistant

## 2017-11-23 ENCOUNTER — Encounter: Payer: Self-pay | Admitting: Physician Assistant

## 2017-11-23 MED ORDER — LISDEXAMFETAMINE DIMESYLATE 50 MG PO CAPS: 50.0000 mg | ORAL_CAPSULE | Freq: Every day | ORAL | 0 refills | Status: DC

## 2017-12-06 ENCOUNTER — Other Ambulatory Visit: Payer: Self-pay | Admitting: Adult Health

## 2017-12-06 DIAGNOSIS — K5900 Constipation, unspecified: Secondary | ICD-10-CM

## 2017-12-28 ENCOUNTER — Encounter: Payer: Self-pay | Admitting: Physician Assistant

## 2017-12-28 ENCOUNTER — Other Ambulatory Visit: Payer: Self-pay | Admitting: Adult Health

## 2017-12-28 ENCOUNTER — Other Ambulatory Visit: Payer: Self-pay | Admitting: Physician Assistant

## 2017-12-28 MED ORDER — LISDEXAMFETAMINE DIMESYLATE 70 MG PO CAPS: 70.0000 mg | ORAL_CAPSULE | Freq: Every day | ORAL | 0 refills | Status: DC

## 2017-12-28 MED ORDER — LISDEXAMFETAMINE DIMESYLATE 50 MG PO CAPS: 50.0000 mg | ORAL_CAPSULE | Freq: Every day | ORAL | 0 refills | Status: DC

## 2017-12-28 MED ORDER — PREDNISONE 20 MG PO TABS: ORAL_TABLET | ORAL | 0 refills | Status: DC

## 2018-01-29 ENCOUNTER — Encounter: Payer: Self-pay | Admitting: Adult Health

## 2018-01-30 ENCOUNTER — Encounter: Payer: Self-pay | Admitting: Adult Health

## 2018-01-30 ENCOUNTER — Ambulatory Visit (INDEPENDENT_AMBULATORY_CARE_PROVIDER_SITE_OTHER): Payer: 59 | Admitting: Adult Health

## 2018-01-30 VITALS — BP 102/68 | HR 85 | Temp 97.9°F | Ht 64.0 in | Wt 170.0 lb

## 2018-01-30 DIAGNOSIS — J0140 Acute pansinusitis, unspecified: Secondary | ICD-10-CM

## 2018-01-30 MED ORDER — AZELASTINE-FLUTICASONE 137-50 MCG/ACT NA SUSP: NASAL | 1 refills | Status: DC

## 2018-01-30 MED ORDER — PROMETHAZINE-DM 6.25-15 MG/5ML PO SYRP: 5.0000 mL | ORAL_SOLUTION | Freq: Four times a day (QID) | ORAL | 1 refills | Status: DC | PRN

## 2018-01-30 MED ORDER — PREDNISONE 20 MG PO TABS: ORAL_TABLET | ORAL | 0 refills | Status: DC

## 2018-01-30 MED ORDER — AZITHROMYCIN 250 MG PO TABS: ORAL_TABLET | ORAL | 1 refills | Status: AC

## 2018-01-30 NOTE — Patient Instructions (Addendum)
Consider    HOW TO TREAT VIRAL COUGH AND COLD SYMPTOMS:  -Symptoms usually last at least 1 week with the worst symptoms being around day 4.  - colds usually start with a sore throat and end with a cough, and the cough can take 2 weeks to get better.  -No antibiotics are needed for colds, flu, sore throats, cough, bronchitis UNLESS symptoms are longer than 7 days OR if you are getting better then get drastically worse.  -There are a lot of combination medications (Dayquil, Nyquil, Vicks 44, tyelnol cold and sinus, ETC). Please look at the ingredients on the back so that you are treating the correct symptoms and not doubling up on medications/ingredients.    Medicines you can use  Nasal congestion  Little Remedies saline spray (aerosol/mist)- can try this, it is in the kids section - pseudoephedrine (Sudafed)- behind the counter, do not use if you have high blood pressure, medicine that have -D in them.  - phenylephrine (Sudafed PE) -Dextormethorphan + chlorpheniramine (Coridcidin HBP)- okay if you have high blood pressure -Oxymetazoline (Afrin) nasal spray- LIMIT to 3 days -Saline nasal spray -Neti pot (used distilled or bottled water)  Ear pain/congestion  -pseudoephedrine (sudafed) - Nasonex/flonase nasal spray  Fever  -Acetaminophen (Tyelnol) -Ibuprofen (Advil, motrin, aleve)  Sore Throat  -Acetaminophen (Tyelnol) -Ibuprofen (Advil, motrin, aleve) -Drink a lot of water -Gargle with salt water - Rest your voice (don't talk) -Throat sprays -Cough drops  Body Aches  -Acetaminophen (Tyelnol) -Ibuprofen (Advil, motrin, aleve)  Headache  -Acetaminophen (Tyelnol) -Ibuprofen (Advil, motrin, aleve) - Exedrin, Exedrin Migraine  Allergy symptoms (cough, sneeze, runny nose, itchy eyes) -Claritin or loratadine cheapest but likely the weakest  -Zyrtec or certizine at night because it can make you sleepy -The strongest is allegra or fexafinadine  Cheapest at [employer], [employer],  [employer]  Cough  -Dextromethorphan (Delsym)- medicine that has DM in it -Guafenesin (Mucinex/Robitussin) - cough drops - drink lots of water  Chest Congestion  -Guafenesin (Mucinex/Robitussin)  Red Itchy Eyes  - Naphcon-A  Upset Stomach  - Bland diet (nothing spicy, greasy, fried, and high acid foods like tomatoes, oranges, berries) -OKAY- cereal, bread, soup, crackers, rice -Eat smaller more frequent meals -reduce caffeine, no alcohol -Loperamide (Imodium-AD) if diarrhea -Prevacid for heart burn  General health when sick  -Hydration -wash your hands frequently -keep surfaces clean -change pillow cases and sheets often -Get fresh air but do not exercise strenuously -Vitamin D, double up on it - Vitamin C -Zinc

## 2018-01-30 NOTE — Progress Notes (Signed)
Assessment and Plan:  Carla Fuller was seen today for uri.  Diagnoses and all orders for this visit:  Acute pansinusitis, recurrence not specified Sx <7 days, overall improving- Discussed the importance of avoiding unnecessary antibiotic therapy. Suggested symptomatic OTC remedies. Nasal saline spray for congestion. Continue allergy pill Follow up as needed. -     promethazine-dextromethorphan (PROMETHAZINE-DM) 6.25-15 MG/5ML syrup; Take 5 mLs by mouth 4 (four) times daily as needed for cough. -     predniSONE (DELTASONE) 20 MG tablet; 2 tablets daily for 3 days, 1 tablet daily for 4 days. -     Azelastine-Fluticasone 137-50 MCG/ACT SUSP; Use 1 spray in each nostril once daily while having a flare instead of astalin  Fill and take if not improving in next 2-3 days, symptoms worsening -     azithromycin (ZITHROMAX) 250 MG tablet; Take 2 tablets (500 mg) on  Day 1,  followed by 1 tablet (250 mg) once daily on Days 2 through 5.   Further disposition pending results of labs. Discussed med's effects and SE's.   Over 15 minutes of exam, counseling, chart review, and critical decision making was performed.   Future Appointments  Date Time Provider [place]  06/21/2018 10:00 AM Liane Comber, NP [facility] None    ------------------------------------------------------------------------------------------------------------------   HPI BP 102/68   Pulse 85   Temp 97.9 F (36.6 C)   Ht 5\' 4"  (1.626 m)   Wt 170 lb (77.1 kg)   SpO2 96%   BMI 29.18 kg/m   51 y.o.female with hx of allergies presents for evaluation of URI symptoms; she reports began with left ear pressure and sore throat 1 week prior, became much worse with nonproductive coughing, generalized facial pressure, postnasal drip. She does endorse some upper jaw/teeth aching over the weekend which has resolved. She endorses mild headache, had sensation of fever/chills last night but hasn't checked. She did take norel AD with  tylenol 325 mg this AM.   She has added mucinex and sudafex  She is currently taking zyrtec, astalin, Norel AD.   Past Medical History:  Diagnosis Date  . Abnormal cells of cervix    PAP  . ADD (attention deficit disorder)   . Allergy   . Anemia   . Anxiety   . Chronic lumbar pain    Sacrum  . Insomnia   . Migraines   . Unspecified vitamin D deficiency      Allergies  Allergen Reactions  . Percocet [Oxycodone-Acetaminophen]     Current Outpatient Medications on File Prior to Visit  Medication Sig  . ALPRAZolam (XANAX) 0.5 MG tablet Take 1 tablet (0.5 mg total) by mouth at bedtime as needed for anxiety or sleep.  Marland Kitchen azelastine (ASTELIN) 0.1 % nasal spray Place 2 sprays into both nostrils 2 (two) times daily. Use in each nostril as directed (Patient taking differently: Place 2 sprays as needed into both nostrils. Use in each nostril as directed)  . Calcium-Magnesium 500-250 MG TABS Take by mouth daily.   . cetirizine (ZYRTEC) 10 MG tablet Take 10 mg by mouth daily.  . Cholecalciferol (VITAMIN D PO) Take 5,000 Units by mouth.  . cyclobenzaprine (FLEXERIL) 5 MG tablet Take 1 tablet (5 mg total) by mouth 2 (two) times daily as needed for muscle spasms. TAKE 1 TABLET BY MOUTH AT BEDTIME PRIOR TO THERAPY  . estradiol (ESTRACE) 2 MG tablet Take 2 mg daily by mouth.  . GENTLE LAXATIVE 5 MG EC tablet TAKE 1 TABLET BY MOUTH ONCE  A DAY AS NEEDED FOR MODERATE CONSTIPATION  . lidocaine (LIDODERM) 5 % Apply 1 patch every 12 hours. Remove & Discard patch within 12 hours or as directed by MD  . lisdexamfetamine (VYVANSE) 70 MG capsule Take 1 capsule (70 mg total) by mouth daily.  Marland Kitchen PRISTIQ 50 MG 24 hr tablet Take 1 tablet (50 mg total) by mouth daily.  . progesterone (PROMETRIUM) 100 MG capsule Take 1 capsule daily for 10 days a month. (Patient taking differently: Take 100 mg daily by mouth. Take 1 capsule daily for 10 days a month.)  . rOPINIRole (REQUIP) 1 MG tablet take 1 to 2 tablets by  mouth at bedtime for legs  . Zolpidem Tartrate (AMBIEN PO) Take by mouth.  . conjugated estrogens (PREMARIN) vaginal cream Place 1 Applicatorful vaginally daily. (Patient not taking: Reported on 01/30/2018)  . doxepin (SINEQUAN) 10 MG capsule Take 1 capsule (10 mg total) by mouth at bedtime as needed. (Patient not taking: Reported on 01/30/2018)  . fluconazole (DIFLUCAN) 150 MG tablet Take 1 tablet (150 mg total) by mouth daily.  . phentermine (ADIPEX-P) 37.5 MG tablet Take 1 tablet (37.5 mg total) by mouth daily before breakfast. (Patient not taking: Reported on 01/30/2018)  . predniSONE (DELTASONE) 20 MG tablet 2 tablets daily for 3 days, 1 tablet daily for 4 days. (Patient not taking: Reported on 01/30/2018)  . sulfamethoxazole-trimethoprim (BACTRIM DS,SEPTRA DS) 800-160 MG tablet Take 1 tablet by mouth 2 (two) times daily.   No current facility-administered medications on file prior to visit.     ROS: all negative except above.   Physical Exam:  BP 102/68   Pulse 85   Temp 97.9 F (36.6 C)   Ht 5\' 4"  (1.626 m)   Wt 170 lb (77.1 kg)   SpO2 96%   BMI 29.18 kg/m   General Appearance: Well nourished, in no apparent distress. Eyes: PERRLA, EOMs, conjunctiva no swelling or erythema. Periorbital area mildly puffy/swollen without discharge/injection. Sinuses: Generalized Frontal/maxillary tenderness ENT/Mouth: Ext aud canals clear, TMs without erythema, bulging. No erythema, swelling, or exudate on post pharynx.  Tonsils not swollen or erythematous. Hearing normal.  Neck: Supple.  Respiratory: Respiratory effort normal, BS equal bilaterally without rales, rhonchi, wheezing or stridor.  Cardio: RRR with no MRGs. Brisk peripheral pulses without edema.  Abdomen: Soft, + BS.  Non tender, no guarding, rebound, hernias, masses. Lymphatics: Non tender without lymphadenopathy.  Musculoskeletal: Symmetrical strength, normal gait.  Skin: Warm, dry without rashes, lesions, ecchymosis.  Neuro:  Cranial nerves intact. Normal muscle tone, no cerebellar symptoms. Sensation intact.  Psych: Awake and oriented X 3, normal affect, Insight and Judgment appropriate.     Izora Ribas, NP 8:51 AM [facility]

## 2018-01-31 ENCOUNTER — Other Ambulatory Visit: Payer: Self-pay | Admitting: Physician Assistant

## 2018-01-31 DIAGNOSIS — K5900 Constipation, unspecified: Secondary | ICD-10-CM

## 2018-02-22 ENCOUNTER — Other Ambulatory Visit: Payer: Self-pay | Admitting: Physician Assistant

## 2018-02-22 DIAGNOSIS — R5383 Other fatigue: Secondary | ICD-10-CM

## 2018-02-28 ENCOUNTER — Other Ambulatory Visit: Payer: Self-pay | Admitting: Internal Medicine

## 2018-02-28 ENCOUNTER — Ambulatory Visit (HOSPITAL_COMMUNITY)
Admission: RE | Admit: 2018-02-28 | Discharge: 2018-02-28 | Disposition: A | Discharge status: Home / Self Care | Payer: 59 | Source: Ambulatory Visit | Attending: Internal Medicine | Admitting: Internal Medicine

## 2018-02-28 DIAGNOSIS — M7731 Calcaneal spur, right foot: Secondary | ICD-10-CM | POA: Diagnosis not present

## 2018-02-28 DIAGNOSIS — M79671 Pain in right foot: Secondary | ICD-10-CM

## 2018-03-02 ENCOUNTER — Ambulatory Visit (INDEPENDENT_AMBULATORY_CARE_PROVIDER_SITE_OTHER): Payer: 59 | Admitting: Internal Medicine

## 2018-03-02 DIAGNOSIS — M722 Plantar fascial fibromatosis: Secondary | ICD-10-CM

## 2018-03-04 ENCOUNTER — Encounter: Payer: Self-pay | Admitting: Internal Medicine

## 2018-03-04 MED ORDER — DEXAMETHASONE SODIUM PHOSPHATE 10 MG/ML IJ SOLN: 20.0000 mg | Freq: Once | INTRAMUSCULAR | Status: DC

## 2018-03-04 NOTE — Progress Notes (Signed)
  Subjective:    Patient ID: Carla Fuller, female    DOB: 1966-04-14, 52 y.o.   MRN: [id number]  HPI  This nice 52 yo MWF with prior dx/o plantar fasciitis presents with ongoing pains in the Rt heel/arch area. X-Ray did show a calcaneal spur at her trigger point area. Patient reports pain in ambulation with every step.   Review of Systems  10 point systems review negative except as above.    Objective:   Physical Exam  Exam directed to the Right foot finds a low to medium arch with a trigger point at the classic area - anteromedial to the calcaneous.   After informed consent and aseptic prep with alcohol, the area was infiltrated with 1 cc Marcaine 0.5%  and 2 ml of Dexamethasone (10 mg).     Assessment & Plan:   1. Plantar fasciitis of right foot  - dexamethasone (DECADRON) injection 20 mg

## 2018-03-04 NOTE — Patient Instructions (Addendum)
Plantar Fasciitis  Plantar fasciitis is a painful foot condition that affects the heel. It occurs when the band of tissue that connects the toes to the heel bone (plantar fascia) becomes irritated. This can happen after exercising too much or doing other repetitive activities (overuse injury). The pain from plantar fasciitis can range from mild irritation to severe pain that makes it difficult for you to walk or move. The pain is usually worse in the morning or after you have been sitting or lying down for a while. What are the causes? This condition may be caused by:  Standing for long periods of time.  Wearing shoes that do not fit.  Doing high-impact activities, including running, aerobics, and ballet.  Being overweight.  Having an abnormal way of walking (gait).  Having tight calf muscles.  Having high arches in your feet.  Starting a new athletic activity.  What are the signs or symptoms? The main symptom of this condition is heel pain. Other symptoms include:  Pain that gets worse after activity or exercise.  Pain that is worse in the morning or after resting.  Pain that goes away after you walk for a few minutes.  How is this diagnosed? This condition may be diagnosed based on your signs and symptoms. Your health care provider will also do a physical exam to check for:  A tender area on the bottom of your foot.  A high arch in your foot.  Pain when you move your foot.  Difficulty moving your foot.  You may also need to have imaging studies to confirm the diagnosis. These can include:  X-rays.  Ultrasound.  MRI.  How is this treated? Treatment for plantar fasciitis depends on the severity of the condition. Your treatment may include:  Rest, ice, and over-the-counter pain medicines to manage your pain.  Exercises to stretch your calves and your plantar fascia.  A splint that holds your foot in a stretched, upward position while you sleep (night  splint).  Physical therapy to relieve symptoms and prevent problems in the future.  Cortisone injections to relieve severe pain.  Extracorporeal shock wave therapy (ESWT) to stimulate damaged plantar fascia with electrical impulses. It is often used as a last resort before surgery.  Surgery, if other treatments have not worked after 12 months.  Follow these instructions at home:  Take medicines only as directed by your health care provider.  Avoid activities that cause pain.  Roll the bottom of your foot over a bag of ice or a bottle of cold water. Do this for 20 minutes, 3-4 times a day.  Perform simple stretches as directed by your health care provider.  Try wearing athletic shoes with air-sole or gel-sole cushions or soft shoe inserts.  Wear a night splint while sleeping, if directed by your health care provider.  Keep all follow-up appointments with your health care provider. How is this prevented?  Do not perform exercises or activities that cause heel pain.  Consider finding low-impact activities if you continue to have problems.  Lose weight if you need to. The best way to prevent plantar fasciitis is to avoid the activities that aggravate your plantar fascia. Contact a health care provider if:  Your symptoms do not go away after treatment with home care measures.  Your pain gets worse.  Your pain affects your ability to move or do your daily activities.  ++++++++++++++++++++++++++++++++++ Heel Spur A heel spur is a bony growth that forms on the bottom of your  heel bone (calcaneus). Heel spurs are common and do not always cause pain. However, heel spurs often cause inflammation in the strong band of tissue that runs underneath the bone of your foot (plantar fascia). When this happens, you may feel pain on the bottom of your foot, near your heel. What are the causes? The cause of heel spurs is not completely understood. They may be caused by pressure on the heel.  Or, they may stem from the muscle attachments (tendons) near the spur pulling on the heel. What increases the risk? You may be at risk for a heel spur if you:  Are older than 40.  Are overweight.  Have wear and tear arthritis (osteoarthritis).  Have plantar fascia inflammation.  What are the signs or symptoms? Some people have heel spurs but no symptoms. If you do have symptoms, they may include:  Pain in the bottom of your heel.  Pain that is worse when you first get out of bed.  Pain that gets worse after walking or standing.  How is this diagnosed? Your health care provider may diagnose a heel spur based on your symptoms and a physical exam. You may also have an X-ray of your foot to check for a bony growth coming from the calcaneus. How is this treated? Treatment aims to relieve the pain from the heel spur. This may include:  Stretching exercises.  Losing weight.  Wearing specific shoes, inserts, or orthotics for comfort and support.  Wearing splints at night to properly position your feet.  Taking over-the-counter medicine to relieve pain.  Being treated with high-intensity sound waves to break up the heel spur (extracorporeal shock wave therapy).  Getting steroid injections in your heel to reduce swelling and ease pain.  Having surgery if your heel spur causes long-term (chronic) pain.  Follow these instructions at home:  Take medicines only as directed by your health care provider.  Ask your health care provider if you should use ice or cold packs on the painful areas of your heel or foot.  Avoid activities that cause you pain until you recover or as directed by your health care provider.  Stretch before exercising or being physically active.  Wear supportive shoes that fit well as directed by your health care provider. You might need to buy new shoes. Wearing old shoes or shoes that do not fit correctly may not provide the support that you need.  Lose  weight if your health care provider thinks you should. This can relieve pressure on your foot that may be causing pain and discomfort. Contact a health care provider if:  Your pain continues or gets worse.  ++++++++++++++++++++++++++++++++ "FYI"  Open Plantar Fasciotomy Open plantar fasciotomy is a procedure to cut a band of thick tissue on the bottom of the foot (plantar fascia) to relieve pressure. The plantar fascia connects the heel bone to the base of the toes. If the fascia swells or becomes irritated (plantar fasciitis), this may cause heel pain. You may need this surgery if you have heel pain from plantar fasciitis and other treatments have not helped. Tell a health care provider about:  Any allergies you have.  All medicines you are taking, including vitamins, herbs, eye drops, creams, and over-the-counter medicines.  Any problems you or family members have had with anesthetic medicines.  Any blood disorders you have.  Any surgeries you have had.  Any medical conditions you have. What are the risks? Generally, this is a safe procedure. However, problems may  occur, including:  Bleeding.  Infection.  Foot instability.  Nerve damage.  Painful scarring in the arch of the foot.  A blood clot that forms in the lower leg (deep vein thrombosis). A clot can sometimes break loose and travel to the lung (pulmonary embolism).  The procedure not working.  Allergic reaction to medicines.  What happens before the procedure?  Ask your health care provider about: ? Changing or stopping your regular medicines. This is especially important if you are taking diabetes medicines or blood thinners. ? Taking medicines such as aspirin and ibuprofen. These medicines can thin your blood. Do not take these medicines before your procedure if your health care provider instructs you not to.  Follow instructions from your health care provider about eating or drinking restrictions.  Plan  to have someone take you home after the procedure.  Ask your health care provider how your surgical site will be marked or identified.  You may be given antibiotic medicine to help prevent infection. What happens during the procedure?  To reduce your risk of infection: ? Your health care team will wash or sanitize their hands. ? Your skin will be washed with soap.  An IV tube will be inserted into one of your veins.  You will be given one or more of the following: ? A medicine to help you relax (sedative). ? A medicine to numb the area (local anesthetic). ? A medicine to make you fall asleep (general anesthetic).  Your surgeon will make a small cut (incision) in the sole of your foot. This incision is usually made on the inside area of the sole, just in front of the heel.  The surgeon will extend the incision down to the fascia. Then about half of the fascia layers will be cut, mostly on the inside.  The incision will be closed with stitches (sutures).  A bandage (dressing) will be taped over the bottom of your foot. The procedure may vary among health care providers and hospitals. What happens after the procedure?  Your blood pressure, heart rate, breathing rate, and blood oxygen level will be monitored often until the medicines you were given have worn off.  You will be given pain medicine as needed.  Your health care provider may send you home with a splint, boot, or shoe to wear while your foot heals. You may also be given crutches to help you walk without using your affected foot to support your body weight (bear weight).  Do not drive for 24 hours if you received a sedative.

## 2018-03-22 ENCOUNTER — Other Ambulatory Visit: Payer: Self-pay | Admitting: Internal Medicine

## 2018-04-03 ENCOUNTER — Other Ambulatory Visit: Payer: Self-pay | Admitting: Internal Medicine

## 2018-04-03 MED ORDER — ONDANSETRON HCL 8 MG PO TABS: ORAL_TABLET | ORAL | 1 refills | Status: DC

## 2018-04-09 MED ORDER — AMPHETAMINE-DEXTROAMPHETAMINE 30 MG PO TABS: 30.0000 mg | ORAL_TABLET | Freq: Every day | ORAL | 0 refills | Status: DC

## 2018-04-10 MED ORDER — AMPHETAMINE-DEXTROAMPHETAMINE 30 MG PO TABS: 15.0000 mg | ORAL_TABLET | Freq: Two times a day (BID) | ORAL | 0 refills | Status: DC

## 2018-04-18 MED ORDER — AMPHETAMINE-DEXTROAMPHETAMINE 15 MG PO TABS: 15.0000 mg | ORAL_TABLET | Freq: Two times a day (BID) | ORAL | 0 refills | Status: DC

## 2018-04-19 ENCOUNTER — Other Ambulatory Visit: Payer: Self-pay | Admitting: Adult Health

## 2018-04-19 ENCOUNTER — Telehealth: Payer: Self-pay

## 2018-04-19 MED ORDER — TRIAMCINOLONE ACETONIDE 0.1 % EX OINT: 1.0000 "application " | TOPICAL_OINTMENT | Freq: Two times a day (BID) | CUTANEOUS | 1 refills | Status: DC

## 2018-04-19 NOTE — Telephone Encounter (Signed)
Pharmacy out of adderall 15mg s can you send the refill to [facility] on [address] per pt.  Pt reports she has made you aware so this should serve as a reminder. Thanks!

## 2018-04-20 MED ORDER — AMPHETAMINE-DEXTROAMPHETAMINE 15 MG PO TABS: 15.0000 mg | ORAL_TABLET | Freq: Two times a day (BID) | ORAL | 0 refills | Status: DC

## 2018-04-20 NOTE — Addendum Note (Signed)
Addended by: Vicie Mutters R on: 04/20/2018 05:12 AM   Modules accepted: Orders

## 2018-04-23 ENCOUNTER — Other Ambulatory Visit: Payer: Self-pay | Admitting: Physician Assistant

## 2018-04-23 ENCOUNTER — Other Ambulatory Visit: Payer: Self-pay

## 2018-04-23 DIAGNOSIS — R5383 Other fatigue: Secondary | ICD-10-CM

## 2018-04-23 MED ORDER — DESVENLAFAXINE SUCCINATE ER 50 MG PO TB24: 50.0000 mg | ORAL_TABLET | Freq: Every day | ORAL | 0 refills | Status: DC

## 2018-04-23 MED ORDER — AMPHETAMINE-DEXTROAMPHETAMINE 15 MG PO TABS: 15.0000 mg | ORAL_TABLET | Freq: Two times a day (BID) | ORAL | 0 refills | Status: DC

## 2018-05-16 ENCOUNTER — Ambulatory Visit (INDEPENDENT_AMBULATORY_CARE_PROVIDER_SITE_OTHER): Payer: 59 | Admitting: *Deleted

## 2018-05-16 DIAGNOSIS — Z23 Encounter for immunization: Secondary | ICD-10-CM

## 2018-05-24 HISTORY — PX: TOOTH EXTRACTION: SUR596

## 2018-05-29 ENCOUNTER — Telehealth: Payer: Self-pay | Admitting: Physician Assistant

## 2018-05-29 DIAGNOSIS — J069 Acute upper respiratory infection, unspecified: Secondary | ICD-10-CM

## 2018-05-29 NOTE — Telephone Encounter (Signed)
This is a test.

## 2018-06-08 ENCOUNTER — Other Ambulatory Visit: Payer: Self-pay | Admitting: Physician Assistant

## 2018-06-08 MED ORDER — AMPHETAMINE-DEXTROAMPHETAMINE 15 MG PO TABS: 15.0000 mg | ORAL_TABLET | Freq: Two times a day (BID) | ORAL | 0 refills | Status: DC

## 2018-06-09 ENCOUNTER — Other Ambulatory Visit: Payer: Self-pay | Admitting: Adult Health

## 2018-06-09 DIAGNOSIS — R5383 Other fatigue: Secondary | ICD-10-CM

## 2018-06-20 DIAGNOSIS — E663 Overweight: Secondary | ICD-10-CM | POA: Insufficient documentation

## 2018-06-20 DIAGNOSIS — Z6824 Body mass index (BMI) 24.0-24.9, adult: Secondary | ICD-10-CM | POA: Insufficient documentation

## 2018-06-20 NOTE — Progress Notes (Signed)
Complete Physical  Assessment and Plan:  Carla Fuller was seen today for annual exam.  Diagnoses and all orders for this visit:  Encounter for routine adult physical exam with abnormal findings Defer B12, iron levels as just had last visit and were normal  Hyperlipidemia Mild, treated by lifestyle interventions Continue low cholesterol diet and exercise.  Check lipid panel.  -     Lipid panel  Attention deficit disorder (ADD) without hyperactivity Continue medications Helps with focus, no AE's. The patient was counseled on the addictive nature of the medication and was encouraged to take drug holidays when not needed.   Chronic low back pain, unspecified back pain laterality, with sciatica presence unspecified       -     Chronic tailbone pain; managed by avoidance of pressure and medications as needed. Sees ortho.   Migraine without aura and without status migrainosus, not intractable Recently improved post tooth extration, well managed by OTC analgesics PRN -     Magnesium  Moderate episode of recurrent major depressive disorder (HCC) -     escitalopram (LEXAPRO) 20 MG tablet; Take 1 tablet (20 mg total) daily by mouth. -     Also discussed mindfulness, medication, relaxation techniques and cognitive behavioral therapy -     Follow up in 8-12 weeks to evaluate effect; consider prozac if insufficient  Insomnia, unspecified type       -      Moderately well managed by current medications  Overweight Long discussion about weight loss, diet, and exercise Recommended diet heavy in fruits and veggies and low in animal meats, cheeses, and dairy products, appropriate calorie intake Patient will work on exercise, particularly weight lifting and resistance exercise Follow up at next visit  Vitamin D deficiency -     VITAMIN D 25 Hydroxy (Vit-D Deficiency, Fractures)  Allergic state, sequela      -      Well managed by current medications  Nocturnal leg cramps ABI was normal, CBC,  iron has been normal  Much improved with requip 2 mg in the evening  Right plantar fascitis Has failed conservative interventions and injections x 2 in office, will refer to podiatry per patient request to Dr. [name]  Medication management -     CBC with Differential/Platelet -     CMP/GFR  Screening for cardiovascular condition -     Lipid panel -     EKG 12-Lead  Screening for deficiency anemia -     CBC with Differential/Platelet -     Iron,Total/Total Iron Binding Cap -     Vitamin B12  Screening for hematuria or proteinuria -     Urinalysis, Complete (81001)  Screening for thyroid disorder -     TSH  Screening for diabetes mellitus -     Hemoglobin A1c  Chronic kidney disease (CKD), stage II (mild) -     COMPLETE METABOLIC PANEL WITH GFR   Discussed med's effects and SE's. Screening labs and tests as requested with regular follow-up as recommended. Over 40 minutes of exam, counseling, chart review, and complex, high level critical decision making was performed this visit.   Future Appointments  Date Time Provider [place]  06/26/2019 10:00 AM Liane Comber, NP [facility] None    HPI  52 y.o. married Caucasian female  presents for a complete physical and follow up for has Insomnia; Vitamin D deficiency; ADD (attention deficit disorder); Migraines; Allergy; Chronic lumbar pain; Chronic kidney disease (CKD), stage II (mild); Nocturnal leg  cramps; Constipation; Moderate episode of recurrent major depressive disorder ([place]); and Overweight (BMI 25.0-29.9) on their problem list..   Patient is on an ADD medication, adderall 15 mg BID on work days, tried vyvanse but was too expensie, she states that the current medication is helping and she denies any adverse reactions. She is doing well with current regiment.   She has depression/anxiety, especially centered around her weight.   She has been on pristiq x 6 months, doing well. She was switched to pristiq at last  visit, has failed lexapro, zoloft, wellbutrin. CBT has been discussed but she has not started this. Discussed again today and benefit of combined CBT and medication benefits, she will look into starting this. She has ongoing decreased sex drive, she is on estrogen, progesterone daily, she was also been doing premarin/tesosterone cream but experienced a localized reaction and stopped topical testosterone. She is interested in addyi (flibanserin), discussed this is indicated for premenopausal women.   She reports she has been followed by ortho for cervical facet issues that seem to be contributing to "migraines" - possible tension headaches, triggered by stress- she reports her migraines were worked up extensively without attributable cause. She recently had a tooth extracted and headaches have improved since.   She has ongoing right plantar fascitis despite local injection at this office x 2, stretches and supportive footwear. Discussed referral today, requests to podiatry Dr. [name].   BMI is Body mass index is 28.78 kg/m., she has been working on diet; she is active with her grandkids but otherwise does not exercises intentionally. Weight has been an ongoing struggle.  Wt Readings from Last 3 Encounters:  06/21/18 169 lb (76.7 kg)  01/30/18 170 lb (77.1 kg)  10/17/17 165 lb (74.8 kg)   Today their BP is BP: 102/66   She does workout. She denies chest pain, shortness of breath, dizziness.   She is not on cholesterol medication. Her cholesterol is not at goal. The cholesterol last visit was:   Lab Results  Component Value Date   CHOL 206 (H) 06/20/2017   HDL 69 06/20/2017   LDLCALC 117 (H) 06/20/2017   TRIG 92 06/20/2017   CHOLHDL 3.0 06/20/2017    Last A1C in the office was:  Lab Results  Component Value Date   HGBA1C 5.0 06/20/2017   Last GFR: Lab Results  Component Value Date   GFRNONAA 66 10/17/2017   Patient is on Vitamin D supplement and near goal:   Lab Results  Component  Value Date   VD25OH 61 06/20/2017      Current Medications:  Current Outpatient Medications on File Prior to Visit  Medication Sig Dispense Refill  . ALPRAZolam (XANAX) 0.5 MG tablet Take 1 tablet (0.5 mg total) by mouth at bedtime as needed for anxiety or sleep. 10 tablet 0  . amphetamine-dextroamphetamine (ADDERALL) 15 MG tablet Take 1 tablet by mouth 2 (two) times daily. 60 tablet 0  . azelastine (ASTELIN) 0.1 % nasal spray Place 2 sprays into both nostrils 2 (two) times daily. Use in each nostril as directed (Patient taking differently: Place 2 sprays as needed into both nostrils. Use in each nostril as directed) 30 mL 2  . Azelastine-Fluticasone 137-50 MCG/ACT SUSP Use 1 spray in each nostril once daily while having a flare. 23 g 1  . Calcium-Magnesium 500-250 MG TABS Take by mouth daily.     . cetirizine (ZYRTEC) 10 MG tablet Take 10 mg by mouth daily.    . Cholecalciferol (VITAMIN  D PO) Take 5,000 Units by mouth.    . conjugated estrogens (PREMARIN) vaginal cream Place 1 Applicatorful vaginally daily. 42.5 g 12  . cyclobenzaprine (FLEXERIL) 5 MG tablet Take 1 tablet (5 mg total) by mouth 2 (two) times daily as needed for muscle spasms. TAKE 1 TABLET BY MOUTH AT BEDTIME PRIOR TO THERAPY 180 tablet 1  . desvenlafaxine (PRISTIQ) 50 MG 24 hr tablet TAKE 1 TABLET(50 MG) BY MOUTH DAILY 90 tablet 1  . estradiol (ESTRACE) 2 MG tablet Take 2 mg daily by mouth.    . GENTLE LAXATIVE 5 MG EC tablet TAKE 1 TABLET BY MOUTH ONCE A DAY IF NEEDED FOR MODERATE CONSTIPATION 30 tablet 0  . ondansetron (ZOFRAN) 8 MG tablet Take 1/2 to 1 tablet 2 to 3 x /day for Nausea (Patient taking differently: Take 1/2 to 1 tablet 2 to 3 x /day for Nausea as needed) 30 tablet 1  . progesterone (PROMETRIUM) 100 MG capsule Take 1 capsule daily for 10 days a month. (Patient taking differently: Take 100 mg daily by mouth. Take 1 capsule daily for 10 days a month.) 30 capsule 2  . rOPINIRole (REQUIP) 1 MG tablet take 1 to 2  tablets by mouth at bedtime for legs 180 tablet 3  . triamcinolone cream (KENALOG) 0.1 % Apply 1 application topically 2 (two) times daily.    Marland Kitchen lidocaine (LIDODERM) 5 % Apply 1 patch every 12 hours. Remove & Discard patch within 12 hours or as directed by MD 6 patch 0  . promethazine-dextromethorphan (PROMETHAZINE-DM) 6.25-15 MG/5ML syrup Take 5 mLs by mouth 4 (four) times daily as needed for cough. 240 mL 1  . triamcinolone ointment (KENALOG) 0.1 % Apply 1 application topically 2 (two) times daily. 80 g 1   No current facility-administered medications on file prior to visit.    Allergies:  Allergies  Allergen Reactions  . Percocet [Oxycodone-Acetaminophen]    Medical History:  She has Insomnia; Vitamin D deficiency; ADD (attention deficit disorder); Migraines; Allergy; Chronic lumbar pain; Chronic kidney disease (CKD), stage II (mild); Nocturnal leg cramps; Constipation; Moderate episode of recurrent major depressive disorder ([place]); and Overweight (BMI 25.0-29.9) on their problem list. Health Maintenance:   Immunization History  Administered Date(s) Administered  . Influenza Inj Mdck Quad With Preservative 05/16/2018  . Influenza Split 05/09/2014  . Influenza-Unspecified 05/02/2013  . Pneumococcal-Unspecified 08/15/1997  . Td 08/15/2006  . Tdap 09/15/2012   Tetanus: 2014 Flu vaccine: 2015 Shingrix: Postpone, unavailable in office  LMP: Patient has had an ablation.  Pap: 2019 norm, Dr. Juan Quam office MGM: 1/201, L breast biospy was negative Colonoscopy: 2014, 10 year recommend EGD: n/a  Last Dental Exam: 05/2018 Dr. Christie Nottingham  Last Eye Exam: 2017 Dr. Delight Stare Skin exam: 2019, Dr. Ubaldo Glassing, no concerns  Patient Care Team: Unk Pinto, MD as PCP - General (Internal Medicine) Liane Comber, NP as Nurse Practitioner (Nurse Practitioner) Vicie Mutters, PA-C (Physician Assistant)  Surgical History:  She has a past surgical history that includes Gynecologic cryosurgery;  Novasure ablation; Oophorectomy (Right); Breast enhancement surgery (Bilateral, 1991); Parotidectomy (Left, 09/2008); Breast biopsy (Left, 2018); and Tooth extraction (Right, 05/24/2018). Family History:  Herfamily history includes Breast cancer in her maternal aunt, maternal grandmother, and paternal aunt; Cancer in her father; Depression in her mother; Diabetes in her brother; Heart disease in her father, maternal grandfather, and paternal grandfather; Hypertension in her father; Stroke in her paternal grandfather; Suicidality in her paternal grandmother; Tongue cancer in her father. Social History:  She reports that she has never smoked. She has never used smokeless tobacco. She reports that she drinks alcohol. She reports that she does not use drugs.  Review of Systems: Review of Systems  Constitutional: Negative for chills, diaphoresis, fever, malaise/fatigue and weight loss.  HENT: Negative for ear pain, hearing loss and tinnitus.   Eyes: Negative for blurred vision, double vision, photophobia and pain.  Respiratory: Negative for cough, sputum production, shortness of breath and wheezing.   Cardiovascular: Negative for chest pain, palpitations, orthopnea, claudication and leg swelling.  Gastrointestinal: Negative for abdominal pain, blood in stool, constipation, diarrhea, heartburn, melena, nausea and vomiting.  Genitourinary: Negative.  Negative for dysuria and urgency.  Musculoskeletal: Negative for joint pain, myalgias and neck pain.  Skin: Negative for rash.  Neurological: Negative for dizziness, tingling, sensory change, weakness and headaches.  Endo/Heme/Allergies: Positive for environmental allergies. Negative for polydipsia.  Psychiatric/Behavioral: Positive for depression. Negative for memory loss, substance abuse and suicidal ideas. The patient has insomnia. The patient is not nervous/anxious.   All other systems reviewed and are negative.   Physical Exam: Estimated body mass  index is 28.78 kg/m as calculated from the following:   Height as of this encounter: 5' 4.25" (1.632 m).   Weight as of this encounter: 169 lb (76.7 kg). BP 102/66   Pulse 77   Temp (!) 97.5 F (36.4 C)   Ht 5' 4.25" (1.632 m)   Wt 169 lb (76.7 kg)   SpO2 99%   BMI 28.78 kg/m  General Appearance: Well nourished, in no apparent distress.  Eyes: PERRLA, EOMs, conjunctiva no swelling or erythema, normal fundi and vessels.  Sinuses: No Frontal/maxillary tenderness  ENT/Mouth: Ext aud canals clear, normal light reflex with TMs without erythema, bulging. Good dentition. No erythema, swelling, or exudate on post pharynx. Tonsils not swollen or erythematous. Hearing normal.  Neck: Supple, thyroid normal. No bruits  Respiratory: Respiratory effort normal, BS equal bilaterally without rales, rhonchi, wheezing or stridor.  Cardio: RRR without murmurs, rubs or gallops. Somewhat diminished distal pulses, 1+ bilaterally without edema.  Chest: symmetric, with normal excursions and percussion.  Breasts: Patient requests to defer, sees GYN Abdomen: Soft, BS x 4, no tenderness, no guarding, rebound, hernias, masses, or organomegaly.  Lymphatics: Non tender without lymphadenopathy.  Genitourinary: Defer to GYN Musculoskeletal: Full ROM all peripheral extremities,5/5 strength, and normal gait. Tenderness to R anterior/medial heel Skin: Warm, dry without rashes, lesions, ecchymosis. Neuro: Cranial nerves intact, reflexes diminished throughout though equal bilaterally. Normal muscle tone, no cerebellar symptoms. Sensation intact.  Psych: Awake and oriented X 3, normal affect, Insight and Judgment appropriate.   EKG: WNL no ST changes, RSR' V1 V2  Gorden Harms Kourtlynn Trevor 10:24 AM [facility]

## 2018-06-21 ENCOUNTER — Ambulatory Visit (INDEPENDENT_AMBULATORY_CARE_PROVIDER_SITE_OTHER): Payer: 59 | Admitting: Adult Health

## 2018-06-21 ENCOUNTER — Encounter: Payer: Self-pay | Admitting: Adult Health

## 2018-06-21 VITALS — BP 102/66 | HR 77 | Temp 97.5°F | Ht 64.25 in | Wt 169.0 lb

## 2018-06-21 DIAGNOSIS — E663 Overweight: Secondary | ICD-10-CM

## 2018-06-21 DIAGNOSIS — Z131 Encounter for screening for diabetes mellitus: Secondary | ICD-10-CM

## 2018-06-21 DIAGNOSIS — E559 Vitamin D deficiency, unspecified: Secondary | ICD-10-CM

## 2018-06-21 DIAGNOSIS — D649 Anemia, unspecified: Secondary | ICD-10-CM

## 2018-06-21 DIAGNOSIS — M545 Low back pain: Secondary | ICD-10-CM

## 2018-06-21 DIAGNOSIS — E782 Mixed hyperlipidemia: Secondary | ICD-10-CM

## 2018-06-21 DIAGNOSIS — I10 Essential (primary) hypertension: Secondary | ICD-10-CM | POA: Diagnosis not present

## 2018-06-21 DIAGNOSIS — Z1389 Encounter for screening for other disorder: Secondary | ICD-10-CM

## 2018-06-21 DIAGNOSIS — G4700 Insomnia, unspecified: Secondary | ICD-10-CM

## 2018-06-21 DIAGNOSIS — Z0000 Encounter for general adult medical examination without abnormal findings: Secondary | ICD-10-CM | POA: Diagnosis not present

## 2018-06-21 DIAGNOSIS — N182 Chronic kidney disease, stage 2 (mild): Secondary | ICD-10-CM

## 2018-06-21 DIAGNOSIS — Z136 Encounter for screening for cardiovascular disorders: Secondary | ICD-10-CM | POA: Diagnosis not present

## 2018-06-21 DIAGNOSIS — M722 Plantar fascial fibromatosis: Secondary | ICD-10-CM | POA: Insufficient documentation

## 2018-06-21 DIAGNOSIS — F331 Major depressive disorder, recurrent, moderate: Secondary | ICD-10-CM

## 2018-06-21 DIAGNOSIS — Z0001 Encounter for general adult medical examination with abnormal findings: Secondary | ICD-10-CM

## 2018-06-21 DIAGNOSIS — G8929 Other chronic pain: Secondary | ICD-10-CM

## 2018-06-21 DIAGNOSIS — G43009 Migraine without aura, not intractable, without status migrainosus: Secondary | ICD-10-CM

## 2018-06-21 DIAGNOSIS — T7840XS Allergy, unspecified, sequela: Secondary | ICD-10-CM

## 2018-06-21 DIAGNOSIS — F988 Other specified behavioral and emotional disorders with onset usually occurring in childhood and adolescence: Secondary | ICD-10-CM

## 2018-06-21 DIAGNOSIS — G4762 Sleep related leg cramps: Secondary | ICD-10-CM

## 2018-06-21 DIAGNOSIS — K5900 Constipation, unspecified: Secondary | ICD-10-CM

## 2018-06-21 HISTORY — DX: Plantar fascial fibromatosis: M72.2

## 2018-06-21 NOTE — Patient Instructions (Addendum)
Carla Fuller , Thank you for taking time to come for your Annual Wellness Visit. I appreciate your ongoing commitment to your health goals. Please review the following plan we discussed and let me know if I can assist you in the future.   These are the goals we discussed: Goals    . Exercise 150 min/wk Moderate Activity     Incorporate weights/resistance exercises routinely       This is a list of the screening recommended for you and due dates:  Health Maintenance  Topic Date Due  . Pap Smear  03/02/2018  . HIV Screening  06/22/2019*  . Mammogram  08/26/2019  . Tetanus Vaccine  09/15/2022  . Colon Cancer Screening  03/23/2023  . Flu Shot  Completed  *Topic was postponed. The date shown is not the original due date.     Know what a healthy weight is for you (roughly BMI <25) and aim to maintain this  Aim for 7+ servings of fruits and vegetables daily  65-80+ fluid ounces of water or unsweet tea for healthy kidneys  Limit to max 1 drink of alcohol per day; avoid smoking/tobacco  Limit animal fats in diet for cholesterol and heart health - choose grass fed whenever available  Avoid highly processed foods, and foods high in saturated/trans fats  Aim for low stress - take time to unwind and care for your mental health  Aim for 150 min of moderate intensity exercise weekly for heart health, and weights twice weekly for bone health  Aim for 7-9 hours of sleep daily      Preventing High Cholesterol Cholesterol is a waxy, fat-like substance that your body needs in small amounts. Your liver makes all the cholesterol that your body needs. Having high cholesterol (hypercholesterolemia) increases your risk for heart disease and stroke. Extra (excess) cholesterol comes from the food you eat, such as animal-based fat (saturated fat) from meat and some dairy products. High cholesterol can often be prevented with diet and lifestyle changes. If you already have high cholesterol, you can  control it with diet and lifestyle changes, as well as medicine. What nutrition changes can be made?  Eat less saturated fat. Foods that contain saturated fat include red meat and some dairy products.  Avoid processed meats, like bacon and lunch meats.  Avoid trans fats, which are found in margarine and some baked goods.  Avoid foods and beverages that have added sugars.  Eat more fruits, vegetables, and whole grains.  Choose healthy sources of protein, such as fish, poultry, and nuts.  Choose healthy sources of fat, such as: ? Nuts. ? Vegetable oils, especially olive oil. ? Fish that have healthy fats (omega-3 fatty acids), such as mackerel or salmon. What lifestyle changes can be made?  Lose weight if you are overweight. Losing 5-10 lb (2.3-4.5 kg) can help prevent or control high cholesterol and reduce your risk for diabetes and high blood pressure. Ask your health care provider to help you with a diet and exercise plan to safely lose weight.  Get enough exercise. Do at least 150 minutes of moderate-intensity exercise each week. ? You could do this in short exercise sessions several times a day, or you could do longer exercise sessions a few times a week. For example, you could take a brisk 10-minute walk or bike ride, 3 times a day, for 5 days a week.  Do not smoke. If you need help quitting, ask your health care provider.  Limit your alcohol  intake. If you drink alcohol, limit alcohol intake to no more than 1 drink a day for nonpregnant women and 2 drinks a day for men. One drink equals 12 oz of beer, 5 oz of wine, or 1 oz of hard liquor. Why are these changes important? If you have high cholesterol, deposits (plaques) may build up on the walls of your blood vessels. Plaques make the arteries narrower and stiffer, which can restrict or block blood flow and cause blood clots to form. This greatly increases your risk for heart attack and stroke. Making diet and lifestyle changes can  reduce your risk for these life-threatening conditions. What can I do to lower my risk?  Manage your risk factors for high cholesterol. Talk with your health care provider about all of your risk factors and how to lower your risk.  Manage other conditions that you have, such as diabetes or high blood pressure (hypertension).  Have your cholesterol checked at regular intervals.  Keep all follow-up visits as told by your health care provider. This is important. How is this treated? In addition to diet and lifestyle changes, your health care provider may recommend medicines to help lower cholesterol, such as a medicine to reduce the amount of cholesterol made in your liver. You may need medicine if:  Diet and lifestyle changes do not lower your cholesterol enough.  You have high cholesterol and other risk factors for heart disease or stroke.  Take over-the-counter and prescription medicines only as told by your health care provider. Where to find more information:  American Heart Association: ThisTune.com.pt.jsp  National Heart, Lung, and Blood Institute: FrenchToiletries.com.cy Summary  High cholesterol increases your risk for heart disease and stroke. By keeping your cholesterol level low, you can reduce your risk for these conditions.  Diet and lifestyle changes are the most important steps in preventing high cholesterol.  Work with your health care provider to manage your risk factors, and have your blood tested regularly. This information is not intended to replace advice given to you by your health care provider. Make sure you discuss any questions you have with your health care provider. Document Released: 08/16/2015 Document Revised: 04/09/2016 Document Reviewed: 04/09/2016 Elsevier Interactive Patient Education  [employer].

## 2018-06-22 LAB — URINALYSIS W MICROSCOPIC + REFLEX CULTURE
Bacteria, UA: NONE SEEN /HPF
Bilirubin Urine: NEGATIVE
Glucose, UA: NEGATIVE
Hgb urine dipstick: NEGATIVE
Hyaline Cast: NONE SEEN /LPF
KETONES UR: NEGATIVE
Leukocyte Esterase: NEGATIVE
Nitrites, Initial: NEGATIVE
PH: 7 (ref 5.0–8.0)
Protein, ur: NEGATIVE
RBC / HPF: NONE SEEN /HPF (ref 0–2)
SPECIFIC GRAVITY, URINE: 1.004 (ref 1.001–1.03)
Squamous Epithelial / LPF: NONE SEEN /HPF (ref <=5)
WBC UA: NONE SEEN /HPF (ref 0–5)

## 2018-06-22 LAB — CBC WITH DIFFERENTIAL/PLATELET
BASOS PCT: 0.6 %
Basophils Absolute: 32 cells/uL (ref 0–200)
EOS ABS: 32 {cells}/uL (ref 15–500)
Eosinophils Relative: 0.6 %
HEMATOCRIT: 39.1 % (ref 35.0–45.0)
Hemoglobin: 13.4 g/dL (ref 11.7–15.5)
LYMPHS ABS: 2088 {cells}/uL (ref 850–3900)
MCH: 29.5 pg (ref 27.0–33.0)
MCHC: 34.3 g/dL (ref 32.0–36.0)
MCV: 85.9 fL (ref 80.0–100.0)
MPV: 8.8 fL (ref 7.5–12.5)
Monocytes Relative: 7.8 %
NEUTROS PCT: 51.6 %
Neutro Abs: 2735 cells/uL (ref 1500–7800)
Platelets: 370 10*3/uL (ref 140–400)
RBC: 4.55 10*6/uL (ref 3.80–5.10)
RDW: 12.5 % (ref 11.0–15.0)
Total Lymphocyte: 39.4 %
WBC: 5.3 10*3/uL (ref 3.8–10.8)
WBCMIX: 413 {cells}/uL (ref 200–950)

## 2018-06-22 LAB — COMPLETE METABOLIC PANEL WITH GFR
AG RATIO: 1.9 (calc) (ref 1.0–2.5)
ALT: 12 U/L (ref 6–29)
AST: 14 U/L (ref 10–35)
Albumin: 4.4 g/dL (ref 3.6–5.1)
Alkaline phosphatase (APISO): 73 U/L (ref 33–130)
BUN: 10 mg/dL (ref 7–25)
CALCIUM: 9.7 mg/dL (ref 8.6–10.4)
CO2: 29 mmol/L (ref 20–32)
CREATININE: 0.87 mg/dL (ref 0.50–1.05)
Chloride: 104 mmol/L (ref 98–110)
GFR, EST NON AFRICAN AMERICAN: 77 mL/min/{1.73_m2} (ref >=60)
GFR, Est African American: 89 mL/min/{1.73_m2} (ref >=60)
GLOBULIN: 2.3 g/dL (ref 1.9–3.7)
Glucose, Bld: 96 mg/dL (ref 65–99)
Potassium: 4.5 mmol/L (ref 3.5–5.3)
SODIUM: 139 mmol/L (ref 135–146)
Total Bilirubin: 0.4 mg/dL (ref 0.2–1.2)
Total Protein: 6.7 g/dL (ref 6.1–8.1)

## 2018-06-22 LAB — HEMOGLOBIN A1C
HEMOGLOBIN A1C: 5.2 %{Hb} (ref <5.7)
MEAN PLASMA GLUCOSE: 103 (calc)
eAG (mmol/L): 5.7 (calc)

## 2018-06-22 LAB — MICROALBUMIN / CREATININE URINE RATIO
Creatinine, Urine: 24 mg/dL (ref 20–275)
Microalb, Ur: <0.2 mg/dL

## 2018-06-22 LAB — LIPID PANEL
CHOL/HDL RATIO: 3.8 (calc) (ref <5.0)
CHOLESTEROL: 195 mg/dL (ref <200)
HDL: 51 mg/dL (ref >50)
LDL CHOLESTEROL (CALC): 123 mg/dL — AB
Non-HDL Cholesterol (Calc): 144 mg/dL (calc) — ABNORMAL HIGH (ref <130)
Triglycerides: 107 mg/dL (ref <150)

## 2018-06-22 LAB — MAGNESIUM: Magnesium: 2.1 mg/dL (ref 1.5–2.5)

## 2018-06-22 LAB — VITAMIN D 25 HYDROXY (VIT D DEFICIENCY, FRACTURES): Vit D, 25-Hydroxy: 69 ng/mL (ref 30–100)

## 2018-06-22 LAB — NO CULTURE INDICATED

## 2018-06-22 LAB — TSH: TSH: 2.98 m[IU]/L

## 2018-07-16 ENCOUNTER — Other Ambulatory Visit: Payer: Self-pay | Admitting: Adult Health

## 2018-07-16 MED ORDER — FLIBANSERIN 100 MG PO TABS: 100.0000 mg | ORAL_TABLET | Freq: Every day | ORAL | 2 refills | Status: DC

## 2018-07-16 NOTE — Progress Notes (Signed)
Patient reports ongoing low libido despite lifestyle modification, adjustment of mood medications. Strong preference to try new medication addyi; risks and contraindications discussed, will trial for 8 weeks and evaluate response.

## 2018-07-17 ENCOUNTER — Encounter: Payer: Self-pay | Admitting: Podiatry

## 2018-07-17 ENCOUNTER — Ambulatory Visit (INDEPENDENT_AMBULATORY_CARE_PROVIDER_SITE_OTHER): Payer: 59

## 2018-07-17 ENCOUNTER — Ambulatory Visit: Payer: 59 | Admitting: Podiatry

## 2018-07-17 VITALS — BP 116/66 | HR 108 | Resp 16

## 2018-07-17 DIAGNOSIS — M722 Plantar fascial fibromatosis: Secondary | ICD-10-CM

## 2018-07-17 MED ORDER — MELOXICAM 15 MG PO TABS: 15.0000 mg | ORAL_TABLET | Freq: Every day | ORAL | 3 refills | Status: DC

## 2018-07-17 MED ORDER — METHYLPREDNISOLONE 4 MG PO TBPK: ORAL_TABLET | ORAL | 0 refills | Status: DC

## 2018-07-17 NOTE — Patient Instructions (Signed)

## 2018-07-17 NOTE — Progress Notes (Signed)
Subjective:  Patient ID: Carla Fuller, female    DOB: [birth year],  MRN: [id number] HPI Chief Complaint  Patient presents with  . Foot Pain    Plantar heel right and forefoot bilateral - aching, cramping in toes x several months, cramping worse at night "draws the whole foot", xrayed at [facility] twice, said had bone spur, Dr. Donnetta Hutching checked vascular - negative findings, tried new shoes, stretches, OTC meds- no help  . New Patient (Initial Visit)    52 y.o. female presents with the above complaint.   ROS: Denies fever chills nausea vomiting muscle aches pains back pain chest pain shortness of breath.  Past Medical History:  Diagnosis Date  . Abnormal cells of cervix    PAP  . ADD (attention deficit disorder)   . Allergy   . Anemia   . Anxiety   . Chronic lumbar pain    Sacrum  . Insomnia   . Migraines   . Unspecified vitamin D deficiency    Past Surgical History:  Procedure Laterality Date  . BREAST BIOPSY Left 2018  . BREAST ENHANCEMENT SURGERY Bilateral 1991   Saline implants  . GYNECOLOGIC CRYOSURGERY    . NOVASURE ABLATION    . OOPHORECTOMY Right   . PAROTIDECTOMY Left 09/2008   benign  . TOOTH EXTRACTION Right 05/24/2018   #31 extraction    Current Outpatient Medications:  .  amphetamine-dextroamphetamine (ADDERALL) 15 MG tablet, Take 1 tablet by mouth 2 (two) times daily., Disp: 60 tablet, Rfl: 0 .  azelastine (ASTELIN) 0.1 % nasal spray, Place 2 sprays into both nostrils 2 (two) times daily. Use in each nostril as directed (Patient taking differently: Place 2 sprays as needed into both nostrils. Use in each nostril as directed), Disp: 30 mL, Rfl: 2 .  Calcium-Magnesium 500-250 MG TABS, Take by mouth daily. , Disp: , Rfl:  .  cetirizine (ZYRTEC) 10 MG tablet, Take 10 mg by mouth daily., Disp: , Rfl:  .  Cholecalciferol (VITAMIN D PO), Take 5,000 Units by mouth., Disp: , Rfl:  .  conjugated estrogens (PREMARIN) vaginal cream, Place 1 Applicatorful vaginally  daily., Disp: 42.5 g, Rfl: 12 .  desvenlafaxine (PRISTIQ) 50 MG 24 hr tablet, TAKE 1 TABLET(50 MG) BY MOUTH DAILY, Disp: 90 tablet, Rfl: 1 .  estradiol (ESTRACE) 2 MG tablet, Take 2 mg daily by mouth., Disp: , Rfl:  .  Flibanserin (ADDYI) 100 MG TABS, Take 100 mg by mouth at bedtime., Disp: 30 tablet, Rfl: 2 .  meloxicam (MOBIC) 15 MG tablet, Take 1 tablet (15 mg total) by mouth daily., Disp: 30 tablet, Rfl: 3 .  methylPREDNISolone (MEDROL DOSEPAK) 4 MG TBPK tablet, 6 day dose pack - take as directed, Disp: 21 tablet, Rfl: 0 .  progesterone (PROMETRIUM) 100 MG capsule, Take 1 capsule daily for 10 days a month. (Patient taking differently: Take 100 mg daily by mouth. Take 1 capsule daily for 10 days a month.), Disp: 30 capsule, Rfl: 2 .  rOPINIRole (REQUIP) 1 MG tablet, take 1 to 2 tablets by mouth at bedtime for legs, Disp: 180 tablet, Rfl: 3 .  triamcinolone cream (KENALOG) 0.1 %, Apply 1 application topically 2 (two) times daily., Disp: , Rfl:  .  zolpidem (AMBIEN) 10 MG tablet, TK 1 T PO QD HS, Disp: , Rfl: 5  Allergies  Allergen Reactions  . Percocet [Oxycodone-Acetaminophen]    Review of Systems Objective:   Vitals:   07/17/18 1129  BP: 116/66  Pulse: (!) 108  Resp:  16    General: Well developed, nourished, in no acute distress, alert and oriented x3   Dermatological: Skin is warm, dry and supple bilateral. Nails x 10 are well maintained; remaining integument appears unremarkable at this time. There are no open sores, no preulcerative lesions, no rash or signs of infection present.  Vascular: Dorsalis Pedis artery and Posterior Tibial artery pedal pulses are 2/4 bilateral with immedate capillary fill time. Pedal hair growth present. No varicosities and no lower extremity edema present bilateral.   Neruologic: Grossly intact via light touch bilateral. Vibratory intact via tuning fork bilateral. Protective threshold with Semmes Wienstein monofilament intact to all pedal sites  bilateral. Patellar and Achilles deep tendon reflexes 2+ bilateral. No Babinski or clonus noted bilateral.   Musculoskeletal: No gross boney pedal deformities bilateral. No pain, crepitus, or limitation noted with foot and ankle range of motion bilateral. Muscular strength 5/5 in all groups tested bilateral.  Gait: Unassisted, Nonantalgic.    Radiographs:  Radiographs taken today demonstrate soft tissue increase in density plantar fashion calcaneal insertion site with a small plantar distally oriented calcaneal heel spur right over left.  Assessment & Plan:   Assessment: Nocturnal cramps possibly associated with sciatic issues.  Plantar fasciitis right foot.  Plan: Discussed etiology pathology conservative versus surgical therapies.  At this point after sterile Betadine skin prep I injected 20 mg Kenalog 5 g Marcaine point maximal tenderness.  Posterior and plantar fascial brace.  Started her on a Medrol Dosepak to be followed by meloxicam.  Discussed the possible use of quinine water and therapy works cream.  May need to consider orthotics as well as neurological evaluation.      T. [place], [place]

## 2018-07-24 ENCOUNTER — Other Ambulatory Visit: Payer: Self-pay | Admitting: Physician Assistant

## 2018-07-24 MED ORDER — CYCLOBENZAPRINE HCL 10 MG PO TABS: 10.0000 mg | ORAL_TABLET | Freq: Two times a day (BID) | ORAL | 0 refills | Status: DC | PRN

## 2018-07-24 MED ORDER — AZITHROMYCIN 250 MG PO TABS: ORAL_TABLET | ORAL | 0 refills | Status: DC

## 2018-07-24 MED ORDER — ONDANSETRON HCL 4 MG PO TABS: 4.0000 mg | ORAL_TABLET | Freq: Every day | ORAL | 1 refills | Status: DC | PRN

## 2018-07-24 MED ORDER — PREDNISONE 20 MG PO TABS: ORAL_TABLET | ORAL | 0 refills | Status: DC

## 2018-07-24 MED ORDER — AMPHETAMINE-DEXTROAMPHETAMINE 15 MG PO TABS: 15.0000 mg | ORAL_TABLET | Freq: Two times a day (BID) | ORAL | 0 refills | Status: DC

## 2018-08-14 ENCOUNTER — Other Ambulatory Visit: Payer: Self-pay | Admitting: Physician Assistant

## 2018-08-17 DIAGNOSIS — E348 Other specified endocrine disorders: Secondary | ICD-10-CM

## 2018-08-17 DIAGNOSIS — M8580 Other specified disorders of bone density and structure, unspecified site: Secondary | ICD-10-CM | POA: Insufficient documentation

## 2018-08-21 ENCOUNTER — Other Ambulatory Visit: Payer: Self-pay | Admitting: Internal Medicine

## 2018-08-21 DIAGNOSIS — Z1231 Encounter for screening mammogram for malignant neoplasm of breast: Secondary | ICD-10-CM

## 2018-08-23 ENCOUNTER — Encounter: Payer: Self-pay | Admitting: Podiatry

## 2018-08-23 ENCOUNTER — Ambulatory Visit (INDEPENDENT_AMBULATORY_CARE_PROVIDER_SITE_OTHER): Payer: 59 | Admitting: Podiatry

## 2018-08-23 DIAGNOSIS — M722 Plantar fascial fibromatosis: Secondary | ICD-10-CM

## 2018-08-23 NOTE — Progress Notes (Signed)
She presents today for follow-up of plantar fasciitis to the right foot states that is 100% resolved.  She also still continues to have cramping in the middle of the night.  She states it is some better using the quinine but has not improved considerably.  Objective: Vital signs are stable alert oriented x3.  Pulses are palpable.  No pain on palpation medial calcaneal tubercle.  She does have calluses to the forefoot which may be resulting in stress or strain of the forefoot causing some of the symptoms that she has nocturnally.  Assessment: Well-healing plantar fasciitis.  Forefoot metatarsalgia.  Cramping nocturnal.  Plan: At this point sent her for orthotics and also started her on cyclobenzaprine.  We will follow-up with her after the orthotics come in.

## 2018-09-08 ENCOUNTER — Other Ambulatory Visit: Payer: Self-pay | Admitting: Physician Assistant

## 2018-09-09 ENCOUNTER — Other Ambulatory Visit: Payer: Self-pay | Admitting: Physician Assistant

## 2018-09-10 ENCOUNTER — Other Ambulatory Visit: Payer: Self-pay

## 2018-09-10 MED ORDER — AMPHETAMINE-DEXTROAMPHETAMINE 15 MG PO TABS: 15.0000 mg | ORAL_TABLET | Freq: Two times a day (BID) | ORAL | 0 refills | Status: DC

## 2018-09-10 MED ORDER — DICLOFENAC EPOLAMINE 1.3 % TD PTCH: 1.0000 | MEDICATED_PATCH | Freq: Two times a day (BID) | TRANSDERMAL | 2 refills | Status: DC

## 2018-09-20 ENCOUNTER — Ambulatory Visit: Payer: 59 | Admitting: Orthotics

## 2018-09-20 DIAGNOSIS — M722 Plantar fascial fibromatosis: Secondary | ICD-10-CM

## 2018-09-20 NOTE — Progress Notes (Signed)
Patient came in today to p/up functional foot orthotics.   The orthotics were assessed to both fit and function.  The F/O addressed the biomechanical issues/pathologies as intended, offering good longitudinal arch support, proper offloading, and foot support. There weren't any signs of discomfort or irritation.  The F/O fit properly in footwear with minimal trimming/adjustments. 

## 2018-10-12 ENCOUNTER — Ambulatory Visit: Payer: 59

## 2018-10-12 ENCOUNTER — Other Ambulatory Visit: Payer: 59

## 2018-10-29 ENCOUNTER — Other Ambulatory Visit: Payer: Self-pay | Admitting: Internal Medicine

## 2018-10-29 ENCOUNTER — Other Ambulatory Visit: Payer: Self-pay

## 2018-10-29 MED ORDER — AMPHETAMINE-DEXTROAMPHETAMINE 15 MG PO TABS: ORAL_TABLET | ORAL | 0 refills | Status: DC

## 2018-10-29 MED ORDER — HYDROXYCHLOROQUINE SULFATE 200 MG PO TABS: ORAL_TABLET | ORAL | 1 refills | Status: DC

## 2018-10-29 MED ORDER — AZITHROMYCIN 250 MG PO TABS: ORAL_TABLET | ORAL | 1 refills | Status: DC

## 2018-12-08 ENCOUNTER — Other Ambulatory Visit: Payer: Self-pay | Admitting: Internal Medicine

## 2018-12-08 DIAGNOSIS — R5383 Other fatigue: Secondary | ICD-10-CM

## 2019-01-23 ENCOUNTER — Other Ambulatory Visit: Payer: Self-pay | Admitting: Adult Health

## 2019-01-23 MED ORDER — AMPHETAMINE-DEXTROAMPHETAMINE 15 MG PO TABS: ORAL_TABLET | ORAL | 0 refills | Status: DC

## 2019-02-20 ENCOUNTER — Other Ambulatory Visit: Payer: Self-pay | Admitting: Physician Assistant

## 2019-02-20 MED ORDER — AMPHETAMINE-DEXTROAMPHETAMINE 15 MG PO TABS: ORAL_TABLET | ORAL | 0 refills | Status: DC

## 2019-02-20 MED ORDER — FUROSEMIDE 20 MG PO TABS: 20.0000 mg | ORAL_TABLET | Freq: Every day | ORAL | 11 refills | Status: DC | PRN

## 2019-03-21 ENCOUNTER — Ambulatory Visit: Payer: 59 | Admitting: Internal Medicine

## 2019-03-25 ENCOUNTER — Ambulatory Visit: Payer: 59 | Admitting: Adult Health

## 2019-05-01 ENCOUNTER — Other Ambulatory Visit: Payer: Self-pay | Admitting: Physician Assistant

## 2019-05-01 DIAGNOSIS — E2839 Other primary ovarian failure: Secondary | ICD-10-CM

## 2019-05-03 ENCOUNTER — Other Ambulatory Visit: Payer: Self-pay

## 2019-05-03 ENCOUNTER — Ambulatory Visit
Admission: RE | Admit: 2019-05-03 | Discharge: 2019-05-03 | Disposition: A | Discharge status: Home / Self Care | Payer: 59 | Source: Ambulatory Visit | Attending: Internal Medicine | Admitting: Internal Medicine

## 2019-05-03 ENCOUNTER — Ambulatory Visit
Admission: RE | Admit: 2019-05-03 | Discharge: 2019-05-03 | Disposition: A | Discharge status: Home / Self Care | Payer: 59 | Source: Ambulatory Visit | Attending: Physician Assistant | Admitting: Physician Assistant

## 2019-05-03 DIAGNOSIS — Z1231 Encounter for screening mammogram for malignant neoplasm of breast: Secondary | ICD-10-CM

## 2019-05-03 DIAGNOSIS — E2839 Other primary ovarian failure: Secondary | ICD-10-CM

## 2019-05-06 ENCOUNTER — Other Ambulatory Visit: Payer: Self-pay | Admitting: Internal Medicine

## 2019-05-06 DIAGNOSIS — N6310 Unspecified lump in the right breast, unspecified quadrant: Secondary | ICD-10-CM

## 2019-05-09 ENCOUNTER — Ambulatory Visit
Admission: RE | Admit: 2019-05-09 | Discharge: 2019-05-09 | Disposition: A | Discharge status: Home / Self Care | Payer: 59 | Source: Ambulatory Visit | Attending: Internal Medicine | Admitting: Internal Medicine

## 2019-05-09 ENCOUNTER — Other Ambulatory Visit: Payer: Self-pay

## 2019-05-09 DIAGNOSIS — N6310 Unspecified lump in the right breast, unspecified quadrant: Secondary | ICD-10-CM

## 2019-05-10 ENCOUNTER — Other Ambulatory Visit: Payer: 59

## 2019-05-22 ENCOUNTER — Other Ambulatory Visit: Payer: Self-pay

## 2019-05-22 ENCOUNTER — Ambulatory Visit (INDEPENDENT_AMBULATORY_CARE_PROVIDER_SITE_OTHER): Payer: 59

## 2019-05-22 VITALS — Temp 97.3°F

## 2019-05-22 DIAGNOSIS — Z23 Encounter for immunization: Secondary | ICD-10-CM | POA: Diagnosis not present

## 2019-05-22 NOTE — Progress Notes (Signed)
REPORTS for LOW DOSE FLU

## 2019-06-24 ENCOUNTER — Encounter: Payer: Self-pay | Admitting: Adult Health

## 2019-06-24 DIAGNOSIS — E782 Mixed hyperlipidemia: Secondary | ICD-10-CM | POA: Insufficient documentation

## 2019-06-24 NOTE — Progress Notes (Deleted)
Complete Physical  Assessment and Plan:  Carla Fuller was seen today for annual exam.  Diagnoses and all orders for this visit:  Encounter for routine adult physical exam with abnormal findings  Hyperlipidemia Mild, treated by lifestyle interventions Continue low cholesterol diet and exercise.  Check lipid panel.  -     Lipid panel  Attention deficit disorder (ADD) without hyperactivity Continue medications Helps with focus, no AE's. The patient was counseled on the addictive nature of the medication and was encouraged to take drug holidays when not needed.   Chronic low back pain, unspecified back pain laterality, with sciatica presence unspecified       -     Chronic tailbone pain; managed by avoidance of pressure and medications as needed. Sees ortho.   Migraine without aura and without status migrainosus, not intractable Recently improved post tooth extration, well managed by OTC analgesics PRN -     Magnesium  Moderate episode of recurrent major depressive disorder (HCC) -     Continue pristiq -     Also discussed mindfulness, medication, relaxation techniques and recommended cognitive behavioral therapy  Insomnia, unspecified type       -      Moderately well managed by current medications  Overweight Long discussion about weight loss, diet, and exercise Recommended diet heavy in fruits and veggies and low in animal meats, cheeses, and dairy products, appropriate calorie intake Patient will work on exercise, particularly weight lifting and resistance exercise Follow up at next visit  Vitamin D deficiency -     VITAMIN D 25 Hydroxy (Vit-D Deficiency, Fractures)  Allergic state, sequela      -      Well managed by current medications  Nocturnal leg cramps ABI was normal, CBC, iron has been normal  Much improved with requip 2 mg in the evening  Right plantar fascitis Has failed conservative interventions and injections x 2 in office, will refer to podiatry per  patient request to Dr. [name]  Medication management -     CBC with Differential/Platelet -     CMP/GFR  Screening for cardiovascular condition -     Lipid panel -     EKG 12-Lead  Screening for hematuria or proteinuria -     Urinalysis, Complete (81001)  Screening for thyroid disorder -     TSH  Screening for diabetes mellitus -     Hemoglobin A1c  Chronic kidney disease (CKD), stage II (mild) -     COMPLETE METABOLIC PANEL WITH GFR   Discussed med's effects and SE's. Screening labs and tests as requested with regular follow-up as recommended. Over 40 minutes of exam, counseling, chart review, and complex, high level critical decision making was performed this visit.   Future Appointments  Date Time Provider [place]  06/26/2019 10:00 AM Liane Comber, NP [facility] None  06/25/2020 10:00 AM Liane Comber, NP [facility] None    HPI  53 y.o. married Caucasian female  presents for a complete physical and follow up for has Insomnia; Vitamin D deficiency; ADD (attention deficit disorder); Migraines; Allergy; Chronic lumbar pain; Nocturnal leg cramps; Moderate episode of recurrent major depressive disorder ([place]); Overweight (BMI 25.0-29.9); Plantar fasciitis of right foot; and Osteopenia on their problem list..   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   Follows with GYN ***, on HRT ***  *** plaquenil   Patient is on an ADD medication, adderall 15 mg BID on work days, tried vyvanse but  was too expensive, she states that the current medication is helping and she denies any adverse reactions. She is doing well with current regiment.   She has depression/anxiety, especially centered around her weight.   She has dx of moderate depression and has been on pristiq x 18 months and ***.  She was switched to pristiq at last year, has failed lexapro, zoloft, wellbutrin.   She has ongoing decreased sex drive, she is on estrogen, progesterone  daily ***via GYN? , she was also been doing premarin/tesosterone cream but experienced a localized reaction and stopped topical testosterone.   She is prescribed 5 mg ambien PRN insomnia ***  She reports she has been followed by ortho for cervical facet issues that seem to be contributing to "migraines" - possible tension headaches, triggered by stress- she reports her migraines were worked up extensively without attributable cause. She reported headaches have improved since after tooth extraction.   She has ongoing right plantar fascitis despite numerous steroid injections and was referred to podiatry Dr. [name]. ***  She has leg cramps at night, normal iron, on requip ***  BMI is There is no height or weight on file to calculate BMI., she has been working on diet; she is active with her grandkids but otherwise does not exercises intentionally. Weight has been an ongoing struggle.  Wt Readings from Last 3 Encounters:  06/21/18 169 lb (76.7 kg)  01/30/18 170 lb (77.1 kg)  10/17/17 165 lb (74.8 kg)   Today their BP is     She does workout. She denies chest pain, shortness of breath, dizziness.   She is not on cholesterol medication. Her cholesterol is not at goal. The cholesterol last visit was:   Lab Results  Component Value Date   CHOL 195 06/21/2018   HDL 51 06/21/2018   LDLCALC 123 (H) 06/21/2018   TRIG 107 06/21/2018   CHOLHDL 3.8 06/21/2018    Last A1C in the office was:  Lab Results  Component Value Date   HGBA1C 5.2 06/21/2018   Last GFR: Lab Results  Component Value Date   GFRNONAA 77 06/21/2018   Patient is on Vitamin D supplement and at goal:   Lab Results  Component Value Date   VD25OH 69 06/21/2018        Current Medications:  Current Outpatient Medications on File Prior to Visit  Medication Sig Dispense Refill  . ALPRAZolam (XANAX) 0.5 MG tablet Take 1/2-1 tablet at hour of Sleep ONLY if needed &  limit to 5 days /week to avoid addiction 30 tablet 0  .  amphetamine-dextroamphetamine (ADDERALL) 15 MG tablet Take 1/2 to 1 tablet 1 to 2 x /day ONLY if needed for focus & Concentration. LImit to work days 60 tablet 0  . azelastine (ASTELIN) 0.1 % nasal spray Place 2 sprays into both nostrils 2 (two) times daily. Use in each nostril as directed (Patient taking differently: Place 2 sprays as needed into both nostrils. Use in each nostril as directed) 30 mL 2  . azithromycin (ZITHROMAX) 250 MG tablet Take 2 tablets PO on Day 1, then 1 tablet PO QDaily for 4 days. 6 tablet 1  . Calcium-Magnesium 500-250 MG TABS Take by mouth daily.     . cetirizine (ZYRTEC) 10 MG tablet Take 10 mg by mouth daily.    . Cholecalciferol (VITAMIN D PO) Take 5,000 Units by mouth.    . conjugated estrogens (PREMARIN) vaginal cream Place 1 Applicatorful vaginally daily. 42.5 g 12  . cyclobenzaprine (  FLEXERIL) 10 MG tablet Take 1/2 to 1 tab 2 to 3 x /day Only if needed for Muscle Spasm 90 tablet 1  . desvenlafaxine (PRISTIQ) 50 MG 24 hr tablet Take 1 tablet Daily for Mood 90 tablet 1  . diclofenac (FLECTOR) 1.3 % PTCH Place 1 patch onto the skin 2 (two) times daily. 30 patch 2  . estradiol (ESTRACE) 2 MG tablet Take 2 mg daily by mouth.    . furosemide (LASIX) 20 MG tablet Take 1 tablet (20 mg total) by mouth daily as needed for edema. 30 tablet 11  . hydroxychloroquine (PLAQUENIL) 200 MG tablet Take 2 tablets 2 x on 1st day,  then take 1 tablet 2 x /day 180 tablet 1  . meloxicam (MOBIC) 15 MG tablet Take 1 tablet (15 mg total) by mouth daily. 30 tablet 3  . ondansetron (ZOFRAN) 4 MG tablet Take 1 tablet (4 mg total) by mouth daily as needed for nausea or vomiting (Not sedating). 30 tablet 1  . predniSONE (DELTASONE) 20 MG tablet Take 1 tab 3 x/day - 3 days, then 2 x /day - 3 days, then 1 x /day 20 tablet 1  . progesterone (PROMETRIUM) 100 MG capsule Take 1 capsule daily for 10 days a month. (Patient taking differently: Take 100 mg daily by mouth. Take 1 capsule daily for 10 days a  month.) 30 capsule 2  . rOPINIRole (REQUIP) 1 MG tablet TAKE 1 TO 2 TABLETS BY MOUTH AT BEDTIME FOR LEGS 180 tablet 1  . triamcinolone cream (KENALOG) 0.1 % Apply 1 application topically 2 (two) times daily.    Marland Kitchen zolpidem (AMBIEN) 10 MG tablet TK 1 T PO QD HS  5   No current facility-administered medications on file prior to visit.    Allergies:  Allergies  Allergen Reactions  . Percocet [Oxycodone-Acetaminophen]    Medical History:  She has Insomnia; Vitamin D deficiency; ADD (attention deficit disorder); Migraines; Allergy; Chronic lumbar pain; Nocturnal leg cramps; Moderate episode of recurrent major depressive disorder ([place]); Overweight (BMI 25.0-29.9); Plantar fasciitis of right foot; and Osteopenia on their problem list. Health Maintenance:   Immunization History  Administered Date(s) Administered  . Influenza Inj Mdck Quad With Preservative 05/16/2018, 05/22/2019  . Influenza Split 05/09/2014  . Influenza-Unspecified 05/02/2013  . Pneumococcal-Unspecified 08/15/1997  . Td 08/15/2006  . Tdap 09/15/2012   Tetanus: 2014 Flu vaccine: 2015 Shingrix: Postpone, unavailable in office ***  LMP: Patient has had an ablation.  Pap: 2019 norm, Dr. Juan Quam office MGM: 04/2019 R diagnostic, benign, last bilateral *** Colonoscopy: 2014, 10 year recommend EGD: n/a  Last Dental Exam: 05/2018 Dr. Christie Nottingham  Last Eye Exam: 2017 Dr. Delight Stare Skin exam: 2019, Dr. Ubaldo Glassing, no concerns  Patient Care Team: Unk Pinto, MD as PCP - General (Internal Medicine) Liane Comber, NP as Nurse Practitioner (Nurse Practitioner) Vicie Mutters, PA-C (Physician Assistant)  Surgical History:  She has a past surgical history that includes Gynecologic cryosurgery; Novasure ablation; Oophorectomy (Right); Breast enhancement surgery (Bilateral, 1991); Parotidectomy (Left, 09/2008); Breast biopsy (Left, 2018); Tooth extraction (Right, 05/24/2018); and Augmentation mammaplasty (Bilateral). Family  History:  Herfamily history includes Breast cancer in her maternal aunt, maternal grandmother, and paternal aunt; Cancer in her father; Depression in her mother; Diabetes in her brother; Heart disease in her father, maternal grandfather, and paternal grandfather; Hypertension in her father; Stroke in her paternal grandfather; Suicidality in her paternal grandmother; Tongue cancer in her father. Social History:  She reports that she has never smoked.  She has never used smokeless tobacco. She reports current alcohol use. She reports that she does not use drugs.  Review of Systems: Review of Systems  Constitutional: Negative for chills, diaphoresis, fever, malaise/fatigue and weight loss.  HENT: Negative for ear pain, hearing loss and tinnitus.   Eyes: Negative for blurred vision, double vision, photophobia and pain.  Respiratory: Negative for cough, sputum production, shortness of breath and wheezing.   Cardiovascular: Negative for chest pain, palpitations, orthopnea, claudication and leg swelling.  Gastrointestinal: Negative for abdominal pain, blood in stool, constipation, diarrhea, heartburn, melena, nausea and vomiting.  Genitourinary: Negative.  Negative for dysuria and urgency.  Musculoskeletal: Negative for joint pain, myalgias and neck pain.  Skin: Negative for rash.  Neurological: Negative for dizziness, tingling, sensory change, weakness and headaches.  Endo/Heme/Allergies: Positive for environmental allergies. Negative for polydipsia.  Psychiatric/Behavioral: Positive for depression. Negative for memory loss, substance abuse and suicidal ideas. The patient has insomnia. The patient is not nervous/anxious.   All other systems reviewed and are negative.   Physical Exam: Estimated body mass index is 28.78 kg/m as calculated from the following:   Height as of 06/21/18: 5' 4.25" (1.632 m).   Weight as of 06/21/18: 169 lb (76.7 kg). There were no vitals taken for this visit. General  Appearance: Well nourished, in no apparent distress.  Eyes: PERRLA, EOMs, conjunctiva no swelling or erythema, normal fundi and vessels.  Sinuses: No Frontal/maxillary tenderness  ENT/Mouth: Ext aud canals clear, normal light reflex with TMs without erythema, bulging. Good dentition. No erythema, swelling, or exudate on post pharynx. Tonsils not swollen or erythematous. Hearing normal.  Neck: Supple, thyroid normal. No bruits  Respiratory: Respiratory effort normal, BS equal bilaterally without rales, rhonchi, wheezing or stridor.  Cardio: RRR without murmurs, rubs or gallops. Somewhat diminished distal pulses, 1+ bilaterally without edema.  Chest: symmetric, with normal excursions and percussion.  Breasts: Patient requests to defer, sees GYN Abdomen: Soft, BS x 4, no tenderness, no guarding, rebound, hernias, masses, or organomegaly.  Lymphatics: Non tender without lymphadenopathy.  Genitourinary: Defer to GYN Musculoskeletal: Full ROM all peripheral extremities,5/5 strength, and normal gait. Tenderness to R anterior/medial heel Skin: Warm, dry without rashes, lesions, ecchymosis. Neuro: Cranial nerves intact, reflexes diminished throughout though equal bilaterally. Normal muscle tone, no cerebellar symptoms. Sensation intact.  Psych: Awake and oriented X 3, normal affect, Insight and Judgment appropriate.   EKG: WNL no ST changes, RSR' V1 V2 ***  Gorden Harms Neil Brickell 1:14 PM [facility]

## 2019-06-26 ENCOUNTER — Encounter: Payer: Self-pay | Admitting: Adult Health

## 2019-07-03 NOTE — Progress Notes (Deleted)
Complete Physical  Assessment and Plan:  Carla Fuller was seen today for annual exam.  Diagnoses and all orders for this visit:  Encounter for routine adult physical exam with abnormal findings  Hyperlipidemia Mild, treated by lifestyle interventions Continue low cholesterol diet and exercise.  Check lipid panel.  -     Lipid panel  Attention deficit disorder (ADD) without hyperactivity Continue medications Helps with focus, no AE's. The patient was counseled on the addictive nature of the medication and was encouraged to take drug holidays when not needed.   Chronic low back pain, unspecified back pain laterality, with sciatica presence unspecified       -     Chronic tailbone pain; managed by avoidance of pressure and medications as needed. Sees ortho.   Migraine without aura and without status migrainosus, not intractable Recently improved post tooth extration, well managed by OTC analgesics PRN -     Magnesium  Moderate episode of recurrent major depressive disorder (HCC) -     Continue pristiq -     Also discussed mindfulness, medication, relaxation techniques and recommended cognitive behavioral therapy  Insomnia, unspecified type       -      Moderately well managed by current medications  Overweight Long discussion about weight loss, diet, and exercise Recommended diet heavy in fruits and veggies and low in animal meats, cheeses, and dairy products, appropriate calorie intake Patient will work on exercise, particularly weight lifting and resistance exercise Follow up at next visit  Vitamin D deficiency -     VITAMIN D 25 Hydroxy (Vit-D Deficiency, Fractures)  Allergic state, sequela      -      Well managed by current medications  Nocturnal leg cramps ABI was normal, CBC, iron has been normal  Much improved with requip 2 mg in the evening  Right plantar fascitis Has failed conservative interventions and injections x 2 in office, will refer to podiatry per  patient request to Dr. [name]  Medication management -     CBC with Differential/Platelet -     CMP/GFR  Screening for cardiovascular condition -     Lipid panel -     EKG 12-Lead  Screening for hematuria or proteinuria -     Urinalysis, Complete (81001)  Screening for thyroid disorder -     TSH  Screening for diabetes mellitus -     Hemoglobin A1c  Chronic kidney disease (CKD), stage II (mild) -     COMPLETE METABOLIC PANEL WITH GFR   Discussed med's effects and SE's. Screening labs and tests as requested with regular follow-up as recommended. Over 40 minutes of exam, counseling, chart review, and complex, high level critical decision making was performed this visit.   Future Appointments  Date Time Provider [place]  07/04/2019  3:00 PM Liane Comber, NP [facility] None    HPI  53 y.o. married Caucasian female  presents for a complete physical and follow up for has Insomnia; Vitamin D deficiency; ADD (attention deficit disorder); Migraines; Allergy; Chronic lumbar pain; Nocturnal leg cramps; Moderate episode of recurrent major depressive disorder ([name]); Overweight (BMI 25.0-29.9); Plantar fasciitis of right foot; Osteopenia; and Mixed hyperlipidemia on their problem list..   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   Follows with GYN ***, on HRT ***  *** plaquenil   Patient is on an ADD medication, adderall 15 mg BID on work days, tried vyvanse but was too expensive, she states that  the current medication is helping and she denies any adverse reactions. She is doing well with current regiment.   She has depression/anxiety, especially centered around her weight.   She has dx of moderate depression and has been on pristiq x 18 months and ***.  She was switched to pristiq at last year, has failed lexapro, zoloft, wellbutrin.   She has ongoing decreased sex drive, she is on estrogen, progesterone daily ***via GYN? , she was also been  doing premarin/tesosterone cream but experienced a localized reaction and stopped topical testosterone.   She is prescribed 5 mg ambien PRN insomnia ***  She reports she has been followed by ortho for cervical facet issues that seem to be contributing to "migraines" - possible tension headaches, triggered by stress- she reports her migraines were worked up extensively without attributable cause. She reported headaches have improved since after tooth extraction.   She has ongoing right plantar fascitis despite numerous steroid injections and was referred to podiatry Dr. [name]. ***  She has leg cramps at night, normal iron, on requip ***  BMI is There is no height or weight on file to calculate BMI., she has been working on diet; she is active with her grandkids but otherwise does not exercises intentionally. Weight has been an ongoing struggle.  Wt Readings from Last 3 Encounters:  06/21/18 169 lb (76.7 kg)  01/30/18 170 lb (77.1 kg)  10/17/17 165 lb (74.8 kg)   Today their BP is     She does workout. She denies chest pain, shortness of breath, dizziness.   She is not on cholesterol medication. Her cholesterol is not at goal. The cholesterol last visit was:   Lab Results  Component Value Date   CHOL 195 06/21/2018   HDL 51 06/21/2018   LDLCALC 123 (H) 06/21/2018   TRIG 107 06/21/2018   CHOLHDL 3.8 06/21/2018    Last A1C in the office was:  Lab Results  Component Value Date   HGBA1C 5.2 06/21/2018   Last GFR: Lab Results  Component Value Date   GFRNONAA 77 06/21/2018   Patient is on Vitamin D supplement and at goal:   Lab Results  Component Value Date   VD25OH 69 06/21/2018        Current Medications:  Current Outpatient Medications on File Prior to Visit  Medication Sig Dispense Refill  . ALPRAZolam (XANAX) 0.5 MG tablet Take 1/2-1 tablet at hour of Sleep ONLY if needed &  limit to 5 days /week to avoid addiction 30 tablet 0  . amphetamine-dextroamphetamine (ADDERALL)  15 MG tablet Take 1/2 to 1 tablet 1 to 2 x /day ONLY if needed for focus & Concentration. LImit to work days 60 tablet 0  . azelastine (ASTELIN) 0.1 % nasal spray Place 2 sprays into both nostrils 2 (two) times daily. Use in each nostril as directed (Patient taking differently: Place 2 sprays as needed into both nostrils. Use in each nostril as directed) 30 mL 2  . azithromycin (ZITHROMAX) 250 MG tablet Take 2 tablets PO on Day 1, then 1 tablet PO QDaily for 4 days. 6 tablet 1  . Calcium-Magnesium 500-250 MG TABS Take by mouth daily.     . cetirizine (ZYRTEC) 10 MG tablet Take 10 mg by mouth daily.    . Cholecalciferol (VITAMIN D PO) Take 5,000 Units by mouth.    . conjugated estrogens (PREMARIN) vaginal cream Place 1 Applicatorful vaginally daily. 42.5 g 12  . cyclobenzaprine (FLEXERIL) 10 MG tablet Take 1/2  to 1 tab 2 to 3 x /day Only if needed for Muscle Spasm 90 tablet 1  . desvenlafaxine (PRISTIQ) 50 MG 24 hr tablet Take 1 tablet Daily for Mood 90 tablet 1  . diclofenac (FLECTOR) 1.3 % PTCH Place 1 patch onto the skin 2 (two) times daily. 30 patch 2  . estradiol (ESTRACE) 2 MG tablet Take 2 mg daily by mouth.    . furosemide (LASIX) 20 MG tablet Take 1 tablet (20 mg total) by mouth daily as needed for edema. 30 tablet 11  . hydroxychloroquine (PLAQUENIL) 200 MG tablet Take 2 tablets 2 x on 1st day,  then take 1 tablet 2 x /day 180 tablet 1  . meloxicam (MOBIC) 15 MG tablet Take 1 tablet (15 mg total) by mouth daily. 30 tablet 3  . ondansetron (ZOFRAN) 4 MG tablet Take 1 tablet (4 mg total) by mouth daily as needed for nausea or vomiting (Not sedating). 30 tablet 1  . predniSONE (DELTASONE) 20 MG tablet Take 1 tab 3 x/day - 3 days, then 2 x /day - 3 days, then 1 x /day 20 tablet 1  . progesterone (PROMETRIUM) 100 MG capsule Take 1 capsule daily for 10 days a month. (Patient taking differently: Take 100 mg daily by mouth. Take 1 capsule daily for 10 days a month.) 30 capsule 2  . rOPINIRole  (REQUIP) 1 MG tablet TAKE 1 TO 2 TABLETS BY MOUTH AT BEDTIME FOR LEGS 180 tablet 1  . triamcinolone cream (KENALOG) 0.1 % Apply 1 application topically 2 (two) times daily.    Marland Kitchen zolpidem (AMBIEN) 10 MG tablet TK 1 T PO QD HS  5   No current facility-administered medications on file prior to visit.    Allergies:  Allergies  Allergen Reactions  . Percocet [Oxycodone-Acetaminophen]    Medical History:  She has Insomnia; Vitamin D deficiency; ADD (attention deficit disorder); Migraines; Allergy; Chronic lumbar pain; Nocturnal leg cramps; Moderate episode of recurrent major depressive disorder ([place]); Overweight (BMI 25.0-29.9); Plantar fasciitis of right foot; Osteopenia; and Mixed hyperlipidemia on their problem list. Health Maintenance:   Immunization History  Administered Date(s) Administered  . Influenza Inj Mdck Quad With Preservative 05/16/2018, 05/22/2019  . Influenza Split 05/09/2014  . Influenza-Unspecified 05/02/2013  . Pneumococcal-Unspecified 08/15/1997  . Td 08/15/2006  . Tdap 09/15/2012   Tetanus: 2014 Flu vaccine: 2015 Shingrix: Postpone, unavailable in office ***  LMP: Patient has had an ablation.  Pap: 2019 norm, Dr. Juan Quam office *** MGM: 04/2019 R diagnostic, benign, last bilateral *** Colonoscopy: 2014, 10 year recommend EGD: n/a  Last Dental Exam: 05/2018 Dr. Christie Nottingham  Last Eye Exam: 2017 Dr. Delight Stare Skin exam: 2019, Dr. Ubaldo Glassing, no concerns  Patient Care Team: Unk Pinto, MD as PCP - General (Internal Medicine) Liane Comber, NP as Nurse Practitioner (Nurse Practitioner) Vicie Mutters, PA-C (Physician Assistant)  Surgical History:  She has a past surgical history that includes Gynecologic cryosurgery; Novasure ablation; Oophorectomy (Right); Breast enhancement surgery (Bilateral, 1991); Parotidectomy (Left, 09/2008); Breast biopsy (Left, 2018); Tooth extraction (Right, 05/24/2018); and Augmentation mammaplasty (Bilateral). Family History:   Herfamily history includes Breast cancer in her maternal aunt, maternal grandmother, and paternal aunt; Cancer in her father; Depression in her mother; Diabetes in her brother; Heart disease in her father, maternal grandfather, and paternal grandfather; Hypertension in her father; Stroke in her paternal grandfather; Suicidality in her paternal grandmother; Tongue cancer in her father. Social History:  She reports that she has never smoked. She has never  used smokeless tobacco. She reports current alcohol use. She reports that she does not use drugs.  Review of Systems: Review of Systems  Constitutional: Negative for chills, diaphoresis, fever, malaise/fatigue and weight loss.  HENT: Negative for ear pain, hearing loss and tinnitus.   Eyes: Negative for blurred vision, double vision, photophobia and pain.  Respiratory: Negative for cough, sputum production, shortness of breath and wheezing.   Cardiovascular: Negative for chest pain, palpitations, orthopnea, claudication and leg swelling.  Gastrointestinal: Negative for abdominal pain, blood in stool, constipation, diarrhea, heartburn, melena, nausea and vomiting.  Genitourinary: Negative.  Negative for dysuria and urgency.  Musculoskeletal: Negative for joint pain, myalgias and neck pain.  Skin: Negative for rash.  Neurological: Negative for dizziness, tingling, sensory change, weakness and headaches.  Endo/Heme/Allergies: Positive for environmental allergies. Negative for polydipsia.  Psychiatric/Behavioral: Positive for depression. Negative for memory loss, substance abuse and suicidal ideas. The patient has insomnia. The patient is not nervous/anxious.   All other systems reviewed and are negative.   Physical Exam: Estimated body mass index is 28.78 kg/m as calculated from the following:   Height as of 06/21/18: 5' 4.25" (1.632 m).   Weight as of 06/21/18: 169 lb (76.7 kg). There were no vitals taken for this visit. General Appearance:  Well nourished, in no apparent distress.  Eyes: PERRLA, EOMs, conjunctiva no swelling or erythema, normal fundi and vessels.  Sinuses: No Frontal/maxillary tenderness  ENT/Mouth: Ext aud canals clear, normal light reflex with TMs without erythema, bulging. Good dentition. No erythema, swelling, or exudate on post pharynx. Tonsils not swollen or erythematous. Hearing normal.  Neck: Supple, thyroid normal. No bruits  Respiratory: Respiratory effort normal, BS equal bilaterally without rales, rhonchi, wheezing or stridor.  Cardio: RRR without murmurs, rubs or gallops. Somewhat diminished distal pulses, 1+ bilaterally without edema.  Chest: symmetric, with normal excursions and percussion.  Breasts: Patient requests to defer, sees GYN Abdomen: Soft, BS x 4, no tenderness, no guarding, rebound, hernias, masses, or organomegaly.  Lymphatics: Non tender without lymphadenopathy.  Genitourinary: Defer to GYN Musculoskeletal: Full ROM all peripheral extremities,5/5 strength, and normal gait. Tenderness to R anterior/medial heel Skin: Warm, dry without rashes, lesions, ecchymosis. Neuro: Cranial nerves intact, reflexes diminished throughout though equal bilaterally. Normal muscle tone, no cerebellar symptoms. Sensation intact.  Psych: Awake and oriented X 3, normal affect, Insight and Judgment appropriate.   EKG: WNL no ST changes, RSR' V1 V2 ***  Gorden Harms Marcques Wrightsman 5:47 PM [facility]

## 2019-07-04 ENCOUNTER — Encounter: Payer: Self-pay | Admitting: Adult Health

## 2019-07-04 ENCOUNTER — Other Ambulatory Visit: Payer: Self-pay

## 2019-07-04 ENCOUNTER — Ambulatory Visit (INDEPENDENT_AMBULATORY_CARE_PROVIDER_SITE_OTHER): Payer: 59 | Admitting: Adult Health

## 2019-07-04 VITALS — BP 112/72 | HR 69 | Temp 97.3°F | Ht 64.25 in | Wt 175.0 lb

## 2019-07-04 DIAGNOSIS — G4762 Sleep related leg cramps: Secondary | ICD-10-CM

## 2019-07-04 DIAGNOSIS — E559 Vitamin D deficiency, unspecified: Secondary | ICD-10-CM

## 2019-07-04 DIAGNOSIS — F988 Other specified behavioral and emotional disorders with onset usually occurring in childhood and adolescence: Secondary | ICD-10-CM

## 2019-07-04 DIAGNOSIS — F3340 Major depressive disorder, recurrent, in remission, unspecified: Secondary | ICD-10-CM

## 2019-07-04 DIAGNOSIS — Z131 Encounter for screening for diabetes mellitus: Secondary | ICD-10-CM

## 2019-07-04 DIAGNOSIS — I10 Essential (primary) hypertension: Secondary | ICD-10-CM

## 2019-07-04 DIAGNOSIS — K5909 Other constipation: Secondary | ICD-10-CM

## 2019-07-04 DIAGNOSIS — Z136 Encounter for screening for cardiovascular disorders: Secondary | ICD-10-CM | POA: Diagnosis not present

## 2019-07-04 DIAGNOSIS — Z0000 Encounter for general adult medical examination without abnormal findings: Secondary | ICD-10-CM | POA: Diagnosis not present

## 2019-07-04 DIAGNOSIS — L989 Disorder of the skin and subcutaneous tissue, unspecified: Secondary | ICD-10-CM

## 2019-07-04 DIAGNOSIS — G4700 Insomnia, unspecified: Secondary | ICD-10-CM

## 2019-07-04 DIAGNOSIS — T7840XS Allergy, unspecified, sequela: Secondary | ICD-10-CM

## 2019-07-04 DIAGNOSIS — E782 Mixed hyperlipidemia: Secondary | ICD-10-CM

## 2019-07-04 DIAGNOSIS — Z0001 Encounter for general adult medical examination with abnormal findings: Secondary | ICD-10-CM

## 2019-07-04 DIAGNOSIS — G43009 Migraine without aura, not intractable, without status migrainosus: Secondary | ICD-10-CM

## 2019-07-04 DIAGNOSIS — E663 Overweight: Secondary | ICD-10-CM

## 2019-07-04 DIAGNOSIS — Z1329 Encounter for screening for other suspected endocrine disorder: Secondary | ICD-10-CM

## 2019-07-04 DIAGNOSIS — M8580 Other specified disorders of bone density and structure, unspecified site: Secondary | ICD-10-CM

## 2019-07-04 MED ORDER — AMPHETAMINE-DEXTROAMPHETAMINE 15 MG PO TABS: ORAL_TABLET | ORAL | 0 refills | Status: DC

## 2019-07-04 NOTE — Patient Instructions (Addendum)
Carla Fuller , Thank you for taking time to come for your Annual Wellness Visit. I appreciate your ongoing commitment to your health goals. Please review the following plan we discussed and let me know if I can assist you in the future.   These are the goals we discussed: Goals    . Exercise 150 min/wk Moderate Activity     Incorporate weights/resistance exercises routinely       This is a list of the screening recommended for you and due dates:  Health Maintenance  Topic Date Due  . HIV Screening  06/29/1980  . Pap Smear  03/02/2018  . Mammogram  05/08/2021  . Tetanus Vaccine  09/15/2022  . Colon Cancer Screening  03/23/2023  . Flu Shot  Completed     Ask insurance if they cover shingrix - can get at [facility] or [facility] - 2 shot series 2-6 months apart  Miralax, dulcolax - softener - if stools are very hard/dry (always with LOTs of water) - 2-3 days a week to begin with (until soft but not "chocolate pudding")  Try senna product - senokot - OTC, gentle stimulant - daily - helps things move   You are probably getting plenty of fiber in your diet - try stopping fiber supplements for now and see if this helps reduce gassiness/bloating/belching. Alternately may be silent reflux, so if not improving consider getting on a daily reflux medication for 2 weeks to see if this helps.     Aim for 7+ servings of fruits and vegetables daily  65-80+ fluid ounces of water or unsweet tea for healthy kidneys  Limit to max 1 drink of alcohol per day; avoid smoking/tobacco  Limit animal fats in diet for cholesterol and heart health - choose grass fed whenever available  Avoid highly processed foods, and foods high in saturated/trans fats  Aim for low stress - take time to unwind and care for your mental health  Aim for 150 min of moderate intensity exercise weekly for heart health, and weights twice weekly for bone health  Aim for 7-9 hours of sleep daily       Chronic  Constipation  Chronic constipation is a condition in which a person has three or fewer bowel movements a week, for three months or longer. This condition is especially common in older adults. The two main kinds of chronic constipation are secondary constipation and functional constipation. Secondary constipation results from another condition or a treatment. Functional constipation, also called primary or idiopathic constipation, is divided into three types:  Normal transit constipation. In this type, movement of stool through the colon (stool transit) occurs normally.  Slow transit constipation. In this type, stool moves slowly through the colon.  Outlet constipation or pelvic floor dysfunction. In this type, the nerves and muscles that empty the rectum do not work normally. What are the causes? Causes of secondary constipation may include:  Failing to drink enough fluid, eat enough food or fiber, or get physically active.  Pregnancy.  A tear in the anus (anal fissure).  Blockage in the bowel (bowel obstruction).  Narrowing of the bowel (bowel stricture).  Having a long-term medical condition, such as: ? Diabetes. ? Hypothyroidism. ? Multiple sclerosis. ? Parkinson disease. ? Stroke. ? Spinal cord injury. ? Dementia. ? Colon cancer. ? Inflammatory bowel disease (IBD). ? Iron-deficiency anemia. ? Outward collapse of the rectum (rectal prolapse). ? Hemorrhoids.  Taking certain medicines, including: ? Narcotics. These are a certain type of prescription pain medicine. ?  Antacids. ? Iron supplements. ? Water pills (diuretics). ? Certain blood pressure medicines. ? Anti-seizure medicines. ? Antidepressants. ? Medicines for Parkinson disease. The cause of functional constipation is not known, but some conditions are associated with it. These conditions include:  Stress.  Problems in the nerves and muscles that control stool transit.  Weak or impaired pelvic floor  muscles. What increases the risk? You may be at higher risk for chronic constipation if you:  Are older than age 24.  Are female.  Live in a long-term care facility.  Do not get much exercise or physical activity (have a sedentary lifestyle).  Do not drink enough fluids.  Do not eat enough food, especially fiber.  Have a long-term disease.  Have a mental health disorder or eating disorder.  Take many medicines. What are the signs or symptoms? The main symptom of chronic constipation is having three or fewer bowel movements a week for several weeks. Other signs and symptoms may vary from person to person. These include:  Pushing hard (straining) to pass stool.  Painful bowel movements.  Having hard or lumpy stools.  Having lower belly discomfort, such as cramps or bloating.  Being unable to have a bowel movement when you feel the urge.  Feeling like you still need to pass stool after a bowel movement.  Feeling that you have something in your rectum that is blocking or preventing bowel movements.  Seeing blood on the toilet paper or in your stool.  Worsening confusion (in older adults). How is this diagnosed? This condition may be diagnosed based on:  Symptoms and medical history. You will be asked about your symptoms, lifestyle, diet, and any medicines that you are taking.  Physical exam. ? Your belly (abdomen) will be examined. ? A digital rectal exam may be done. For this exam, a health care provider places a lubricated, gloved finger into the rectum.  Other tests to check for any underlying causes of your constipation. These may be ordered if you have bleeding in your rectum, weight loss, or a family history of colon cancer. In these cases, you may have: ? Imaging studies of the colon. These may include X-ray, ultrasound, or CT scan. ? Blood tests. ? A procedure to examine the inside of your colon (colonoscopy). ? More specialized tests to check:  Whether  your anal sphincter works well. This is a ring-shaped muscle that controls the closing of the anus.  How well food moves through your colon. ? Tests to measure the nerve signal in your pelvic floor muscles (electromyography). How is this treated? Treatment for chronic constipation depends on the cause. Most often, treatment starts with:  Being more active and getting regular exercise.  Drinking more fluids.  Adding fiber to your diet. Sources of fiber include fruits, vegetables, whole grains, and fiber supplements.  Using medicines such as stool softeners or medicines that increase contractions in your digestive system (pro-motility agents).  Training your pelvic muscles with biofeedback.  Surgery, if there is obstruction. Treatment for secondary chronic constipation depends on the underlying condition. You may need to:  Stop or change some medicines if they cause constipation.  Use a fiber supplement (bulk laxative) or stool softener.  Use prescription laxative. This works by [employer] into your colon (osmotic laxative). You may also need to see a specialist who treats conditions of the digestive system (gastroenterologist). Follow these instructions at home:   Take over-the-counter and prescription medicines only as told by your health care provider.  If you are taking a laxative, take it as told by your health care provider.  Eat a balanced diet that includes enough fiber. Ask your health care provider to recommend a diet that is right for you.  Drink clear fluids, especially water. Avoid drinking alcohol, caffeine, and soda.  Drink enough fluid to keep your urine pale yellow.  Get some physical activity every day. Ask your health care provider what physical activities are safe for you.  Get colon cancer screenings as told by your health care provider.  Keep all follow-up visits as told by your health care provider. This is important. Contact a health care  provider if:  You are having three or fewer bowel movements a week.  Your stools are hard or lumpy.  You notice blood on the toilet paper or in your stool after you have a bowel movement.  You have unexplained weight loss.  You have rectum (rectal) pain.  You have stool leakage.  You experience nausea or vomiting. Get help right away if:  You have rectal bleeding or you pass blood clots.  You have severe rectal pain.  You have body tissue that pushes out (protrudes) from your anus.  You have severe pain or bloating (distension) in your abdomen.  You have vomiting that you cannot control. Summary  Chronic constipation is a condition in which a person has three or fewer bowel movements a week, for three months or longer.  You may have a higher risk for this condition if you are an older adult, or if you do not drink enough water or get enough physical activity (are sedentary).  Treatment for this condition depends on the cause. Most treatments for chronic constipation include adding fiber to your diet, drinking more fluids, and getting more physical activity. You may also need to treat any underlying medical conditions or stop or change certain medicines if they cause constipation.  If lifestyle changes do not relieve constipation, your health care provider may recommend taking a laxative. This information is not intended to replace advice given to you by your health care provider. Make sure you discuss any questions you have with your health care provider. Document Released: 02/28/2017 Document Revised: 07/14/2017 Document Reviewed: 04/18/2017 Elsevier Patient Education  2020 [employer].

## 2019-07-04 NOTE — Progress Notes (Signed)
Complete Physical  Assessment and Plan:  Carla Fuller was seen today for annual exam.  Diagnoses and all orders for this visit:  Encounter for routine adult physical exam with abnormal findings Follows with GYN Dr. Nori Riis   Hyperlipidemia Mild, treated by lifestyle interventions Continue low cholesterol diet and exercise.  Check lipid panel.  -     Lipid panel  Attention deficit disorder (ADD) without hyperactivity Continue medications PRN Helps with focus, no AE's. The patient was counseled on the addictive nature of the medication and was encouraged to take drug holidays when not needed.   Chronic low back pain, unspecified back pain laterality, with sciatica presence unspecified       -     Chronic tailbone pain; managed by avoidance of pressure and medications as needed. Sees ortho.   Migraine without aura and without status migrainosus, not intractable Recently improved post tooth extration, well managed by OTC analgesics PRN -     Magnesium  Moderate episode of recurrent major depressive disorder in remission [facility]) She reports off of medications and doing much better; remission; monitor; failed numerous in the past, most recently on pristiq which worked well  Lifestyle discussed: diet/exerise, sleep hygiene, stress management, hydration  Insomnia, unspecified type       -      Moderately well managed by current medications, ambien via Dr. Nori Riis and melatonin  Overweight Long discussion about weight loss, diet, and exercise Recommended diet heavy in fruits and veggies and low in animal meats, cheeses, and dairy products, appropriate calorie intake Patient will work on exercise, particularly weight lifting and resistance exercise Follow up at next visit  Vitamin D deficiency -     VITAMIN D 25 Hydroxy (Vit-D Deficiency, Fractures)  Allergic state, sequela      -      Well managed by current medications  Nocturnal leg cramps ABI was normal, CBC, iron has been normal  Off  of requip for now per her preference; improved with walking  Medication management -     CBC with Differential/Platelet -     CMP/GFR  Screening for cardiovascular condition -     Lipid panel -     EKG 12-Lead  Screening for hematuria or proteinuria -     Urinalysis, Complete (81001)  Screening for thyroid disorder -     TSH  Screening for diabetes mellitus -     Hemoglobin A1c  Chronic kidney disease (CKD), stage II (mild) -     COMPLETE METABOLIC PANEL WITH GFR  Skin lesion of back Pearly borders, non-healing, ? Basal cell, she states will schedule follow up with Dr. Ubaldo Glassing  Chronic constipation Continue high fiber diet, plenty of water; increase walking Try miralax 2-3 days/week, add daily senokot for suspected slow transit etiology  Follow up if not improving to BM at least every other day   Discussed med's effects and SE's. Screening labs and tests as requested with regular follow-up as recommended. Over 40 minutes of exam, counseling, chart review, and complex, high level critical decision making was performed this visit.   No future appointments.  HPI  53 y.o. married Caucasian female  presents for a complete physical and follow up for has Insomnia; Vitamin D deficiency; ADD (attention deficit disorder); Migraines; Allergy; Chronic lumbar pain; Nocturnal leg cramps; Moderate episode of recurrent major depressive disorder ([place]); Overweight (BMI 25.0-29.9); Plantar fasciitis of right foot; Osteopenia; and Mixed hyperlipidemia on their problem list..   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   Follows with GYN - Dr. Nori Riis - for PAPs and mmg.   She reports some ongoing constipation for many years, has seen GI, no dx but mentioned "lazy colon" and was recommended daily soluble fiber. Has tried dulcolax which softened but didn't help move more frequently.   Patient is on an ADD medication, adderall 15 mg BID on  work days, tried vyvanse but was too expensive, she states that the current medication is helping and she denies any adverse reactions. She is doing well with current regiment.   She has dx of moderate depression, has failed many agents, lexapro, zoloft, wellbutrin, did well with pristiq for 18 months but stopped and reprots is actually doing fairly with just her night time sleeping medication (ambien 5 mg via Dr. Nori Riis).   She reports she has been followed by ortho for cervical facet issues that seem to be contributing to "migraines" - possible tension headaches, triggered by stress- she reports her migraines were worked up extensively without attributable cause. She reported headaches have improved since after tooth extraction.   She has ongoing right plantar fascitis despite numerous steroid injections and was referred to podiatry Dr. [name] and resolved.   BMI is Body mass index is 29.81 kg/m., she has been working on diet; she is active with her grandkids but otherwise does not exercises intentionally. Weight has been an ongoing struggle.  Wt Readings from Last 3 Encounters:  07/04/19 175 lb (79.4 kg)  06/21/18 169 lb (76.7 kg)  01/30/18 170 lb (77.1 kg)   Today their BP is BP: 112/72   She does workout. She denies chest pain, shortness of breath, dizziness.   She is not on cholesterol medication. Her cholesterol is not at goal. The cholesterol last visit was:   Lab Results  Component Value Date   CHOL 195 06/21/2018   HDL 51 06/21/2018   LDLCALC 123 (H) 06/21/2018   TRIG 107 06/21/2018   CHOLHDL 3.8 06/21/2018    Last A1C in the office was:  Lab Results  Component Value Date   HGBA1C 5.2 06/21/2018   Last GFR: Lab Results  Component Value Date   GFRNONAA 77 06/21/2018   Patient is on Vitamin D supplement and at goal, taking 10000 IU:  Lab Results  Component Value Date   VD25OH 69 06/21/2018       Current Medications:  Current Outpatient Medications on File Prior to  Visit  Medication Sig Dispense Refill  . Ascorbic Acid (VITAMIN C) 1000 MG tablet Take 2,000 mg by mouth daily.    . ASPIRIN 81 PO Take by mouth.    . Cholecalciferol (VITAMIN D PO) Take 10,000 Units by mouth.     . FIBER ADULT GUMMIES PO Take by mouth 2 (two) times daily.    . Fiber POWD Take by mouth daily.    . Magnesium 400 MG TABS Take by mouth daily.    . Polyethylene Glycol 3350 (MIRALAX PO) Take by mouth daily.    Marland Kitchen pyridOXINE (B-6) 50 MG tablet Take 50 mg by mouth daily.    Marland Kitchen zolpidem (AMBIEN) 10 MG tablet TK 1 T PO QD HS  5   No current facility-administered medications on file prior to visit.    Allergies:  Allergies  Allergen Reactions  . Percocet [Oxycodone-Acetaminophen]    Medical History:  She has Insomnia; Vitamin D deficiency; ADD (attention deficit disorder); Migraines; Allergy; Chronic lumbar pain; Nocturnal  leg cramps; Moderate episode of recurrent major depressive disorder ([place]); Overweight (BMI 25.0-29.9); Plantar fasciitis of right foot; Osteopenia; and Mixed hyperlipidemia on their problem list. Health Maintenance:   Immunization History  Administered Date(s) Administered  . Influenza Inj Mdck Quad With Preservative 05/16/2018, 05/22/2019  . Influenza Split 05/09/2014  . Influenza-Unspecified 05/02/2013  . Pneumococcal-Unspecified 08/15/1997  . Td 08/15/2006  . Tdap 09/15/2012   Tetanus: 2014 Flu vaccine: 05/2019 Shingrix: Postpone, unavailable in office - will check with insurance and get at [facility]  LMP: Patient has had an ablation.  Pap: 05/2019 norm, Dr. Juan Quam office MGM: 04/2019 R diagnostic, benign, last bilateral 04/2019 Colonoscopy: 2014, 10 year recommend EGD: n/a  Last Dental Exam: 04/2019, Dr. Christie Nottingham  Last Eye Exam: 2017 Dr. Delight Stare, reading glasses, will schedule  Skin exam: 2019, Dr. Ubaldo Glassing, no concerns  Patient Care Team: Unk Pinto, MD as PCP - General (Internal Medicine) Liane Comber, NP as Nurse Practitioner (Nurse  Practitioner) Vicie Mutters, PA-C (Physician Assistant)  Surgical History:  She has a past surgical history that includes Gynecologic cryosurgery; Novasure ablation; Oophorectomy (Right); Breast enhancement surgery (Bilateral, 1991); Parotidectomy (Left, 09/2008); Breast biopsy (Left, 2018); Tooth extraction (Right, 05/24/2018); and Augmentation mammaplasty (Bilateral). Family History:  Herfamily history includes Breast cancer in her maternal aunt, maternal grandmother, and paternal aunt; Cancer in her father; Depression in her mother; Diabetes in her brother; Heart disease in her father, maternal grandfather, and paternal grandfather; Hypertension in her father; Stroke in her paternal grandfather; Suicidality in her paternal grandmother; Tongue cancer in her father. Social History:  She reports that she has never smoked. She has never used smokeless tobacco. She reports current alcohol use. She reports that she does not use drugs.  Review of Systems: Review of Systems  Constitutional: Negative for chills, diaphoresis, fever, malaise/fatigue and weight loss.  HENT: Negative for ear pain, hearing loss and tinnitus.   Eyes: Negative for blurred vision, double vision, photophobia and pain.  Respiratory: Negative for cough, sputum production, shortness of breath and wheezing.   Cardiovascular: Negative for chest pain, palpitations, orthopnea, claudication and leg swelling.  Gastrointestinal: Positive for constipation. Negative for abdominal pain, blood in stool, diarrhea, heartburn, melena, nausea and vomiting.  Genitourinary: Negative.  Negative for dysuria and urgency.  Musculoskeletal: Negative for joint pain, myalgias and neck pain.  Skin: Negative for rash.  Neurological: Negative for dizziness, tingling, sensory change, weakness and headaches.  Endo/Heme/Allergies: Positive for environmental allergies. Negative for polydipsia.  Psychiatric/Behavioral: Positive for depression. Negative for  memory loss, substance abuse and suicidal ideas. The patient has insomnia. The patient is not nervous/anxious.   All other systems reviewed and are negative.   Physical Exam: Estimated body mass index is 29.81 kg/m as calculated from the following:   Height as of this encounter: 5' 4.25" (1.632 m).   Weight as of this encounter: 175 lb (79.4 kg). BP 112/72   Pulse 69   Temp (!) 97.3 F (36.3 C)   Ht 5' 4.25" (1.632 m)   Wt 175 lb (79.4 kg)   SpO2 99%   BMI 29.81 kg/m  General Appearance: Well nourished, in no apparent distress.  Eyes: PERRLA, EOMs, conjunctiva no swelling or erythema Sinuses: No Frontal/maxillary tenderness  ENT/Mouth: Ext aud canals clear, normal light reflex with TMs without erythema, bulging. Good dentition. No erythema, swelling, or exudate on post pharynx. Tonsils not swollen or erythematous. Hearing normal.  Neck: Supple, thyroid normal. No bruits  Respiratory: Respiratory effort normal, BS equal bilaterally  without rales, rhonchi, wheezing or stridor.  Cardio: RRR without murmurs, rubs or gallops. Somewhat diminished distal pulses, 1+ bilaterally without edema.  Chest: symmetric, with normal excursions and percussion.  Breasts: Patient requests to defer, sees GYN Abdomen: Soft, BS x 4, no tenderness, no guarding, rebound, hernias, masses, or organomegaly.  Lymphatics: Non tender without lymphadenopathy.  Genitourinary: Defer to GYN Musculoskeletal: Full ROM all peripheral extremities,5/5 strength, and normal gait. Tenderness to R anterior/medial heel Skin: Warm, dry without rashes, ecchymosis. She has ? Ulcerated area to left upper back x 4 mm area, pearly borders.  Neuro: Cranial nerves intact, reflexes diminished throughout though equal bilaterally. Normal muscle tone, no cerebellar symptoms. Sensation intact.  Psych: Awake and oriented X 3, normal affect, Insight and Judgment appropriate.   EKG: WNL no ST changes  Gorden Harms Walter Grima 12:17 PM [facility]  Adult & Adolescent Internal Medicine

## 2019-07-05 LAB — COMPLETE METABOLIC PANEL WITH GFR
AG Ratio: 2 (calc) (ref 1.0–2.5)
ALT: 19 U/L (ref 6–29)
AST: 16 U/L (ref 10–35)
Albumin: 4.5 g/dL (ref 3.6–5.1)
Alkaline phosphatase (APISO): 84 U/L (ref 37–153)
BUN: 11 mg/dL (ref 7–25)
CO2: 30 mmol/L (ref 20–32)
Calcium: 9.6 mg/dL (ref 8.6–10.4)
Chloride: 102 mmol/L (ref 98–110)
Creat: 0.91 mg/dL (ref 0.50–1.05)
GFR, Est African American: 83 mL/min/{1.73_m2} (ref >=60)
GFR, Est Non African American: 72 mL/min/{1.73_m2} (ref >=60)
Globulin: 2.3 g/dL (calc) (ref 1.9–3.7)
Glucose, Bld: 90 mg/dL (ref 65–99)
Potassium: 4 mmol/L (ref 3.5–5.3)
Sodium: 139 mmol/L (ref 135–146)
Total Bilirubin: 0.4 mg/dL (ref 0.2–1.2)
Total Protein: 6.8 g/dL (ref 6.1–8.1)

## 2019-07-05 LAB — URINALYSIS, ROUTINE W REFLEX MICROSCOPIC
Bilirubin Urine: NEGATIVE
Glucose, UA: NEGATIVE
Hgb urine dipstick: NEGATIVE
Ketones, ur: NEGATIVE
Leukocytes,Ua: NEGATIVE
Nitrite: NEGATIVE
Protein, ur: NEGATIVE
Specific Gravity, Urine: 1.003 (ref 1.001–1.03)
pH: 7.5 (ref 5.0–8.0)

## 2019-07-05 LAB — CBC WITH DIFFERENTIAL/PLATELET
Absolute Monocytes: 510 cells/uL (ref 200–950)
Basophils Absolute: 41 cells/uL (ref 0–200)
Basophils Relative: 0.6 %
Eosinophils Absolute: 102 cells/uL (ref 15–500)
Eosinophils Relative: 1.5 %
HCT: 39.1 % (ref 35.0–45.0)
Hemoglobin: 12.9 g/dL (ref 11.7–15.5)
Lymphs Abs: 2434 cells/uL (ref 850–3900)
MCH: 29 pg (ref 27.0–33.0)
MCHC: 33 g/dL (ref 32.0–36.0)
MCV: 87.9 fL (ref 80.0–100.0)
MPV: 9.1 fL (ref 7.5–12.5)
Monocytes Relative: 7.5 %
Neutro Abs: 3713 cells/uL (ref 1500–7800)
Neutrophils Relative %: 54.6 %
Platelets: 433 10*3/uL — ABNORMAL HIGH (ref 140–400)
RBC: 4.45 10*6/uL (ref 3.80–5.10)
RDW: 12.3 % (ref 11.0–15.0)
Total Lymphocyte: 35.8 %
WBC: 6.8 10*3/uL (ref 3.8–10.8)

## 2019-07-05 LAB — LIPID PANEL
Cholesterol: 206 mg/dL — ABNORMAL HIGH (ref <200)
HDL: 55 mg/dL (ref >=50)
LDL Cholesterol (Calc): 129 mg/dL (calc) — ABNORMAL HIGH
Non-HDL Cholesterol (Calc): 151 mg/dL (calc) — ABNORMAL HIGH (ref <130)
Total CHOL/HDL Ratio: 3.7 (calc) (ref <5.0)
Triglycerides: 109 mg/dL (ref <150)

## 2019-07-05 LAB — HEMOGLOBIN A1C
Hgb A1c MFr Bld: 5.2 % of total Hgb (ref <5.7)
Mean Plasma Glucose: 103 (calc)
eAG (mmol/L): 5.7 (calc)

## 2019-07-05 LAB — MAGNESIUM: Magnesium: 2.2 mg/dL (ref 1.5–2.5)

## 2019-07-05 LAB — VITAMIN D 25 HYDROXY (VIT D DEFICIENCY, FRACTURES): Vit D, 25-Hydroxy: 98 ng/mL (ref 30–100)

## 2019-07-05 LAB — TSH: TSH: 1.72 mIU/L

## 2019-08-01 ENCOUNTER — Other Ambulatory Visit: Payer: 59

## 2019-08-05 ENCOUNTER — Other Ambulatory Visit: Payer: 59

## 2019-08-26 ENCOUNTER — Other Ambulatory Visit: Payer: Self-pay | Admitting: Internal Medicine

## 2019-08-26 MED ORDER — CYCLOBENZAPRINE HCL 10 MG PO TABS: ORAL_TABLET | ORAL | 0 refills | Status: DC

## 2019-10-14 ENCOUNTER — Other Ambulatory Visit: Payer: Self-pay

## 2019-10-14 ENCOUNTER — Other Ambulatory Visit: Payer: Self-pay | Admitting: Internal Medicine

## 2019-10-15 MED ORDER — AMPHETAMINE-DEXTROAMPHETAMINE 15 MG PO TABS: ORAL_TABLET | ORAL | 0 refills | Status: DC

## 2019-11-11 ENCOUNTER — Telehealth: Payer: Self-pay | Admitting: Physician Assistant

## 2019-11-11 DIAGNOSIS — K5900 Constipation, unspecified: Secondary | ICD-10-CM

## 2019-11-11 DIAGNOSIS — N941 Unspecified dyspareunia: Secondary | ICD-10-CM

## 2019-11-11 NOTE — Telephone Encounter (Signed)
Patient with history of constipation, buttocks pain that has been worked up with negative MRI, some painful intercourse, will refer to pelvic floor PT.

## 2020-01-10 ENCOUNTER — Emergency Department (HOSPITAL_BASED_OUTPATIENT_CLINIC_OR_DEPARTMENT_OTHER)
Admission: EM | Admit: 2020-01-10 | Discharge: 2020-01-11 | Disposition: A | Discharge status: Home / Self Care | Payer: 59 | Attending: Emergency Medicine | Admitting: Emergency Medicine

## 2020-01-10 ENCOUNTER — Emergency Department (HOSPITAL_BASED_OUTPATIENT_CLINIC_OR_DEPARTMENT_OTHER): Payer: 59

## 2020-01-10 ENCOUNTER — Other Ambulatory Visit: Payer: Self-pay | Admitting: Adult Health

## 2020-01-10 ENCOUNTER — Encounter (HOSPITAL_BASED_OUTPATIENT_CLINIC_OR_DEPARTMENT_OTHER): Payer: Self-pay

## 2020-01-10 ENCOUNTER — Other Ambulatory Visit: Payer: Self-pay

## 2020-01-10 DIAGNOSIS — W109XXA Fall (on) (from) unspecified stairs and steps, initial encounter: Secondary | ICD-10-CM | POA: Insufficient documentation

## 2020-01-10 DIAGNOSIS — S4991XA Unspecified injury of right shoulder and upper arm, initial encounter: Secondary | ICD-10-CM | POA: Diagnosis present

## 2020-01-10 DIAGNOSIS — S63277A Dislocation of unspecified interphalangeal joint of left little finger, initial encounter: Secondary | ICD-10-CM | POA: Diagnosis not present

## 2020-01-10 DIAGNOSIS — Y9301 Activity, walking, marching and hiking: Secondary | ICD-10-CM | POA: Diagnosis not present

## 2020-01-10 DIAGNOSIS — F909 Attention-deficit hyperactivity disorder, unspecified type: Secondary | ICD-10-CM | POA: Insufficient documentation

## 2020-01-10 DIAGNOSIS — S93401A Sprain of unspecified ligament of right ankle, initial encounter: Secondary | ICD-10-CM | POA: Insufficient documentation

## 2020-01-10 DIAGNOSIS — S7001XA Contusion of right hip, initial encounter: Secondary | ICD-10-CM | POA: Insufficient documentation

## 2020-01-10 DIAGNOSIS — Z79899 Other long term (current) drug therapy: Secondary | ICD-10-CM | POA: Insufficient documentation

## 2020-01-10 DIAGNOSIS — S4291XA Fracture of right shoulder girdle, part unspecified, initial encounter for closed fracture: Secondary | ICD-10-CM

## 2020-01-10 DIAGNOSIS — N182 Chronic kidney disease, stage 2 (mild): Secondary | ICD-10-CM | POA: Insufficient documentation

## 2020-01-10 DIAGNOSIS — Y929 Unspecified place or not applicable: Secondary | ICD-10-CM | POA: Insufficient documentation

## 2020-01-10 DIAGNOSIS — W19XXXA Unspecified fall, initial encounter: Secondary | ICD-10-CM

## 2020-01-10 DIAGNOSIS — Y999 Unspecified external cause status: Secondary | ICD-10-CM | POA: Insufficient documentation

## 2020-01-10 MED ORDER — ONDANSETRON HCL 4 MG/2ML IJ SOLN
4.0000 mg | Freq: Once | INTRAMUSCULAR | Status: AC
  Administered 2020-01-10: 4 mg via INTRAVENOUS
  Filled 2020-01-10: qty 2

## 2020-01-10 MED ORDER — AMPHETAMINE-DEXTROAMPHETAMINE 20 MG PO TABS: ORAL_TABLET | ORAL | 0 refills | Status: DC

## 2020-01-10 MED ORDER — PROPOFOL 10 MG/ML IV BOLUS
0.5000 mg/kg | Freq: Once | INTRAVENOUS | Status: AC
  Administered 2020-01-10: 38.6 mg via INTRAVENOUS
  Filled 2020-01-10: qty 20

## 2020-01-10 MED ORDER — HYDROCODONE-ACETAMINOPHEN 5-325 MG PO TABS: 1.0000 | ORAL_TABLET | Freq: Four times a day (QID) | ORAL | 0 refills | Status: DC | PRN

## 2020-01-10 MED ORDER — FENTANYL CITRATE (PF) 100 MCG/2ML IJ SOLN
50.0000 ug | INTRAMUSCULAR | Status: AC | PRN
  Administered 2020-01-10 (×2): 50 ug via INTRAVENOUS
  Filled 2020-01-10: qty 2

## 2020-01-10 MED ORDER — HYDROCODONE-ACETAMINOPHEN 5-325 MG PO TABS
1.0000 | ORAL_TABLET | Freq: Once | ORAL | Status: AC
  Administered 2020-01-10: 1 via ORAL
  Filled 2020-01-10: qty 1

## 2020-01-10 MED ORDER — FENTANYL CITRATE (PF) 100 MCG/2ML IJ SOLN
100.0000 ug | Freq: Once | INTRAMUSCULAR | Status: DC
  Filled 2020-01-10: qty 2

## 2020-01-10 NOTE — ED Provider Notes (Signed)
Carla Fuller   CSN: [id number] Arrival date & time: 01/10/20  2011     History Chief Complaint  Patient presents with  . Fall    Carla Fuller is a 54 y.o. female.  She is here for evaluation of injuries after a fall down a flight of stairs.  She was taking her dog out on the leash and fell down stairs approximately 7 foot off the ground.  Complaining of severe right shoulder pain.  Also has pain in left little finger, right hip right ankle.  Struck her head.  No loss of consciousness.  No nausea vomiting.  Received some pain medicine in triage with improvement in her symptoms.  No numbness or weakness.  No chest or abdominal pain.  The history is provided by the patient.  Fall This is a new problem. The current episode started 1 to 2 hours ago. The problem occurs constantly. The problem has not changed since onset.Pertinent negatives include no chest pain, no abdominal pain, no headaches and no shortness of breath. The symptoms are aggravated by bending and twisting. Nothing relieves the symptoms. She has tried nothing for the symptoms. The treatment provided no relief.       Past Medical History:  Diagnosis Date  . Abnormal cells of cervix    PAP  . ADD (attention deficit disorder)   . Allergy   . Anemia   . Anxiety   . 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 Chronic lumbar pain    Sacrum  . Insomnia   . Migraines   . Unspecified vitamin D deficiency     Patient Active Problem List   Diagnosis Date Noted  . Chronic constipation 07/04/2019  . Mixed hyperlipidemia 06/24/2019  . Osteopenia 08/17/2018  . Overweight (BMI 25.0-29.9) 06/20/2018  . Nocturnal leg cramps 06/20/2017  . Recurrent major depression in remission ([place]) 06/20/2017  . Insomnia   . Vitamin D deficiency   . ADD (attention deficit disorder)   . Migraines   . Allergy   . Chronic lumbar pain     Past Surgical History:  Procedure  Laterality Date  . AUGMENTATION MAMMAPLASTY Bilateral   . BREAST BIOPSY Left 2018  . BREAST ENHANCEMENT SURGERY Bilateral 1991   Saline implants  . GYNECOLOGIC CRYOSURGERY    . NOVASURE ABLATION    . OOPHORECTOMY Right   . PAROTIDECTOMY Left 09/2008   benign  . TOOTH EXTRACTION Right 05/24/2018   #31 extraction     OB History   No obstetric history on file.     Family History  Problem Relation Age of Onset  . Diabetes Brother   . Heart disease Father   . Tongue cancer Father        squamous cell carcinoma mets  . Hypertension Father   . Cancer Father        sacrum/lung mets  . Depression Mother   . Breast cancer Paternal Aunt   . Breast cancer Maternal Aunt   . Heart disease Paternal Grandfather   . Stroke Paternal Grandfather   . Heart disease Maternal Grandfather   . Breast cancer Maternal Grandmother   . Suicidality Paternal Grandmother     Social History   Tobacco Use  . Smoking status: Never Smoker  . Smokeless tobacco: Never Used  Substance Use Topics  . Alcohol use: Yes    Comment: rarely-wine  . Drug use: No    Home Medications Prior to Admission  medications   Medication Sig Start Date End Date Taking? Authorizing Provider  zolpidem (AMBIEN) 5 MG tablet Take 5 mg by mouth at bedtime as needed for sleep.   Yes [provider]  amphetamine-dextroamphetamine (ADDERALL) 20 MG tablet Take 1 tab daily as needed for focus, limit to 5 days a week or less to avoid addiction/tolerance. 01/10/20   Liane Comber, NP  Ascorbic Acid (VITAMIN C) 1000 MG tablet Take 2,000 mg by mouth daily.    [provider]  ASPIRIN 81 PO Take by mouth.    [provider]  Cholecalciferol (VITAMIN D PO) Take 10,000 Units by mouth.     [provider]  cyclobenzaprine (FLEXERIL) 10 MG tablet Take 1/2 to 1 tab 2 to 3 x /day Only if needed for Muscle Spasm 08/26/19   Unk Pinto, MD  FIBER ADULT GUMMIES PO Take by mouth 2 (two) times daily.     [provider]  Fiber POWD Take by mouth daily.    [provider]  Magnesium 400 MG TABS Take by mouth daily.    [provider]  Polyethylene Glycol 3350 (MIRALAX PO) Take by mouth daily.    [provider]  pyridOXINE (B-6) 50 MG tablet Take 50 mg by mouth daily.    [provider]    Allergies    Percocet [oxycodone-acetaminophen]  Review of Systems   Review of Systems  Constitutional: Negative for fever.  HENT: Negative for sore throat.   Eyes: Negative for visual disturbance.  Respiratory: Negative for shortness of breath.   Cardiovascular: Negative for chest pain.  Gastrointestinal: Negative for abdominal pain.  Genitourinary: Negative for dysuria.  Musculoskeletal: Negative for back pain and neck pain.  Skin: Negative for rash.  Neurological: Negative for headaches.    Physical Exam Updated Vital Signs BP 110/69 (BP Location: Left Arm)   Pulse 88   Temp 97.6 F (36.4 C) (Oral)   Resp 18   Ht 5' 4.5" (1.638 m)   Wt 77.1 kg   SpO2 94%   BMI 28.73 kg/m   Physical Exam Vitals and nursing Fuller reviewed.  Constitutional:      General: She is not in acute distress.    Appearance: She is well-developed.  HENT:     Head: Normocephalic and atraumatic.  Eyes:     Conjunctiva/sclera: Conjunctivae normal.  Cardiovascular:     Rate and Rhythm: Normal rate and regular rhythm.     Heart sounds: No murmur.  Pulmonary:     Effort: Pulmonary effort is normal. No respiratory distress.     Breath sounds: Normal breath sounds.  Abdominal:     Palpations: Abdomen is soft.     Tenderness: There is no abdominal tenderness.  Musculoskeletal:        General: Tenderness, deformity and signs of injury present.     Cervical back: Neck supple.     Comments: Upper extremity tenderness throughout right shoulder.  Does have some softening underneath the acromion concern for shoulder dislocation, elbow wrist hand nontender.  Radial pulse  and cap refill brisk.  Left upper extremity nontender shoulder elbow wrist.  Deformity of left fifth finger possible dislocation.  Tenderness range of motion of right hip although full range of motion.  Nontender knee.  Nontender ankle although some proximal foot tenderness.  Left lower extremity full range of motion without any pain or tenderness.  Skin:    General: Skin is warm and dry.     Capillary Refill:  Capillary refill takes less than 2 seconds.     Findings: Bruising present.  Neurological:     General: No focal deficit present.     Mental Status: She is alert and oriented to person, place, and time.     Sensory: No sensory deficit.     Motor: No weakness.     ED Results / Procedures / Treatments   Labs (all labs ordered are listed, but only abnormal results are displayed) Labs Reviewed - No data to display  EKG None  Radiology DG Shoulder 1 View Right  Result Date: 01/10/2020 CLINICAL DATA:  Post reduction radiographs EXAM: RIGHT SHOULDER - 1 VIEW COMPARISON:  X-ray from the same day. FINDINGS: Evaluation is limited by single view technique. There is improved alignment status post reduction of the glenohumeral dislocation. There appears to be a Hill-Sachs lesion. There may be a bony [place] with an indistinct inferior margin of the glenoid. There is a small bone island in the surgical neck of the humerus. IMPRESSION: 1. Evaluation limited by single view technique. 2. Interval improvement in osseous alignment status post reduction. 3. Probable Hill-Sachs lesion.  Questionable bony [place]. Electronically Signed   By: Constance Holster M.D.   On: 01/10/2020 23:29   DG Shoulder Right  Result Date: 01/10/2020 CLINICAL DATA:  Pain EXAM: RIGHT SHOULDER - 2+ VIEW COMPARISON:  None. FINDINGS: There is an anterior inferior glenohumeral dislocation. There is a probable Hill-Sachs lesion. The osseous mineralization is within normal limits. IMPRESSION: Anterior inferior glenohumeral  dislocation with probable Hill-Sachs lesion. Electronically Signed   By: Constance Holster M.D.   On: 01/10/2020 22:12   DG Ankle Complete Right  Result Date: 01/10/2020 CLINICAL DATA:  Pain EXAM: RIGHT ANKLE - COMPLETE 3+ VIEW COMPARISON:  None. FINDINGS: There is no evidence of fracture, dislocation, or joint effusion. There is no evidence of arthropathy or other focal bone abnormality. Soft tissues are unremarkable. There is a small plantar calcaneal spur. IMPRESSION: Negative. Electronically Signed   By: Constance Holster M.D.   On: 01/10/2020 22:15   DG Hand 2 View Left  Result Date: 01/10/2020 CLINICAL DATA:  Post reduction radiographs. EXAM: LEFT HAND - 2 VIEW COMPARISON:  X-ray earlier in the same day FINDINGS: There is improved osseous alignment status post reduction of the fifth digit dislocation. There is surrounding soft tissue swelling. There are small osseous fragments projecting at the level of the proximal interphalangeal joint best viewed on the lateral view. IMPRESSION: 1. Interval reduction of the previously demonstrated fifth digit dislocation. 2. There are small fracture fragments surrounding the proximal interphalangeal joint best visualized on the lateral view. Electronically Signed   By: Constance Holster M.D.   On: 01/10/2020 23:31   DG Hand Complete Left  Result Date: 01/10/2020 CLINICAL DATA:  Pain EXAM: LEFT HAND - COMPLETE 3+ VIEW COMPARISON:  None. FINDINGS: There is dislocation of the proximal interphalangeal joint of the fifth digit with surrounding soft tissue swelling. There is no definite acute displaced fracture, however evaluation is limited by overlapping osseous structures on the lateral view in addition to the patient's metallic ring at the level of the fourth digit. IMPRESSION: Dislocation of the proximal interphalangeal joint of the fifth digit with surrounding soft tissue swelling. Electronically Signed   By: Constance Holster M.D.   On: 01/10/2020 22:13    DG Foot Complete Right  Result Date: 01/10/2020 CLINICAL DATA:  Pain status post fall EXAM: RIGHT FOOT COMPLETE - 3+ VIEW COMPARISON:  None. FINDINGS: There  is no evidence of fracture or dislocation. There is no evidence of arthropathy or other focal bone abnormality. Soft tissues are unremarkable. There appears to be a small foreign body on the skin surface of the fifth digit. IMPRESSION: Negative. Electronically Signed   By: Constance Holster M.D.   On: 01/10/2020 22:11   DG Hip Unilat With Pelvis 2-3 Views Right  Result Date: 01/10/2020 CLINICAL DATA:  Pain status post fall EXAM: DG HIP (WITH OR WITHOUT PELVIS) 2-3V RIGHT COMPARISON:  None. FINDINGS: There is no evidence of hip fracture or dislocation. There is no evidence of arthropathy or other focal bone abnormality. IMPRESSION: Negative. Electronically Signed   By: Constance Holster M.D.   On: 01/10/2020 22:13    Procedures .Sedation  Date/Time: 01/10/2020 10:57 PM Performed by: Hayden Rasmussen, MD Authorized by: Hayden Rasmussen, MD   Consent:    Consent obtained:  Verbal and written   Consent given by:  Patient and spouse   Risks discussed:  Allergic reaction, dysrhythmia, inadequate sedation, nausea, prolonged hypoxia resulting in organ damage, prolonged sedation necessitating reversal, respiratory compromise necessitating ventilatory assistance and intubation and vomiting   Alternatives discussed:  Analgesia without sedation, anxiolysis and regional anesthesia Universal protocol:    Procedure explained and questions answered to patient or proxy's satisfaction: yes     Relevant documents present and verified: yes     Test results available and properly labeled: yes     Imaging studies available: yes     Required blood products, implants, devices, and special equipment available: yes     Site/side marked: yes     Immediately prior to procedure a time out was called: yes     Patient identity confirmation method:  Verbally  with patient and arm band Indications:    Procedure performed:  Dislocation reduction   Procedure necessitating sedation performed by:  Physician performing sedation Pre-sedation assessment:    Time since last food or drink:  4   ASA classification: class 1 - normal, healthy patient     Neck mobility: normal     Mouth opening:  3 or more finger widths   Thyromental distance:  4 finger widths   Mallampati score:  I - soft palate, uvula, fauces, pillars visible   Pre-sedation assessments completed and reviewed: airway patency, cardiovascular function, hydration status, mental status, nausea/vomiting, pain level, respiratory function and temperature     Pre-sedation assessment completed:  01/10/2020 10:00 PM Immediate pre-procedure details:    Reassessment: Patient reassessed immediately prior to procedure     Reviewed: vital signs, relevant labs/tests and NPO status     Verified: bag valve mask available, emergency equipment available, intubation equipment available, IV patency confirmed, oxygen available and suction available   Procedure details (see MAR for exact dosages):    Preoxygenation:  Nasal cannula   Sedation:  Propofol   Intended level of sedation: deep   Analgesia:  Fentanyl   Intra-procedure monitoring:  Blood pressure monitoring, cardiac monitor, continuous pulse oximetry, frequent LOC assessments, frequent vital sign checks and continuous capnometry   Intra-procedure events: none     Total Provider sedation time (minutes):  10 Post-procedure details:    Post-sedation assessment completed:  01/10/2020 11:30 PM   Attendance: Constant attendance by certified staff until patient recovered     Recovery: Patient returned to pre-procedure baseline     Post-sedation assessments completed and reviewed: airway patency, cardiovascular function, hydration status, mental status, nausea/vomiting, pain level, respiratory function and temperature  Patient is stable for discharge or  admission: yes     Patient tolerance:  Tolerated well, no immediate complications .Ortho Injury Treatment  Date/Time: 01/10/2020 10:58 PM Performed by: Hayden Rasmussen, MD Authorized by: Hayden Rasmussen, MD   Consent:    Consent obtained:  Written   Consent given by:  Patient and spouse   Risks discussed:  Fracture, irreducible dislocation, recurrent dislocation, restricted joint movement, stiffness and nerve damage   Alternatives discussed:  No treatment, delayed treatment, immobilization and referralInjury location: shoulder Location details: right shoulder Injury type: dislocation Dislocation type: inferior Hill-Sachs deformity: yes Chronicity: new Pre-procedure neurovascular assessment: neurovascularly intact Pre-procedure distal perfusion: normal Pre-procedure neurological function: normal Pre-procedure range of motion: reduced  Anesthesia: Local anesthesia used: no  Patient sedated: Yes. Refer to sedation procedure documentation for details of sedation. Manipulation performed: yes Reduction successful: yes X-ray confirmed reduction: yes Immobilization: sling Post-procedure neurovascular assessment: post-procedure neurovascularly intact Post-procedure distal perfusion: normal Post-procedure neurological function: normal Post-procedure range of motion: improved Patient tolerance: patient tolerated the procedure well with no immediate complications  .Ortho Injury Treatment  Date/Time: 01/10/2020 10:59 PM Performed by: Hayden Rasmussen, MD Authorized by: Hayden Rasmussen, MD   Consent:    Consent obtained:  Verbal   Consent given by:  Patient   Risks discussed:  Fracture, irreducible dislocation, stiffness, restricted joint movement and nerve damage   Alternatives discussed:  No treatment, delayed treatment, immobilization and referralInjury location: finger Location details: left little finger Injury type: dislocation Dislocation type: PIP Pre-procedure  neurovascular assessment: neurovascularly intact Pre-procedure distal perfusion: normal Pre-procedure neurological function: normal Pre-procedure range of motion: reduced  Anesthesia: Local anesthesia used: no  Patient sedated: Yes. Refer to sedation procedure documentation for details of sedation. Manipulation performed: yes Reduction successful: yes X-ray confirmed reduction: yes Immobilization: splint Splint type: static finger Supplies used: aluminum splint Post-procedure neurovascular assessment: post-procedure neurovascularly intact Post-procedure distal perfusion: normal Post-procedure neurological function: normal Post-procedure range of motion: improved Patient tolerance: patient tolerated the procedure well with no immediate complications    (including critical care time)  Medications Ordered in ED Medications  fentaNYL (SUBLIMAZE) injection 50 mcg (50 mcg Intravenous Given 01/10/20 2241)  ondansetron (ZOFRAN) injection 4 mg (4 mg Intravenous Given 01/10/20 2054)  propofol (DIPRIVAN) 10 mg/mL bolus/IV push 38.6 mg (38.6 mg Intravenous Given by Other 01/10/20 2241)  HYDROcodone-acetaminophen (NORCO/VICODIN) 5-325 MG per tablet 1 tablet (1 tablet Oral Given 01/10/20 2357)    ED Course  I have reviewed the triage vital signs and the nursing notes.  Pertinent labs & imaging results that were available during my care of the patient were reviewed by me and considered in my medical decision making (see chart for details).    MDM Rules/Calculators/A&P                     Patient here for evaluation of injuries from a mechanical fall down a flight of stairs.  Significant right shoulder pain and exam consistent with dislocation.  Neurovascular intact.  Also likely left small finger dislocation.  Imaging confirms those findings.  Also had pain to right hip and right ankle which did not show any fracture or dislocation.  Likely contusion/sprain.  She was consented for procedural  sedation and closed reduction.  This was successful and she tolerated that well.  After return to normal mental status her vitals remained stable.  Her pain is much improved.  We will refer her onto orthopedics for close follow-up.  Return instructions discussed.  Final Clinical Impression(s) / ED Diagnoses Final diagnoses:  Fall, initial encounter  Traumatic closed displaced fracture of right shoulder with anterior dislocation, initial encounter  Closed dislocation of interphalangeal joint of left little finger  Sprain of right ankle, unspecified ligament, initial encounter  Contusion of right hip, initial encounter    Rx / DC Orders ED Discharge Orders    None       Hayden Rasmussen, MD 01/11/20 1350

## 2020-01-10 NOTE — ED Notes (Signed)
Pt in XR. 

## 2020-01-10 NOTE — Sedation Documentation (Signed)
R shoulder reduction complete.

## 2020-01-10 NOTE — ED Triage Notes (Signed)
Pt states she fell down approx 20 stairs after a dog jerked her. Pt denies LOC. Pt has obvious deformity to R shoulder, arm and hand. Pt also c/o pain to L pinky. Pain to R ankle/foot.

## 2020-01-10 NOTE — Discharge Instructions (Addendum)
You are seen in the emergency department for evaluation of injuries from a fall.  You had a dislocation of your right shoulder and a dislocation of your left small finger.  These were reduced and immobilized.  Please contact your orthopedic doctor Tuesday for close follow-up.  Ibuprofen for pain.  We are also prescribing you some hydrocodone for worsened pain.  Return to the emergency department for any worsening or concerning symptoms.

## 2020-01-10 NOTE — Sedation Documentation (Signed)
Arm sling and finger splint applied by EMT.

## 2020-01-10 NOTE — Sedation Documentation (Signed)
L fifth digit reduction complete.

## 2020-01-12 NOTE — ED Notes (Signed)
EDP gave V/O at bedside to give 60mcg Fentanyl instead of 11mcg. 50 mcg of Fentanyl given to pt during procedure, 50 mcg wasted post procedure by this RN and Mitzi Hansen, Therapist, sports.

## 2020-01-13 ENCOUNTER — Other Ambulatory Visit: Payer: Self-pay | Admitting: Internal Medicine

## 2020-01-13 DIAGNOSIS — S43004S Unspecified dislocation of right shoulder joint, sequela: Secondary | ICD-10-CM

## 2020-01-13 MED ORDER — DEXAMETHASONE 4 MG PO TABS: ORAL_TABLET | ORAL | 0 refills | Status: DC

## 2020-01-13 MED ORDER — HYDROMORPHONE HCL 4 MG PO TABS: ORAL_TABLET | ORAL | 0 refills | Status: DC

## 2020-01-13 MED ORDER — ONDANSETRON HCL 8 MG PO TABS
ORAL_TABLET | ORAL | 1 refills | Status: DC
Start: 2020-01-13 — End: 2020-08-27

## 2020-01-13 NOTE — Progress Notes (Signed)
   Patient s/p fall on 01/10/2020 with Rt shoulder Dislocation  (Reuced under Fentanyl & Propofol)  - has hx Oxycodone allergy and was  Rx'd Hydrocodone & experienced hives   =====================================================================  - Recommended take gZyrtec  (Certirizine) 10 mg 2 x /day   - Rx gDecadron 4 mg  Pulse / taper & Rx Zofran 8 mg   - Sent in Rx Dilaudid  4 mg & also recc take APAP 1,000 mg 4 x /day

## 2020-01-23 ENCOUNTER — Other Ambulatory Visit: Payer: Self-pay | Admitting: Internal Medicine

## 2020-01-23 DIAGNOSIS — R071 Chest pain on breathing: Secondary | ICD-10-CM

## 2020-01-24 ENCOUNTER — Other Ambulatory Visit: Payer: Self-pay

## 2020-01-24 ENCOUNTER — Ambulatory Visit
Admission: RE | Admit: 2020-01-24 | Discharge: 2020-01-24 | Disposition: A | Discharge status: Home / Self Care | Payer: 59 | Source: Ambulatory Visit | Attending: Internal Medicine | Admitting: Internal Medicine

## 2020-01-24 DIAGNOSIS — R071 Chest pain on breathing: Secondary | ICD-10-CM

## 2020-02-18 ENCOUNTER — Other Ambulatory Visit: Payer: Self-pay

## 2020-02-18 MED ORDER — DEXAMETHASONE 4 MG PO TABS: ORAL_TABLET | ORAL | 0 refills | Status: DC

## 2020-02-27 ENCOUNTER — Ambulatory Visit: Payer: 59 | Admitting: Physician Assistant

## 2020-02-28 ENCOUNTER — Telehealth: Payer: 59 | Admitting: Physician Assistant

## 2020-02-28 DIAGNOSIS — E782 Mixed hyperlipidemia: Secondary | ICD-10-CM | POA: Diagnosis not present

## 2020-02-28 DIAGNOSIS — Z6829 Body mass index (BMI) 29.0-29.9, adult: Secondary | ICD-10-CM | POA: Diagnosis not present

## 2020-02-28 MED ORDER — WEGOVY 0.5 MG/0.5ML ~~LOC~~ SOAJ: 0.5000 mg | SUBCUTANEOUS | 0 refills | Status: DC

## 2020-02-28 MED ORDER — WEGOVY 0.25 MG/0.5ML ~~LOC~~ SOAJ: 0.2500 mg | SUBCUTANEOUS | 0 refills | Status: DC

## 2020-02-28 NOTE — Telephone Encounter (Signed)
Patient's weight today was 178 with BMI of 29-30 with history of chol, depression, DJD presents for [facility] initiation. She has tried several medications for weight loss including phentermine, topamax, vyvanse, weight watchers, exercise without success.    Medications  Current Outpatient Medications (Endocrine & Metabolic):  .  dexamethasone (DECADRON) 4 MG tablet, Take 1 tab 3 x day - 3 days, then 2 x day - 3 days, then 1 tab daily for Allergic Reaction, Pain & Inflammation    Current Outpatient Medications (Analgesics):  Marland Kitchen  ASPIRIN 81 PO, Take by mouth. Marland Kitchen  HYDROcodone-acetaminophen (NORCO/VICODIN) 5-325 MG tablet, Take 1-2 tablets by mouth every 6 (six) hours as needed. Marland Kitchen  HYDROmorphone (DILAUDID) 4 MG tablet, Take 1/2 to 1 tablet every 3 to 4 hours as needed for Severe Pain   Current Outpatient Medications (Other):  .  amphetamine-dextroamphetamine (ADDERALL) 20 MG tablet, Take 1 tab daily as needed for focus, limit to 5 days a week or less to avoid addiction/tolerance. .  Ascorbic Acid (VITAMIN C) 1000 MG tablet, Take 2,000 mg by mouth daily. .  Cholecalciferol (VITAMIN D PO), Take 10,000 Units by mouth.  .  cyclobenzaprine (FLEXERIL) 10 MG tablet, Take 1/2 to 1 tab 2 to 3 x /day Only if needed for Muscle Spasm .  FIBER ADULT GUMMIES PO, Take by mouth 2 (two) times daily. .  Fiber POWD, Take by mouth daily. .  Magnesium 400 MG TABS, Take by mouth daily. .  ondansetron (ZOFRAN) 8 MG tablet, Take 1/2 to 1 tablet 3 x /day as needed for Nausea .  Polyethylene Glycol 3350 (MIRALAX PO), Take by mouth daily. Marland Kitchen  pyridOXINE (B-6) 50 MG tablet, Take 50 mg by mouth daily. Marland Kitchen  zolpidem (AMBIEN) 5 MG tablet, Take 5 mg by mouth at bedtime as needed for sleep.  Problem list She has Insomnia; Vitamin D deficiency; ADD (attention deficit disorder); Migraines; Allergy; Chronic lumbar pain; Nocturnal leg cramps; Recurrent major depression in remission ([place]); Overweight (BMI 25.0-29.9); Osteopenia; Mixed  hyperlipidemia; and Chronic constipation on their problem list.   Follow up 2 month for progress Patient counseled.

## 2020-03-04 ENCOUNTER — Other Ambulatory Visit: Payer: Self-pay | Admitting: Physician Assistant

## 2020-03-04 MED ORDER — WEGOVY 0.25 MG/0.5ML ~~LOC~~ SOAJ: 0.2500 mg | SUBCUTANEOUS | 0 refills | Status: DC

## 2020-03-04 MED ORDER — WEGOVY 0.5 MG/0.5ML ~~LOC~~ SOAJ: 0.5000 mg | SUBCUTANEOUS | 0 refills | Status: DC

## 2020-04-02 ENCOUNTER — Other Ambulatory Visit: Payer: Self-pay

## 2020-04-02 MED ORDER — AMPHETAMINE-DEXTROAMPHETAMINE 20 MG PO TABS: ORAL_TABLET | ORAL | 0 refills | Status: DC

## 2020-04-02 NOTE — Telephone Encounter (Signed)
Refill request for Adderall

## 2020-04-08 ENCOUNTER — Other Ambulatory Visit: Payer: Self-pay | Admitting: Physician Assistant

## 2020-04-08 MED ORDER — WEGOVY 1 MG/0.5ML ~~LOC~~ SOAJ: 1.0000 mg | SUBCUTANEOUS | 0 refills | Status: DC

## 2020-04-08 MED ORDER — WEGOVY 1.7 MG/0.75ML ~~LOC~~ SOAJ: 1.7000 mg | SUBCUTANEOUS | 0 refills | Status: DC

## 2020-04-30 ENCOUNTER — Other Ambulatory Visit: Payer: Self-pay | Admitting: Internal Medicine

## 2020-04-30 MED ORDER — MELOXICAM 15 MG PO TABS
ORAL_TABLET | ORAL | 0 refills | Status: DC
Start: 2020-04-30 — End: 2020-07-14

## 2020-05-06 ENCOUNTER — Other Ambulatory Visit: Payer: Self-pay | Admitting: Orthopaedic Surgery

## 2020-05-06 DIAGNOSIS — M25511 Pain in right shoulder: Secondary | ICD-10-CM

## 2020-05-07 MED ORDER — WEGOVY 1.7 MG/0.75ML ~~LOC~~ SOAJ: 1.7000 mg | SUBCUTANEOUS | 0 refills | Status: DC

## 2020-05-10 ENCOUNTER — Other Ambulatory Visit: Payer: Self-pay

## 2020-05-10 MED ORDER — CYCLOBENZAPRINE HCL 10 MG PO TABS: ORAL_TABLET | ORAL | 0 refills | Status: DC

## 2020-05-11 ENCOUNTER — Other Ambulatory Visit: Payer: Self-pay | Admitting: Physician Assistant

## 2020-05-11 MED ORDER — WEGOVY 2.4 MG/0.75ML ~~LOC~~ SOAJ: 2.4000 mg | SUBCUTANEOUS | 3 refills | Status: DC

## 2020-05-15 ENCOUNTER — Other Ambulatory Visit: Payer: Self-pay | Admitting: Physician Assistant

## 2020-05-15 MED ORDER — WEGOVY 1 MG/0.5ML ~~LOC~~ SOAJ: 1.0000 mg | SUBCUTANEOUS | 0 refills | Status: DC

## 2020-05-23 ENCOUNTER — Other Ambulatory Visit: Payer: Self-pay

## 2020-05-23 ENCOUNTER — Emergency Department (HOSPITAL_BASED_OUTPATIENT_CLINIC_OR_DEPARTMENT_OTHER): Payer: 59

## 2020-05-23 ENCOUNTER — Encounter (HOSPITAL_BASED_OUTPATIENT_CLINIC_OR_DEPARTMENT_OTHER): Payer: Self-pay | Admitting: Emergency Medicine

## 2020-05-23 ENCOUNTER — Emergency Department (HOSPITAL_BASED_OUTPATIENT_CLINIC_OR_DEPARTMENT_OTHER)
Admission: EM | Admit: 2020-05-23 | Discharge: 2020-05-23 | Disposition: A | Discharge status: Home / Self Care | Payer: 59 | Attending: Emergency Medicine | Admitting: Emergency Medicine

## 2020-05-23 DIAGNOSIS — S43004A Unspecified dislocation of right shoulder joint, initial encounter: Secondary | ICD-10-CM | POA: Diagnosis not present

## 2020-05-23 DIAGNOSIS — Y92092 Bedroom in other non-institutional residence as the place of occurrence of the external cause: Secondary | ICD-10-CM | POA: Diagnosis not present

## 2020-05-23 DIAGNOSIS — Y93B2 Activity, push-ups, pull-ups, sit-ups: Secondary | ICD-10-CM | POA: Diagnosis not present

## 2020-05-23 DIAGNOSIS — N182 Chronic kidney disease, stage 2 (mild): Secondary | ICD-10-CM | POA: Diagnosis not present

## 2020-05-23 DIAGNOSIS — W06XXXA Fall from bed, initial encounter: Secondary | ICD-10-CM | POA: Diagnosis not present

## 2020-05-23 DIAGNOSIS — M25511 Pain in right shoulder: Secondary | ICD-10-CM | POA: Diagnosis present

## 2020-05-23 MED ORDER — MORPHINE SULFATE (PF) 4 MG/ML IV SOLN
4.0000 mg | Freq: Once | INTRAVENOUS | Status: AC
  Administered 2020-05-23: 4 mg via INTRAVENOUS
  Filled 2020-05-23: qty 1

## 2020-05-23 NOTE — Discharge Instructions (Signed)
Please update your orthopedic team regarding your ED visit. Return to the ED with any new or worsening symptoms.

## 2020-05-23 NOTE — ED Triage Notes (Signed)
Patient presents with complaints of right shoulder pain; states hx of dislocation to right shoulder; states this am she rolled over in bed and felt right shoulder dislocate again; scheduled for MRI next week for same.

## 2020-05-23 NOTE — ED Triage Notes (Signed)
States occurred around 0100 this am; states took dilaudid tablet at 0130 and 0300.

## 2020-05-23 NOTE — ED Provider Notes (Signed)
Emergency Department Provider Note   I have reviewed the triage vital signs and the nursing notes.   HISTORY  Chief Complaint Shoulder Pain   HPI GWYNETH FERNANDEZ is a 54 y.o. female with PMH reviewed below including multiple right shoulder dislocations presents to the ED with right shoulder pain and concern that this as dislocated once again. Patient is followed by [facility]. She has dislocated this shoulder twice before. She was rolling over in bed when the shoulder again seems to have come out of place. She has f/u with ortho this coming week for MRI. Denies numbness in the hand/arm. No other injury. Pain is moderate but improved somewhat after taking PO Dilaudid PTA.    Past Medical History:  Diagnosis Date  . Abnormal cells of cervix    PAP  . ADD (attention deficit disorder)   . Allergy   . Anemia   . Anxiety   . 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 Chronic lumbar pain    Sacrum  . Insomnia   . Migraines   . Unspecified vitamin D deficiency     Patient Active Problem List   Diagnosis Date Noted  . Chronic constipation 07/04/2019  . Mixed hyperlipidemia 06/24/2019  . Osteopenia 08/17/2018  . Overweight (BMI 25.0-29.9) 06/20/2018  . Nocturnal leg cramps 06/20/2017  . Recurrent major depression in remission ([place]) 06/20/2017  . Insomnia   . Vitamin D deficiency   . ADD (attention deficit disorder)   . Migraines   . Allergy   . Chronic lumbar pain     Past Surgical History:  Procedure Laterality Date  . AUGMENTATION MAMMAPLASTY Bilateral   . BREAST BIOPSY Left 2018  . BREAST ENHANCEMENT SURGERY Bilateral 1991   Saline implants  . GYNECOLOGIC CRYOSURGERY    . NOVASURE ABLATION    . OOPHORECTOMY Right   . PAROTIDECTOMY Left 09/2008   benign  . TOOTH EXTRACTION Right 05/24/2018   #31 extraction    Allergies Percocet [oxycodone-acetaminophen]  Family History  Problem Relation Age of Onset  . Diabetes Brother   . Heart  disease Father   . Tongue cancer Father        squamous cell carcinoma mets  . Hypertension Father   . Cancer Father        sacrum/lung mets  . Depression Mother   . Breast cancer Paternal Aunt   . Breast cancer Maternal Aunt   . Heart disease Paternal Grandfather   . Stroke Paternal Grandfather   . Heart disease Maternal Grandfather   . Breast cancer Maternal Grandmother   . Suicidality Paternal Grandmother     Social History Social History   Tobacco Use  . Smoking status: Never Smoker  . Smokeless tobacco: Never Used  Vaping Use  . Vaping Use: Never used  Substance Use Topics  . Alcohol use: Yes    Comment: rarely-wine  . Drug use: No    Review of Systems  Constitutional: No fever/chills Musculoskeletal: Positive right shoulder pain.  Skin: Negative for rash. Neurological: Negative for numbness.  10-point ROS otherwise negative.  ____________________________________________   PHYSICAL EXAM:  VITAL SIGNS: ED Triage Vitals  Enc Vitals Group     BP 05/23/20 0412 113/72     Pulse Rate 05/23/20 0412 89     Resp 05/23/20 0412 16     Temp 05/23/20 0412 98.2 F (36.8 C)     Temp Source 05/23/20 0412 Oral     SpO2 05/23/20 0412  98 %     Weight 05/23/20 0408 169 lb 15.6 oz (77.1 kg)     Height 05/23/20 0408 5' 4.5" (1.638 m)   Constitutional: Alert and oriented. Well appearing and in no acute distress. Eyes: Conjunctivae are normal.  Head: Atraumatic. Nose: No congestion/rhinnorhea. Mouth/Throat: Mucous membranes are moist.  Neck: No stridor.   Cardiovascular: Good peripheral circulation.  Respiratory: Normal respiratory effort.   Gastrointestinal: No distention.  Musculoskeletal: Right arm held flexed with tenderness anteriorly consistent with anterior shoulder dislocation.  Neurologic:  Normal speech and language. No gross focal neurologic deficits are appreciated.  Skin:  Skin is warm, dry and intact. No rash  noted.   ____________________________________________  RADIOLOGY  DG Shoulder Right  Result Date: 05/23/2020 CLINICAL DATA:  Follow-up examination status post reduction. EXAM: RIGHT SHOULDER - 2+ VIEW COMPARISON:  Prior radiograph from earlier same day. FINDINGS: Right humeral head now in normal alignment with the glenoid. No fracture. AC joint approximated. Soft tissues unremarkable. Visualized right hemithorax clear. IMPRESSION: Normal alignment status post reduction of right shoulder dislocation. No fracture. Electronically Signed   By: Jeannine Boga M.D.   On: 05/23/2020 05:47   DG Shoulder Right  Result Date: 05/23/2020 CLINICAL DATA:  Right shoulder pain.  History of dislocations. EXAM: RIGHT SHOULDER - 2+ VIEW COMPARISON:  01/10/2020 FINDINGS: Anterior inferior dislocation of the humeral head. No definite Hill-Sachs fracture identified. IMPRESSION: Anterior-inferior glenohumeral joint dislocation. Electronically Signed   By: Abigail Miyamoto M.D.   On: 05/23/2020 04:40    ____________________________________________   PROCEDURES  Procedure(s) performed:   Reduction of dislocation  Date/Time: 05/23/2020 5:05 AM Performed by: Margette Fast, MD Authorized by: Margette Fast, MD  Preparation: Patient was prepped and draped in the usual sterile fashion. Local anesthesia used: no  Anesthesia: Local anesthesia used: no  Sedation: Patient sedated: no  Patient tolerance: patient tolerated the procedure well with no immediate complications Comments: After IV pain medication, the patient was placed in bed in upright sitting position with the right arm flexed. Gentle, steady pressure applied at the flexed right elbow with slow external rotation. Near immediate palpable reduction of the right shoulder appreciated with normal passive ROM on exam. Patient's pain significantly improved. Patient tolerated procedure well with no immediate complications. Shoulder placed in sling  immobilizer.       ____________________________________________   INITIAL IMPRESSION / ASSESSMENT AND PLAN / ED COURSE  Pertinent labs & imaging results that were available during my care of the patient were reviewed by me and considered in my medical decision making (see chart for details).   Patient presents emergency department with likely recurrent right shoulder dislocation.  The extremity is neurovascularly intact.  She has had successful closed reduction was on 2 occasions once with sedation and once without.  She would like to try pain medications and reduction without sedation if possible. Will confirm dislocation with plain film and reassess.   05:05 AM  Post-reduction films reviewed and independently interpreted by me showing successful reduction of the right shoulder. Patient placed back in sling and will f/u with her ortho team by phone. MRI scheduled for next week.  ____________________________________________  FINAL CLINICAL IMPRESSION(S) / ED DIAGNOSES  Final diagnoses:  Dislocation of right shoulder joint, initial encounter    MEDICATIONS GIVEN DURING THIS VISIT:  Medications  morphine 4 MG/ML injection 4 mg (4 mg Intravenous Given 05/23/20 0435)    Note:  This document was prepared using Dragon voice recognition software and may include  unintentional dictation errors.  Nanda Quinton, MD, [facility] Emergency Medicine    Nilam Quakenbush, [facility], MD 05/23/20 [phone]

## 2020-05-27 ENCOUNTER — Ambulatory Visit
Admission: RE | Admit: 2020-05-27 | Discharge: 2020-05-27 | Disposition: A | Discharge status: Home / Self Care | Payer: 59 | Source: Ambulatory Visit | Attending: Orthopaedic Surgery | Admitting: Orthopaedic Surgery

## 2020-05-27 ENCOUNTER — Other Ambulatory Visit: Payer: Self-pay

## 2020-05-27 DIAGNOSIS — M25511 Pain in right shoulder: Secondary | ICD-10-CM

## 2020-05-27 MED ORDER — IOPAMIDOL (ISOVUE-M 200) INJECTION 41%
15.0000 mL | Freq: Once | INTRAMUSCULAR | Status: AC
  Administered 2020-05-27: 15 mL via INTRA_ARTICULAR

## 2020-05-29 ENCOUNTER — Other Ambulatory Visit: Payer: Self-pay | Admitting: Internal Medicine

## 2020-05-29 MED ORDER — HYDROMORPHONE HCL 4 MG PO TABS
ORAL_TABLET | ORAL | 0 refills | Status: DC
Start: 2020-05-29 — End: 2020-06-12

## 2020-05-30 HISTORY — PX: SHOULDER ARTHROSCOPY: SHX128

## 2020-06-12 ENCOUNTER — Encounter: Payer: Self-pay | Admitting: Internal Medicine

## 2020-06-12 ENCOUNTER — Other Ambulatory Visit: Payer: Self-pay | Admitting: Internal Medicine

## 2020-06-12 MED ORDER — HYDROMORPHONE HCL 4 MG PO TABS
ORAL_TABLET | ORAL | 0 refills | Status: DC
Start: 2020-06-12 — End: 2020-08-27

## 2020-06-25 ENCOUNTER — Encounter: Payer: 59 | Admitting: Adult Health

## 2020-07-06 ENCOUNTER — Encounter: Payer: 59 | Admitting: Physician Assistant

## 2020-07-07 ENCOUNTER — Other Ambulatory Visit: Payer: Self-pay

## 2020-07-07 ENCOUNTER — Ambulatory Visit (INDEPENDENT_AMBULATORY_CARE_PROVIDER_SITE_OTHER): Payer: 59

## 2020-07-07 VITALS — Temp 98.1°F

## 2020-07-07 DIAGNOSIS — Z23 Encounter for immunization: Secondary | ICD-10-CM

## 2020-07-07 NOTE — Progress Notes (Signed)
Patient reports for FLU vaccine. Given in LEFT ARM. Patient had no concerns at this time.

## 2020-07-14 ENCOUNTER — Other Ambulatory Visit: Payer: Self-pay

## 2020-07-14 MED ORDER — AMPHETAMINE-DEXTROAMPHETAMINE 20 MG PO TABS
ORAL_TABLET | ORAL | 0 refills | Status: DC
Start: 2020-07-14 — End: 2020-09-14

## 2020-07-14 MED ORDER — MELOXICAM 15 MG PO TABS
ORAL_TABLET | ORAL | 1 refills | Status: DC
Start: 2020-07-14 — End: 2020-08-27

## 2020-07-14 MED ORDER — WEGOVY 1 MG/0.5ML ~~LOC~~ SOAJ: 1.0000 mg | SUBCUTANEOUS | 0 refills | Status: DC

## 2020-07-14 MED ORDER — CYCLOBENZAPRINE HCL 10 MG PO TABS
ORAL_TABLET | ORAL | 1 refills | Status: DC
Start: 2020-07-14 — End: 2020-08-27

## 2020-07-16 ENCOUNTER — Other Ambulatory Visit: Payer: Self-pay | Admitting: Adult Health

## 2020-07-16 MED ORDER — FLUCONAZOLE 150 MG PO TABS: 150.0000 mg | ORAL_TABLET | Freq: Once | ORAL | 3 refills | Status: AC

## 2020-07-16 NOTE — Progress Notes (Signed)
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 Diflucan 150 mg tab once, repeat in 1 week if needed. Follow up in office if not resolving.

## 2020-07-17 ENCOUNTER — Other Ambulatory Visit: Payer: Self-pay | Admitting: Internal Medicine

## 2020-07-17 MED ORDER — ROPINIROLE HCL 1 MG PO TABS
ORAL_TABLET | ORAL | 0 refills | Status: DC
Start: 2020-07-17 — End: 2021-12-01

## 2020-07-17 MED ORDER — TOPIRAMATE 50 MG PO TABS: ORAL_TABLET | ORAL | 0 refills | Status: DC

## 2020-08-11 ENCOUNTER — Other Ambulatory Visit: Payer: Self-pay

## 2020-08-11 ENCOUNTER — Other Ambulatory Visit: Payer: Self-pay | Admitting: Adult Health

## 2020-08-11 MED ORDER — WEGOVY 2.4 MG/0.75ML ~~LOC~~ SOAJ: 2.4000 mg | SUBCUTANEOUS | 2 refills | Status: DC

## 2020-08-24 ENCOUNTER — Other Ambulatory Visit: Payer: Self-pay

## 2020-08-24 MED ORDER — WEGOVY 2.4 MG/0.75ML ~~LOC~~ SOAJ: 2.4000 mg | SUBCUTANEOUS | 2 refills | Status: DC

## 2020-08-27 ENCOUNTER — Encounter: Payer: Self-pay | Admitting: Adult Health

## 2020-08-27 ENCOUNTER — Other Ambulatory Visit: Payer: Self-pay

## 2020-08-27 ENCOUNTER — Ambulatory Visit (INDEPENDENT_AMBULATORY_CARE_PROVIDER_SITE_OTHER): Payer: 59 | Admitting: Adult Health

## 2020-08-27 VITALS — BP 108/66 | HR 95 | Temp 97.7°F | Ht 64.0 in | Wt 171.0 lb

## 2020-08-27 DIAGNOSIS — Z1329 Encounter for screening for other suspected endocrine disorder: Secondary | ICD-10-CM

## 2020-08-27 DIAGNOSIS — E559 Vitamin D deficiency, unspecified: Secondary | ICD-10-CM

## 2020-08-27 DIAGNOSIS — Z0000 Encounter for general adult medical examination without abnormal findings: Secondary | ICD-10-CM

## 2020-08-27 DIAGNOSIS — T7840XS Allergy, unspecified, sequela: Secondary | ICD-10-CM

## 2020-08-27 DIAGNOSIS — M8580 Other specified disorders of bone density and structure, unspecified site: Secondary | ICD-10-CM

## 2020-08-27 DIAGNOSIS — G4700 Insomnia, unspecified: Secondary | ICD-10-CM

## 2020-08-27 DIAGNOSIS — E782 Mixed hyperlipidemia: Secondary | ICD-10-CM

## 2020-08-27 DIAGNOSIS — K5909 Other constipation: Secondary | ICD-10-CM

## 2020-08-27 DIAGNOSIS — G43009 Migraine without aura, not intractable, without status migrainosus: Secondary | ICD-10-CM

## 2020-08-27 DIAGNOSIS — Z79899 Other long term (current) drug therapy: Secondary | ICD-10-CM

## 2020-08-27 DIAGNOSIS — Z131 Encounter for screening for diabetes mellitus: Secondary | ICD-10-CM

## 2020-08-27 DIAGNOSIS — E663 Overweight: Secondary | ICD-10-CM

## 2020-08-27 DIAGNOSIS — E538 Deficiency of other specified B group vitamins: Secondary | ICD-10-CM

## 2020-08-27 DIAGNOSIS — R7309 Other abnormal glucose: Secondary | ICD-10-CM

## 2020-08-27 DIAGNOSIS — G4762 Sleep related leg cramps: Secondary | ICD-10-CM

## 2020-08-27 DIAGNOSIS — F3340 Major depressive disorder, recurrent, in remission, unspecified: Secondary | ICD-10-CM

## 2020-08-27 DIAGNOSIS — F988 Other specified behavioral and emotional disorders with onset usually occurring in childhood and adolescence: Secondary | ICD-10-CM

## 2020-08-27 DIAGNOSIS — Z1389 Encounter for screening for other disorder: Secondary | ICD-10-CM

## 2020-08-27 MED ORDER — METOCLOPRAMIDE HCL 5 MG PO TABS: 5.0000 mg | ORAL_TABLET | Freq: Three times a day (TID) | ORAL | 0 refills | Status: DC | PRN

## 2020-08-27 NOTE — Progress Notes (Signed)
Complete Physical  Assessment and Plan:  Salmai was seen today for annual exam.  Diagnoses and all orders for this visit:  Encounter for routine adult physical exam with abnormal findings Follows with GYN Dr. Nori Riis  Check with insurance about shingrix  Hyperlipidemia Mild, treated by lifestyle interventions Continue low cholesterol diet and exercise.  Check lipid panel.  -     Lipid panel  Attention deficit disorder (ADD) without hyperactivity Continue medications PRN Helps with focus, no AE's. The patient was counseled on the addictive nature of the medication and was encouraged to take drug holidays when not needed.   Chronic low back pain, unspecified back pain laterality, with sciatica presence unspecified       -     Chronic tailbone pain; managed by avoidance of pressure and medications as needed. Sees ortho.   Migraine without aura and without status migrainosus, not intractable Recently improved post tooth extration, well managed by OTC analgesics PRN -     Magnesium  Moderate episode of recurrent major depressive disorder in remission [facility]) She reports off of medications and doing much better; remission; monitor; failed numerous in the past, most recently on pristiq which worked well  Lifestyle discussed: diet/exerise, sleep hygiene, stress management, hydration  Insomnia, unspecified type       -      Moderately well managed by current medications, ambien via Dr. Nori Riis and melatonin  Overweight Long discussion about weight loss, diet, and exercise Recommended diet heavy in fruits and veggies and low in animal meats, cheeses, and dairy products, appropriate calorie intake Patient will work on exercise, particularly weight lifting and resistance exercise On topamax but questionable benefit;  Benefit with wegovy samples; will try to reorder this year, consider ozempic if glucose abnormality  Follow up at next visit  Vitamin D deficiency -     VITAMIN D 25 Hydroxy  (Vit-D Deficiency, Fractures)  Allergic state, sequela      -      Well managed by current medications  Nocturnal leg cramps ABI was normal, CBC, iron has been normal  Doing well with requip; increase walking  Medication management -     CBC with Differential/Platelet -     CMP/GFR  Screening for cardiovascular condition -     Lipid panel -     EKG 12-Lead - declines this year  Screening for hematuria or proteinuria -     Urinalysis, Complete (81001)  Screening for thyroid disorder -     TSH  Screening for diabetes mellitus -     Hemoglobin A1c  Chronic kidney disease (CKD), stage II (mild) -     COMPLETE METABOLIC PANEL WITH GFR  Chronic constipation Continue high fiber diet, plenty of water; increase walking  suspected slow transit etiology  Follow up if not improving to BM at least every other day   Discussed med's effects and SE's. Screening labs and tests as requested with regular follow-up as recommended. Over 40 minutes of exam, counseling, chart review, and complex, high level critical decision making was performed this visit.   Future Appointments  Date Time Provider [place]  08/30/2021  2:00 PM Liane Comber, NP [facility] None    HPI  55 y.o. married Caucasian female  presents for a complete physical and follow up for has Insomnia; Vitamin D deficiency; ADD (attention deficit disorder); Migraines; Allergy; Chronic lumbar pain; Nocturnal leg cramps; Recurrent major depression in remission ([place]); Overweight (BMI 25.0-29.9); Osteopenia; Mixed hyperlipidemia; and Chronic constipation on  their problem list..   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 Enjoys spending time with grandkids.   She had fall with R shoulder injury, repeated dislocations, underwent arthroscopic repair 05/30/2020, just released to PT last week, but hasn't started due to current covid peak.   Follows with GYN - Dr.  Nori Riis - for PAPs and mmg. He is prescribing estradiol and progesterone.   She reports some ongoing constipation for many years, has seen GI, no dx but mentioned "lazy colon" and was recommended daily soluble fiber. Has tried dulcolax which softened but didn't help move more frequently.   Patient is on an ADD medication, adderall 15 mg BID on work days, tried vyvanse but was too expensive, she states that the current medication is helping and she denies any adverse reactions. She is doing well with current regiment.   She has dx of moderate depression, has failed many agents, lexapro, zoloft, wellbutrin, did well with pristiq for 18 months but stopped and reprots is actually doing fairly with just her night time sleeping medication (ambien 5 mg via Dr. Nori Riis).   She reports she has been followed by ortho for cervical facet issues that seem to be contributing to "migraines" - possible tension headaches, triggered by stress- she reports her migraines were worked up extensively without attributable cause. She reported headaches have improved since after tooth extraction. Will be getting implants.   She has chronic intermittent lumbar pain attributed to sedentary job, she feels managed well with stretching, topical oils. Takes requip 2 mg in the evening for restless legs.   BMI is Body mass index is 29.35 kg/m., she has been working on diet; she is active with her grandkids. Weight has been an ongoing struggle. She has failed phentermine, currently on topamax, unsure of benefit, did get on wegovy with samples last year with good success though with insurance coverage issues.  Wt Readings from Last 3 Encounters:  08/27/20 171 lb (77.6 kg)  05/23/20 169 lb 15.6 oz (77.1 kg)  01/10/20 170 lb (77.1 kg)   Today their BP is BP: 108/66   She does workout. She denies chest pain, shortness of breath, dizziness.   She is not on cholesterol medication. Her cholesterol is not at goal. The cholesterol last visit  was:   Lab Results  Component Value Date   CHOL 206 (H) 07/04/2019   HDL 55 07/04/2019   LDLCALC 129 (H) 07/04/2019   TRIG 109 07/04/2019   CHOLHDL 3.7 07/04/2019    Last A1C in the office was:  Lab Results  Component Value Date   HGBA1C 5.2 07/04/2019   Last GFR: Lab Results  Component Value Date   GFRNONAA 72 07/04/2019   Patient is on Vitamin D supplement and at goal, taking 10000 IU:  Lab Results  Component Value Date   VD25OH 98 07/04/2019       Current Medications:  Current Outpatient Medications on File Prior to Visit  Medication Sig Dispense Refill  . amphetamine-dextroamphetamine (ADDERALL) 20 MG tablet Take 1 tab      Daily        as needed for ADD, Focus & Concentration - try  limit to 5 days a week or less to avoid addiction /tolerance. 90 tablet 0  . Ascorbic Acid (VITAMIN C) 1000 MG tablet Take 2,000 mg by mouth daily.    . ASPIRIN 81 PO Take by mouth.    . Cholecalciferol (VITAMIN D  PO) Take 10,000 Units by mouth.     . estradiol (MINIVELLE) 0.1 MG/24HR patch Place 1 patch onto the skin once a week.    Marland Kitchen PROGESTERONE PO Take 100 mg by mouth daily.    Marland Kitchen rOPINIRole (REQUIP) 1 MG tablet Take      1 to 2 tablets     at Bedtime        for Restless Legs 180 tablet 0  . Semaglutide-Weight Management (WEGOVY) 2.4 MG/0.75ML SOAJ Inject 2.4 mg into the skin once a week. 3 mL 2  . topiramate (TOPAMAX) 50 MG tablet Take 1/2 to 1 tablet 2 x /day at Suppertime & Bedtime for Dieting & Weight Loss 180 tablet 0  . zolpidem (AMBIEN) 5 MG tablet Take 5 mg by mouth at bedtime as needed for sleep.    . pyridOXINE (B-6) 50 MG tablet Take 50 mg by mouth daily. (Patient not taking: Reported on 08/27/2020)     No current facility-administered medications on file prior to visit.   Allergies:  Allergies  Allergen Reactions  . Percocet [Oxycodone-Acetaminophen]    Medical History:  She has Insomnia; Vitamin D deficiency; ADD (attention deficit disorder); Migraines; Allergy; Chronic  lumbar pain; Nocturnal leg cramps; Recurrent major depression in remission ([place]); Overweight (BMI 25.0-29.9); Osteopenia; Mixed hyperlipidemia; and Chronic constipation on their problem list. Health Maintenance:   Immunization History  Administered Date(s) Administered  . Influenza Inj Mdck Quad With Preservative 05/16/2018, 05/22/2019, 07/07/2020  . Influenza Split 05/09/2014  . Influenza-Unspecified 05/02/2013  . PFIZER SARS-COV-2 Vaccination 10/02/2019, 10/25/2019  . Pneumococcal-Unspecified 08/15/1997  . Td 08/15/2006  . Tdap 09/15/2012   Tetanus: 2014 Flu vaccine: 05/2020 Shingrix: Postpone, unavailable in office - will check with insurance and get at [facility] Covid 19: 2/2, 2021, pfizer   LMP: Patient has had an ablation.  Pap: ? 02/2020 norm, Dr. Juan Quam office annually  MGM: 04/2019 R diagnostic, benign, last bilateral 04/2019, due  DEXA: 04/2019 osteopenia Colonoscopy: 2014, 10 year recommend EGD: n/a  Last Dental Exam: 2021, goes q39m, Dr. Christie Nottingham  Last Eye Exam: 2021 Dr. Payton Emerald office, usually sees Dr. Delman Cheadle Skin exam: 2019, Dr. Ubaldo Glassing, no concerns  Patient Care Team: Unk Pinto, MD as PCP - General (Internal Medicine) Liane Comber, NP as Nurse Practitioner (Nurse Practitioner) Delfina Redwood (Physician Assistant)  Surgical History:  She has a past surgical history that includes Gynecologic cryosurgery; Novasure ablation; Oophorectomy (Right); Breast enhancement surgery (Bilateral, 1991); Parotidectomy (Left, 09/2008); Breast biopsy (Left, 2018); Tooth extraction (Right, 05/24/2018); Augmentation mammaplasty (Bilateral); Shoulder adhesion release (Right, 01/14/2016); Shoulder adhesion release (Left, 2012); and Shoulder arthroscopy (Right, 05/30/2020). Family History:  Herfamily history includes Breast cancer in her maternal aunt, maternal grandmother, and paternal aunt; Cancer in her father; Depression in her mother; Diabetes in her brother; Heart disease in  her father, maternal grandfather, and paternal grandfather; Hypertension in her father; Stroke in her paternal grandfather; Suicidality in her paternal grandmother; Tongue cancer in her father. Social History:  She reports that she has never smoked. She has never used smokeless tobacco. She reports current alcohol use. She reports that she does not use drugs.  Review of Systems: Review of Systems  Constitutional: Negative for chills, diaphoresis, fever, malaise/fatigue and weight loss.  HENT: Negative for ear pain, hearing loss and tinnitus.   Eyes: Negative for blurred vision, double vision, photophobia and pain.  Respiratory: Negative for cough, sputum production, shortness of breath and wheezing.   Cardiovascular: Negative for chest pain, palpitations, orthopnea,  claudication and leg swelling.  Gastrointestinal: Positive for constipation. Negative for abdominal pain, blood in stool, diarrhea, heartburn, melena, nausea and vomiting.  Genitourinary: Negative.  Negative for dysuria and urgency.  Musculoskeletal: Negative for joint pain, myalgias and neck pain.  Skin: Negative for rash.  Neurological: Negative for dizziness, tingling, sensory change, weakness and headaches.  Endo/Heme/Allergies: Positive for environmental allergies. Negative for polydipsia.  Psychiatric/Behavioral: Positive for depression. Negative for memory loss, substance abuse and suicidal ideas. The patient has insomnia. The patient is not nervous/anxious.   All other systems reviewed and are negative.   Physical Exam: Estimated body mass index is 29.35 kg/m as calculated from the following:   Height as of this encounter: 5\' 4"  (1.626 m).   Weight as of this encounter: 171 lb (77.6 kg). BP 108/66   Pulse 95   Temp 97.7 F (36.5 C)   Ht 5\' 4"  (1.626 m)   Wt 171 lb (77.6 kg)   SpO2 99%   BMI 29.35 kg/m  General Appearance: Well nourished, in no apparent distress.  Eyes: PERRLA, EOMs, conjunctiva no swelling or  erythema Sinuses: No Frontal/maxillary tenderness  ENT/Mouth: Ext aud canals clear, normal light reflex with TMs without erythema, bulging. Good dentition. No erythema, swelling, or exudate on post pharynx. Tonsils not swollen or erythematous. Hearing normal.  Neck: Supple, thyroid normal. No bruits  Respiratory: Respiratory effort normal, BS equal bilaterally without rales, rhonchi, wheezing or stridor.  Cardio: RRR without murmurs, rubs or gallops. Somewhat diminished distal pulses, 1+ bilaterally without edema.  Chest: symmetric, with normal excursions and percussion.  Breasts: Patient requests to defer, sees GYN Abdomen: Soft, BS x 4, no tenderness, no guarding, rebound, hernias, masses, or organomegaly.  Lymphatics: Non tender without lymphadenopathy.  Genitourinary: Defer to GYN Musculoskeletal: Full ROM all peripheral extremities,5/5 strength, and normal gait. Tenderness to R anterior/medial heel Skin: Warm, dry without rashes, ecchymosis, lesions.  Neuro: Cranial nerves intact, reflexes diminished throughout though equal bilaterally. Normal muscle tone, no cerebellar symptoms. Sensation intact.  Psych: Awake and oriented X 3, normal affect, Insight and Judgment appropriate.   EKG: WNL no ST changes  Izora Ribas 2:32 PM [facility]

## 2020-08-27 NOTE — Patient Instructions (Addendum)
Check with insurance about shingrix - can get at [facility]    Metoclopramide tablets What is this medicine? METOCLOPRAMIDE (met oh kloe PRA mide) is used to treat the symptoms of gastroesophageal reflux disease (GERD) like heartburn. It is also used to treat people with slow emptying of the stomach and intestinal tract. This medicine may be used for other purposes; ask your health care provider or pharmacist if you have questions. COMMON BRAND NAME(S): Reglan What should I tell my health care provider before I take this medicine? They need to know if you have any of these conditions:  breast cancer  depression  diabetes  heart failure  high blood pressure  if you often drink alcohol  kidney disease  liver disease  Parkinson's disease or a movement disorder  pheochromocytoma  seizures  stomach obstruction, bleeding, or perforation  an unusual or allergic reaction to metoclopramide, procainamide, other medicines, foods, dyes, or preservatives  pregnant or trying to get pregnant  breast-feeding How should I use this medicine? Take this medicine by mouth with a glass of water. Follow the directions on the prescription label. Take this medicine on an empty stomach, about 30 minutes before eating. Take your doses at regular intervals. Do not take your medicine more often than directed. Do not stop taking except on the advice of your doctor or health care professional. A special MedGuide will be given to you by the pharmacist with each prescription and refill. Be sure to read this information carefully each time. Talk to your pediatrician regarding the use of this medicine in children. Special care may be needed. Overdosage: If you think you have taken too much of this medicine contact a poison control center or emergency room at once. NOTE: This medicine is only for you. Do not share this medicine with others. What if I miss a dose? If you miss a dose, skip it. Take your  next dose at the normal time. Do not take extra or 2 doses at the same time to make up for the missed dose. What may interact with this medicine?  alcohol  antihistamines for allergy, cough, and cold  atovaquone  atropine  bupropion  certain medicines for anxiety or sleep  certain medicines for bladder problems like oxybutynin, tolterodine  certain medicines for depression or psychotic disorders  certain medicines for Parkinson's disease  certain medicines for seizures like phenobarbital, primidone  certain medicines for stomach problems like dicyclomine, hyoscyamine  certain medicines for travel sickness like scopolamine  cyclosporine  digoxin  fosfomycin  general anesthetics like halothane, isoflurane, methoxyflurane, propofol  insulin and other medicines for diabetes  ipratropium  MAOIs like Carbex, Eldepryl, Marplan, Nardil, and Parnate  medicines that relax muscles for surgery  narcotic medicines for pain  paroxetine  phenothiazines like chlorpromazine, mesoridazine, prochlorperazine, thioridazine  posaconazole  quinidine  sirolimus  tacrolimus This list may not describe all possible interactions. Give your health care provider a list of all the medicines, herbs, non-prescription drugs, or dietary supplements you use. Also tell them if you smoke, drink alcohol, or use illegal drugs. Some items may interact with your medicine. What should I watch for while using this medicine? It may take a few weeks for your stomach condition to start to get better. However, do not take this medicine for longer than 12 weeks. The longer you take this medicine, and the more you take it, the greater your chances are of developing serious side effects. If you are an elderly patient, a  female patient, or you have diabetes, you may be at an increased risk for side effects from this medicine. Contact your doctor immediately if you start having movements you cannot control such  as lip smacking, rapid movements of the tongue, involuntary or uncontrollable movements of the eyes, head, arms and legs, or muscle twitches and spasms. Patients and their families should watch out for worsening depression or thoughts of suicide. Also watch out for any sudden or severe changes in feelings such as feeling anxious, agitated, panicky, irritable, hostile, aggressive, impulsive, severely restless, overly excited and hyperactive, or not being able to sleep. If this happens, especially at the beginning of treatment or after a change in dose, call your doctor. Do not treat yourself for high fever. Ask your doctor or health care professional for advice. You may get drowsy or dizzy. Do not drive, use machinery, or do anything that needs mental alertness until you know how this drug affects you. Do not stand or sit up quickly, especially if you are an older patient. This reduces the risk of dizzy or fainting spells. Alcohol can make you more drowsy and dizzy. Avoid alcoholic drinks. What side effects may I notice from receiving this medicine? Side effects that you should report to your doctor or health care professional as soon as possible:  allergic reactions like skin rash, itching or hives, swelling of the face, lips, or tongue  abnormal production of milk  breast enlargement in both males and females  change in sex drive or performance  depressed mood  menstrual changes  restlessness, pacing, inability to keep still  signs and symptoms of neuroleptic malignant syndrome (NMS) like confusion; fast, irregular heartbeat; high fever; increased sweating; uncontrollable head, mouth, neck, arm, or leg movements; stiff muscles  seizures  suicidal thoughts, mood changes  swelling of the ankles, feet, hands  tremor  uncontrollable movements of the face, head, mouth, neck, arms, legs, or upper body  unusually weak or tired Side effects that usually do not require medical attention  (report to your doctor or health care professional if they continue or are bothersome):  dizziness  drowsiness  headache  tiredness This list may not describe all possible side effects. Call your doctor for medical advice about side effects. You may report side effects to FDA at 1-800-FDA-1088. Where should I keep my medicine? Keep out of the reach of children. Store at room temperature between 20 and 25 degrees C (68 and 77 degrees F). Protect from light. Keep container tightly closed. Throw away any unused medicine after the expiration date. NOTE: This sheet is a summary. It may not cover all possible information. If you have questions about this medicine, talk to your doctor, pharmacist, or health care provider.  2021 Elsevier/Gold Standard (2019-03-25 10:16:27)     Recombinant Zoster (Shingles) Vaccine: What You Need to Know 1. Why get vaccinated? Recombinant zoster (shingles) vaccine can prevent shingles. Shingles (also called herpes zoster, or just zoster) is a painful skin rash, usually with blisters. In addition to the rash, shingles can cause fever, headache, chills, or upset stomach. More rarely, shingles can lead to pneumonia, hearing problems, blindness, brain inflammation (encephalitis), or death. The most common complication of shingles is long-term nerve pain called postherpetic neuralgia (PHN). PHN occurs in the areas where the shingles rash was, even after the rash clears up. It can last for months or years after the rash goes away. The pain from PHN can be severe and debilitating. About 10 to 18% of  people who get shingles will experience PHN. The risk of PHN increases with age. An older adult with shingles is more likely to develop PHN and have longer lasting and more severe pain than a younger person with shingles. Shingles is caused by the varicella zoster virus, the same virus that causes chickenpox. After you have chickenpox, the virus stays in your body and can cause  shingles later in life. Shingles cannot be passed from one person to another, but the virus that causes shingles can spread and cause chickenpox in someone who had never had chickenpox or received chickenpox vaccine. 2. Recombinant shingles vaccine Recombinant shingles vaccine provides strong protection against shingles. By preventing shingles, recombinant shingles vaccine also protects against PHN. Recombinant shingles vaccine is the preferred vaccine for the prevention of shingles. However, a different vaccine, live shingles vaccine, may be used in some circumstances. The recombinant shingles vaccine is recommended for adults 50 years and older without serious immune problems. It is given as a two-dose series. This vaccine is also recommended for people who have already gotten another type of shingles vaccine, the live shingles vaccine. There is no live virus in this vaccine. Shingles vaccine may be given at the same time as other vaccines. 3. Talk with your health care provider Tell your vaccine provider if the person getting the vaccine:  Has had an allergic reaction after a previous dose of recombinant shingles vaccine, or has any severe, life-threatening allergies.  Is pregnant or breastfeeding.  Is currently experiencing an episode of shingles. In some cases, your health care provider may decide to postpone shingles vaccination to a future visit. People with minor illnesses, such as a cold, may be vaccinated. People who are moderately or severely ill should usually wait until they recover before getting recombinant shingles vaccine. Your health care provider can give you more information. 4. Risks of a vaccine reaction  A sore arm with mild or moderate pain is very common after recombinant shingles vaccine, affecting about 80% of vaccinated people. Redness and swelling can also happen at the site of the injection.  Tiredness, muscle pain, headache, shivering, fever, stomach pain, and  nausea happen after vaccination in more than half of people who receive recombinant shingles vaccine. In clinical trials, about 1 out of 6 people who got recombinant zoster vaccine experienced side effects that prevented them from doing regular activities. Symptoms usually went away on their own in 2 to 3 days. You should still get the second dose of recombinant zoster vaccine even if you had one of these reactions after the first dose. People sometimes faint after medical procedures, including vaccination. Tell your provider if you feel dizzy or have vision changes or ringing in the ears. As with any medicine, there is a very remote chance of a vaccine causing a severe allergic reaction, other serious injury, or death. 5. What if there is a serious problem? An allergic reaction could occur after the vaccinated person leaves the clinic. If you see signs of a severe allergic reaction (hives, swelling of the face and throat, difficulty breathing, a fast heartbeat, dizziness, or weakness), call 9-1-1 and get the person to the nearest hospital. For other signs that concern you, call your health care provider. Adverse reactions should be reported to the Vaccine Adverse Event Reporting System (VAERS). Your health care provider will usually file this report, or you can do it yourself. Visit the VAERS website at www.vaers.SamedayNews.es or call 670-441-7121. VAERS is only for reporting reactions, and VAERS staff  do not give medical advice. 6. How can I learn more?  Ask your health care provider.  Call your local or state health department.  Contact the Centers for Disease Control and Prevention (CDC): ? Call 346-301-6937 (1-800-CDC-INFO) or ? Visit CDC's website at http://hunter.com/ Vaccine Information Statement Recombinant Zoster Vaccine (06/13/2018) This information is not intended to replace advice given to you by your health care provider. Make sure you discuss any questions you have with your health  care provider. Document Revised: 04/03/2020 Document Reviewed: 04/03/2020 Elsevier Patient Education  [place].

## 2020-08-28 ENCOUNTER — Encounter: Payer: Self-pay | Admitting: Adult Health

## 2020-08-28 DIAGNOSIS — E538 Deficiency of other specified B group vitamins: Secondary | ICD-10-CM | POA: Insufficient documentation

## 2020-08-28 DIAGNOSIS — E161 Other hypoglycemia: Secondary | ICD-10-CM | POA: Insufficient documentation

## 2020-08-28 LAB — LIPID PANEL
Cholesterol: 167 mg/dL (ref <200)
HDL: 43 mg/dL — ABNORMAL LOW (ref >=50)
LDL Cholesterol (Calc): 102 mg/dL (calc) — ABNORMAL HIGH
Non-HDL Cholesterol (Calc): 124 mg/dL (calc) (ref <130)
Total CHOL/HDL Ratio: 3.9 (calc) (ref <5.0)
Triglycerides: 128 mg/dL (ref <150)

## 2020-08-28 LAB — COMPLETE METABOLIC PANEL WITH GFR
AG Ratio: 2 (calc) (ref 1.0–2.5)
ALT: 12 U/L (ref 6–29)
AST: 13 U/L (ref 10–35)
Albumin: 4.4 g/dL (ref 3.6–5.1)
Alkaline phosphatase (APISO): 85 U/L (ref 37–153)
BUN: 15 mg/dL (ref 7–25)
CO2: 29 mmol/L (ref 20–32)
Calcium: 9.5 mg/dL (ref 8.6–10.4)
Chloride: 104 mmol/L (ref 98–110)
Creat: 0.83 mg/dL (ref 0.50–1.05)
GFR, Est African American: 92 mL/min/{1.73_m2} (ref >=60)
GFR, Est Non African American: 79 mL/min/{1.73_m2} (ref >=60)
Globulin: 2.2 g/dL (calc) (ref 1.9–3.7)
Glucose, Bld: 88 mg/dL (ref 65–99)
Potassium: 4.3 mmol/L (ref 3.5–5.3)
Sodium: 139 mmol/L (ref 135–146)
Total Bilirubin: 0.2 mg/dL (ref 0.2–1.2)
Total Protein: 6.6 g/dL (ref 6.1–8.1)

## 2020-08-28 LAB — CBC WITH DIFFERENTIAL/PLATELET
Absolute Monocytes: 638 cells/uL (ref 200–950)
Basophils Absolute: 31 cells/uL (ref 0–200)
Basophils Relative: 0.6 %
Eosinophils Absolute: 31 cells/uL (ref 15–500)
Eosinophils Relative: 0.6 %
HCT: 36.7 % (ref 35.0–45.0)
Hemoglobin: 12.3 g/dL (ref 11.7–15.5)
Lymphs Abs: 785 cells/uL — ABNORMAL LOW (ref 850–3900)
MCH: 29.1 pg (ref 27.0–33.0)
MCHC: 33.5 g/dL (ref 32.0–36.0)
MCV: 86.8 fL (ref 80.0–100.0)
MPV: 9 fL (ref 7.5–12.5)
Monocytes Relative: 12.5 %
Neutro Abs: 3616 cells/uL (ref 1500–7800)
Neutrophils Relative %: 70.9 %
Platelets: 387 10*3/uL (ref 140–400)
RBC: 4.23 10*6/uL (ref 3.80–5.10)
RDW: 13.1 % (ref 11.0–15.0)
Total Lymphocyte: 15.4 %
WBC: 5.1 10*3/uL (ref 3.8–10.8)

## 2020-08-28 LAB — MAGNESIUM: Magnesium: 2 mg/dL (ref 1.5–2.5)

## 2020-08-28 LAB — HEMOGLOBIN A1C
Hgb A1c MFr Bld: 4.9 % of total Hgb (ref <5.7)
Mean Plasma Glucose: 94 mg/dL
eAG (mmol/L): 5.2 mmol/L

## 2020-08-28 LAB — VITAMIN D 25 HYDROXY (VIT D DEFICIENCY, FRACTURES): Vit D, 25-Hydroxy: 116 ng/mL — ABNORMAL HIGH (ref 30–100)

## 2020-08-28 LAB — INSULIN, RANDOM: Insulin: 22 u[IU]/mL — ABNORMAL HIGH

## 2020-08-28 LAB — URINALYSIS, ROUTINE W REFLEX MICROSCOPIC
Bilirubin Urine: NEGATIVE
Glucose, UA: NEGATIVE
Hgb urine dipstick: NEGATIVE
Ketones, ur: NEGATIVE
Leukocytes,Ua: NEGATIVE
Nitrite: NEGATIVE
Protein, ur: NEGATIVE
Specific Gravity, Urine: 1.011 (ref 1.001–1.03)
pH: 7.5 (ref 5.0–8.0)

## 2020-08-28 LAB — VITAMIN B12: Vitamin B-12: 280 pg/mL (ref 200–1100)

## 2020-08-28 LAB — TSH: TSH: 1.96 mIU/L

## 2020-08-29 ENCOUNTER — Other Ambulatory Visit: Payer: Self-pay | Admitting: Internal Medicine

## 2020-08-29 MED ORDER — PROMETHAZINE-DM 6.25-15 MG/5ML PO SYRP: ORAL_SOLUTION | ORAL | 1 refills | Status: DC

## 2020-08-29 MED ORDER — BENZONATATE 200 MG PO CAPS: ORAL_CAPSULE | ORAL | 1 refills | Status: DC

## 2020-08-29 MED ORDER — DEXAMETHASONE 4 MG PO TABS
ORAL_TABLET | ORAL | 0 refills | Status: DC
Start: 2020-08-29 — End: 2021-01-27

## 2020-08-29 MED ORDER — DEXAMETHASONE 4 MG PO TABS: ORAL_TABLET | ORAL | 0 refills | Status: DC

## 2020-08-29 MED ORDER — AZITHROMYCIN 250 MG PO TABS: ORAL_TABLET | ORAL | 1 refills | Status: DC

## 2020-08-29 MED ORDER — BENZONATATE 200 MG PO CAPS
ORAL_CAPSULE | ORAL | 1 refills | Status: DC
Start: 2020-08-29 — End: 2021-01-27

## 2020-09-01 ENCOUNTER — Other Ambulatory Visit: Payer: Self-pay | Admitting: Adult Health Nurse Practitioner

## 2020-09-01 ENCOUNTER — Encounter: Payer: Self-pay | Admitting: Adult Health Nurse Practitioner

## 2020-09-01 DIAGNOSIS — L309 Dermatitis, unspecified: Secondary | ICD-10-CM

## 2020-09-07 ENCOUNTER — Other Ambulatory Visit: Payer: Self-pay

## 2020-09-07 MED ORDER — WEGOVY 2.4 MG/0.75ML ~~LOC~~ SOAJ: 2.4000 mg | SUBCUTANEOUS | 2 refills | Status: DC

## 2020-09-14 ENCOUNTER — Other Ambulatory Visit: Payer: Self-pay | Admitting: Adult Health

## 2020-09-14 MED ORDER — AMPHETAMINE-DEXTROAMPHETAMINE 20 MG PO TABS: ORAL_TABLET | ORAL | 0 refills | Status: DC

## 2020-09-16 ENCOUNTER — Other Ambulatory Visit: Payer: Self-pay | Admitting: Adult Health

## 2020-09-16 DIAGNOSIS — Z1231 Encounter for screening mammogram for malignant neoplasm of breast: Secondary | ICD-10-CM

## 2020-09-16 DIAGNOSIS — M8580 Other specified disorders of bone density and structure, unspecified site: Secondary | ICD-10-CM

## 2020-11-04 ENCOUNTER — Other Ambulatory Visit: Payer: Self-pay | Admitting: Adult Health

## 2020-11-04 MED ORDER — AMPHETAMINE-DEXTROAMPHETAMINE 20 MG PO TABS: ORAL_TABLET | ORAL | 0 refills | Status: DC

## 2020-11-04 NOTE — Progress Notes (Signed)
Future Appointments  Date Time Provider [place]  05/07/2021 12:30 PM [facility] MM 3 [facility]  05/07/2021  1:00 PM [facility] DX DEXA 1 [facility]  08/30/2021  2:00 PM Liane Comber, NP [facility] None   PDMP reviewed for adderall refill request.

## 2020-12-16 ENCOUNTER — Other Ambulatory Visit: Payer: Self-pay | Admitting: Adult Health

## 2020-12-16 DIAGNOSIS — R7301 Impaired fasting glucose: Secondary | ICD-10-CM

## 2020-12-16 MED ORDER — OZEMPIC (0.25 OR 0.5 MG/DOSE) 2 MG/1.5ML ~~LOC~~ SOPN: PEN_INJECTOR | SUBCUTANEOUS | 0 refills | Status: DC

## 2020-12-18 ENCOUNTER — Other Ambulatory Visit: Payer: Self-pay | Admitting: Internal Medicine

## 2020-12-18 DIAGNOSIS — R7309 Other abnormal glucose: Secondary | ICD-10-CM

## 2020-12-18 DIAGNOSIS — R7301 Impaired fasting glucose: Secondary | ICD-10-CM

## 2020-12-18 MED ORDER — OZEMPIC (0.25 OR 0.5 MG/DOSE) 2 MG/1.5ML ~~LOC~~ SOPN: PEN_INJECTOR | SUBCUTANEOUS | 0 refills | Status: DC

## 2021-01-14 ENCOUNTER — Other Ambulatory Visit: Payer: Self-pay | Admitting: Adult Health

## 2021-01-14 MED ORDER — AMPHETAMINE-DEXTROAMPHETAMINE 20 MG PO TABS: ORAL_TABLET | ORAL | 0 refills | Status: DC

## 2021-01-14 NOTE — Progress Notes (Signed)
Future Appointments  Date Time Provider [place]  05/07/2021 12:30 PM [facility] MM 3 [facility]  05/07/2021  1:00 PM [facility] DX DEXA 1 [facility]  08/30/2021  2:00 PM Liane Comber, NP [facility] None    PDMP reviewed for adderall refill request.

## 2021-01-27 ENCOUNTER — Other Ambulatory Visit: Payer: Self-pay | Admitting: Internal Medicine

## 2021-02-17 ENCOUNTER — Other Ambulatory Visit: Payer: Self-pay | Admitting: Nurse Practitioner

## 2021-02-17 DIAGNOSIS — H60503 Unspecified acute noninfective otitis externa, bilateral: Secondary | ICD-10-CM

## 2021-02-17 MED ORDER — OFLOXACIN 0.3 % OT SOLN: 5.0000 [drp] | Freq: Two times a day (BID) | OTIC | 0 refills | Status: AC

## 2021-02-24 ENCOUNTER — Other Ambulatory Visit: Payer: Self-pay | Admitting: Internal Medicine

## 2021-02-24 MED ORDER — DOXYCYCLINE HYCLATE 100 MG PO CAPS: ORAL_CAPSULE | ORAL | 0 refills | Status: DC

## 2021-02-24 MED ORDER — DEXAMETHASONE 4 MG PO TABS
ORAL_TABLET | ORAL | 0 refills | Status: DC
Start: 2021-02-24 — End: 2021-03-15

## 2021-03-15 ENCOUNTER — Other Ambulatory Visit: Payer: Self-pay | Admitting: Internal Medicine

## 2021-03-15 MED ORDER — BENZONATATE 200 MG PO CAPS: ORAL_CAPSULE | ORAL | 1 refills | Status: DC

## 2021-03-15 MED ORDER — DEXAMETHASONE 4 MG PO TABS: ORAL_TABLET | ORAL | 0 refills | Status: DC

## 2021-04-01 ENCOUNTER — Other Ambulatory Visit: Payer: Self-pay | Admitting: Nurse Practitioner

## 2021-04-01 DIAGNOSIS — R059 Cough, unspecified: Secondary | ICD-10-CM

## 2021-04-01 MED ORDER — ALBUTEROL SULFATE HFA 108 (90 BASE) MCG/ACT IN AERS: 2.0000 | INHALATION_SPRAY | RESPIRATORY_TRACT | 0 refills | Status: DC | PRN

## 2021-04-01 NOTE — Progress Notes (Signed)
Pt having persistent coughing after recent ear infection, URI.  Will use Albuterol inhaler bid for 4 days and qd for 4 days and then PRN.

## 2021-05-07 ENCOUNTER — Ambulatory Visit
Admission: RE | Admit: 2021-05-07 | Discharge: 2021-05-07 | Disposition: A | Discharge status: Home / Self Care | Payer: 59 | Source: Ambulatory Visit | Attending: Adult Health | Admitting: Adult Health

## 2021-05-07 ENCOUNTER — Other Ambulatory Visit: Payer: Self-pay | Admitting: Adult Health

## 2021-05-07 ENCOUNTER — Other Ambulatory Visit: Payer: Self-pay

## 2021-05-07 DIAGNOSIS — Z1231 Encounter for screening mammogram for malignant neoplasm of breast: Secondary | ICD-10-CM

## 2021-05-07 DIAGNOSIS — M8580 Other specified disorders of bone density and structure, unspecified site: Secondary | ICD-10-CM

## 2021-05-07 MED ORDER — MOUNJARO 2.5 MG/0.5ML ~~LOC~~ SOAJ: SUBCUTANEOUS | 0 refills | Status: DC

## 2021-05-31 ENCOUNTER — Other Ambulatory Visit: Payer: Self-pay

## 2021-05-31 ENCOUNTER — Ambulatory Visit (INDEPENDENT_AMBULATORY_CARE_PROVIDER_SITE_OTHER): Payer: 59

## 2021-05-31 VITALS — Temp 97.5°F

## 2021-05-31 DIAGNOSIS — Z23 Encounter for immunization: Secondary | ICD-10-CM

## 2021-05-31 NOTE — Progress Notes (Signed)
Patient presents today for a flu shot.  

## 2021-06-02 ENCOUNTER — Other Ambulatory Visit: Payer: Self-pay

## 2021-06-02 MED ORDER — MOUNJARO 2.5 MG/0.5ML ~~LOC~~ SOAJ: SUBCUTANEOUS | 0 refills | Status: DC

## 2021-06-03 ENCOUNTER — Other Ambulatory Visit: Payer: Self-pay

## 2021-06-03 MED ORDER — MOUNJARO 2.5 MG/0.5ML ~~LOC~~ SOAJ: SUBCUTANEOUS | 0 refills | Status: DC

## 2021-06-18 ENCOUNTER — Other Ambulatory Visit: Payer: Self-pay | Admitting: Adult Health

## 2021-06-18 MED ORDER — MOUNJARO 5 MG/0.5ML ~~LOC~~ SOAJ: 5.0000 mg | SUBCUTANEOUS | 0 refills | Status: DC

## 2021-07-19 ENCOUNTER — Encounter: Payer: Self-pay | Admitting: Adult Health

## 2021-07-19 ENCOUNTER — Other Ambulatory Visit: Payer: Self-pay | Admitting: Nurse Practitioner

## 2021-07-19 MED ORDER — MOUNJARO 12.5 MG/0.5ML ~~LOC~~ SOAJ: 12.5000 mg | SUBCUTANEOUS | 2 refills | Status: DC

## 2021-07-19 MED ORDER — MOUNJARO 7.5 MG/0.5ML ~~LOC~~ SOAJ: 7.5000 mg | SUBCUTANEOUS | 2 refills | Status: DC

## 2021-07-19 MED ORDER — ONDANSETRON HCL 4 MG PO TABS: 4.0000 mg | ORAL_TABLET | Freq: Every day | ORAL | 1 refills | Status: DC | PRN

## 2021-07-19 NOTE — Progress Notes (Signed)
Rx for [facility] sent to [facility], duplicate since first was accidentally sent to [facility]

## 2021-07-20 ENCOUNTER — Other Ambulatory Visit: Payer: Self-pay | Admitting: Internal Medicine

## 2021-07-20 ENCOUNTER — Encounter: Payer: Self-pay | Admitting: Adult Health

## 2021-07-20 DIAGNOSIS — R112 Nausea with vomiting, unspecified: Secondary | ICD-10-CM

## 2021-07-20 MED ORDER — ONDANSETRON HCL 8 MG PO TABS: ORAL_TABLET | ORAL | 1 refills | Status: DC

## 2021-08-27 ENCOUNTER — Encounter: Payer: Self-pay | Admitting: Adult Health

## 2021-08-27 ENCOUNTER — Other Ambulatory Visit: Payer: Self-pay | Admitting: Adult Health

## 2021-08-27 MED ORDER — METHOCARBAMOL 500 MG PO TABS: 500.0000 mg | ORAL_TABLET | Freq: Four times a day (QID) | ORAL | 1 refills | Status: DC | PRN

## 2021-08-30 ENCOUNTER — Encounter: Payer: 59 | Admitting: Adult Health

## 2021-09-08 ENCOUNTER — Encounter: Payer: Self-pay | Admitting: Adult Health

## 2021-09-08 ENCOUNTER — Ambulatory Visit (INDEPENDENT_AMBULATORY_CARE_PROVIDER_SITE_OTHER): Payer: 59 | Admitting: Adult Health

## 2021-09-08 ENCOUNTER — Other Ambulatory Visit: Payer: Self-pay

## 2021-09-08 VITALS — BP 98/64 | HR 88 | Temp 97.9°F | Ht 64.25 in | Wt 159.0 lb

## 2021-09-08 DIAGNOSIS — F988 Other specified behavioral and emotional disorders with onset usually occurring in childhood and adolescence: Secondary | ICD-10-CM

## 2021-09-08 DIAGNOSIS — Z0000 Encounter for general adult medical examination without abnormal findings: Secondary | ICD-10-CM

## 2021-09-08 DIAGNOSIS — M2550 Pain in unspecified joint: Secondary | ICD-10-CM

## 2021-09-08 DIAGNOSIS — E663 Overweight: Secondary | ICD-10-CM

## 2021-09-08 DIAGNOSIS — F3340 Major depressive disorder, recurrent, in remission, unspecified: Secondary | ICD-10-CM

## 2021-09-08 DIAGNOSIS — E161 Other hypoglycemia: Secondary | ICD-10-CM

## 2021-09-08 DIAGNOSIS — M8580 Other specified disorders of bone density and structure, unspecified site: Secondary | ICD-10-CM

## 2021-09-08 DIAGNOSIS — Z130 Encounter for screening for diseases of the blood and blood-forming organs and certain disorders involving the immune mechanism: Secondary | ICD-10-CM

## 2021-09-08 DIAGNOSIS — G43009 Migraine without aura, not intractable, without status migrainosus: Secondary | ICD-10-CM

## 2021-09-08 DIAGNOSIS — E538 Deficiency of other specified B group vitamins: Secondary | ICD-10-CM

## 2021-09-08 DIAGNOSIS — E559 Vitamin D deficiency, unspecified: Secondary | ICD-10-CM

## 2021-09-08 DIAGNOSIS — Z13228 Encounter for screening for other metabolic disorders: Secondary | ICD-10-CM

## 2021-09-08 DIAGNOSIS — G4700 Insomnia, unspecified: Secondary | ICD-10-CM

## 2021-09-08 DIAGNOSIS — Z131 Encounter for screening for diabetes mellitus: Secondary | ICD-10-CM

## 2021-09-08 DIAGNOSIS — Z0001 Encounter for general adult medical examination with abnormal findings: Secondary | ICD-10-CM

## 2021-09-08 DIAGNOSIS — Z1389 Encounter for screening for other disorder: Secondary | ICD-10-CM

## 2021-09-08 DIAGNOSIS — E782 Mixed hyperlipidemia: Secondary | ICD-10-CM

## 2021-09-08 DIAGNOSIS — K5909 Other constipation: Secondary | ICD-10-CM

## 2021-09-08 DIAGNOSIS — Z1329 Encounter for screening for other suspected endocrine disorder: Secondary | ICD-10-CM

## 2021-09-08 MED ORDER — MOUNJARO 15 MG/0.5ML ~~LOC~~ SOAJ: 15.0000 mg | SUBCUTANEOUS | 5 refills | Status: DC

## 2021-09-08 NOTE — Progress Notes (Signed)
Complete Physical  Assessment and Plan:  Carla Fuller was seen today for annual exam.  Diagnoses and all orders for this visit:  Encounter for Annual Physical Exam with abnormal findings Due annually  Health Maintenance reviewed Healthy lifestyle reviewed and goals set  Follows with GYN Dr. [name] Mammograms at breast center Check with insurance about shingrix  Hyperlipidemia Mild, treated by lifestyle interventions Continue low cholesterol diet and exercise.  Check lipid panel.  -     Lipid panel  Attention deficit disorder (ADD) without hyperactivity Continue medications PRN Helps with focus, no AE's. The patient was counseled on the addictive nature of the medication and was encouraged to take drug holidays when not needed.   Chronic low back pain, unspecified back pain laterality, with sciatica presence unspecified       -     Chronic tailbone pain; managed by avoidance of pressure and medications as needed. Sees ortho.  - check CRP per patient pref  Migraine without aura and without status migrainosus, not intractable Recently improved post tooth extration, well managed by OTC analgesics PRN -     Magnesium  Moderate episode of recurrent major depressive disorder in remission [facility]) She reports off of medications and doing much better; remission; monitor; failed numerous in the past, most recently on pristiq which worked well  Lifestyle discussed: diet/exerise, sleep hygiene, stress management, hydration  Insomnia, unspecified type       -      Moderately well managed by current medications, ambien via Dr. Nori Riis and melatonin  Overweight- BMI 27 Long discussion about weight loss, diet, and exercise Recommended diet heavy in fruits and veggies and low in animal meats, cheeses, and dairy products, appropriate calorie intake Patient will work on exercise, particularly weight lifting and resistance exercise Excellent progress with mounjaro - sent in 15 mg/week dose High fiber  and protein diet, avoid processed carbs, add regular resistance exercises  Follow up at next visit  Vitamin D deficiency -     VITAMIN D 25 Hydroxy (Vit-D Deficiency, Fractures)  Allergic state, sequela      -      Well managed by current medications  Nocturnal leg cramps ABI was normal, CBC, iron has been normal  Doing well with requip/magnesium/heat; increase walking  Medication management -     CBC with Differential/Platelet -     CMP/GFR  Screening for cardiovascular condition -     Lipid panel -     EKG 12-Lead - defer  Screening for hematuria or proteinuria -     Urinalysis, Complete (81001)  Screening for thyroid disorder -     TSH  Screening for diabetes mellitus -     Hemoglobin A1c - defer after shared med decision  Chronic kidney disease (CKD), stage II (mild) -     COMPLETE METABOLIC PANEL WITH GFR  Chronic constipation Improved with "calm" magnesium Encouraged high fiber diet, hydration, regular exercise    Orders Placed This Encounter  Procedures   CBC with Differential/Platelet   COMPLETE METABOLIC PANEL WITH GFR   Magnesium   Lipid panel   TSH   VITAMIN D 25 Hydroxy (Vit-D Deficiency, Fractures)   Vitamin B12   Microalbumin / creatinine urine ratio   Urinalysis, Routine w reflex microscopic   C-reactive protein     Discussed med's effects and SE's. Screening labs and tests as requested with regular follow-up as recommended. Over 40 minutes of exam, counseling, chart review, and complex, high level critical decision making was  performed this visit.   Future Appointments  Date Time Provider [place]  09/08/2022  3:00 PM Liane Comber, NP [facility] None    HPI  56 y.o. married Caucasian female  presents for a complete physical and follow up for has Insomnia; Vitamin D deficiency; ADD (attention deficit disorder); Migraines; Allergy; Chronic lumbar pain; Nocturnal leg cramps; Recurrent major depression in remission ([place]); Overweight  (BMI 25.0-29.9); Osteopenia; Mixed hyperlipidemia; Chronic constipation; B12 deficiency; and Hyperinsulinemia on their problem list..   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 Enjoys spending time with grandkids.   Follows with GYN - Dr. [name] - for PAPs. He is prescribing estradiol and progesterone. She is on bASA. Gets mammograms at breast center.   She had fall with R shoulder injury, repeated dislocations, underwent arthroscopic repair 05/30/2020 by Dr. Tamera Punt, completed PT and essentially resolved.   Hx of depression in remission, did well on pristiq at one point.  Patient is on an ADD medication, adderall 20 mg on work days PRN, denies SE and works when needed. Currently taking prolonged break.   She has dx of moderate depression, has failed many agents, lexapro, zoloft, wellbutrin, did well with pristiq for 18 months but stopped and reprots is actually doing fairly with just her night time sleeping medication (ambien 5 mg via Dr. [name]).   She reports she has been followed by ortho for cervical facet issues that seem to be contributing to "migraines" - tension headaches, triggered by stress, sinus. Takes tylenol, sinus med PRN, occasional nurtec samples.   She has chronic intermittent lumbar pain attributed to sedentary job, ortho follows. Also restless legs, "calm" magnesium gummies have helped. Heating blanket at night. Takes requip 2 mg in the evening if needed.   BMI is Body mass index is 27.08 kg/m., she has been working on diet; she is active with her grandkids. Weight has been an ongoing struggle. She has failed phentermine/topamax., did get on wegovy with samples last year with good success though with insurance coverage issues. She has been on mounjaro with coupon, now on 12.5 mg, tolerates well and down over 20 lb on home scale, takes magnesium supplement for constipation.  Wt Readings from Last 3 Encounters:   09/08/21 159 lb (72.1 kg)  08/27/20 171 lb (77.6 kg)  05/23/20 169 lb 15.6 oz (77.1 kg)   Today their BP is BP: 98/64   She does workout. She denies chest pain, shortness of breath, dizziness.   She is not on cholesterol medication. Her cholesterol is not at goal. The cholesterol last visit was:   Lab Results  Component Value Date   CHOL 167 08/27/2020   HDL 43 (L) 08/27/2020   LDLCALC 102 (H) 08/27/2020   TRIG 128 08/27/2020   CHOLHDL 3.9 08/27/2020    Last A1C in the office was:  Lab Results  Component Value Date   HGBA1C 4.9 08/27/2020   Last GFR: Lab Results  Component Value Date   GFRNONAA 79 08/27/2020   Patient is on Vitamin D supplement and at goal, taking reduced dose vit D 2000 IU with K2 combo.  Lab Results  Component Value Date   VD25OH 116 (H) 08/27/2020     She reports is not currently on a supplement -  Lab Results  Component Value Date   VITAMINB12 280 08/27/2020      Current Medications:  Current Outpatient Medications on File Prior to Visit  Medication Sig Dispense  Refill   Cholecalciferol (VITAMIN D PO) Take 10,000 Units by mouth.      estradiol (VIVELLE-DOT) 0.1 MG/24HR patch Place 1 patch onto the skin once a week.     PROGESTERONE PO Take 100 mg by mouth daily.     rOPINIRole (REQUIP) 1 MG tablet Take      1 to 2 tablets     at Bedtime        for Restless Legs (Patient taking differently: as needed. Take 1 to 2 tablets at Bedtime for Restless Legs) 180 tablet 0   zolpidem (AMBIEN) 5 MG tablet Take 5 mg by mouth at bedtime as needed.     amphetamine-dextroamphetamine (ADDERALL) 20 MG tablet Take 1 tab Daily as needed for ADD, Focus & Concentration - try  limit to 5 days a week or less to avoid addiction /tolerance. (Patient not taking: Reported on 09/08/2021) 30 tablet 0   No current facility-administered medications on file prior to visit.   Allergies:  Allergies  Allergen Reactions   Percocet [Oxycodone-Acetaminophen]    Medical History:   She has Insomnia; Vitamin D deficiency; ADD (attention deficit disorder); Migraines; Allergy; Chronic lumbar pain; Nocturnal leg cramps; Recurrent major depression in remission ([place]); Overweight (BMI 25.0-29.9); Osteopenia; Mixed hyperlipidemia; Chronic constipation; B12 deficiency; and Hyperinsulinemia on their problem list.  Health Maintenance:   Immunization History  Administered Date(s) Administered   Influenza Inj Mdck Quad With Preservative 05/16/2018, 05/22/2019, 07/07/2020   Influenza Split 05/09/2014   Influenza,inj,Quad PF,6+ Mos 05/31/2021   Influenza-Unspecified 05/02/2013   PFIZER(Purple Top)SARS-COV-2 Vaccination 10/02/2019, 10/25/2019   Pneumococcal-Unspecified 08/15/1997   Td 08/15/2006   Tdap 09/15/2012   Health Maintenance  Topic Date Due   Zoster Vaccines- Shingrix (1 of 2) Never done   PAP SMEAR-Modifier  03/02/2018   COVID-19 Vaccine (3 - Pfizer risk series) 09/24/2021 (Originally 11/22/2019)   MAMMOGRAM  05/07/2022   TETANUS/TDAP  09/15/2022   COLONOSCOPY (Pts 45-3yrs Insurance coverage will need to be confirmed)  03/23/2023   DEXA SCAN  05/08/2023   INFLUENZA VACCINE  Completed   HPV VACCINES  Aged Out   Hepatitis C Screening  Discontinued   HIV Screening  Discontinued    LMP: Patient has had an ablation. Pap: ? 02/2020 norm, Dr. Juan Quam office annually  MGM: getting at breast center DEXA: getting q2y, osteopenia, R fem t-1.6  Last Dental Exam: 2022, goes q42m, Dr. Christie Nottingham  Last Eye Exam: 2022 Dr. Payton Emerald office, usually sees Dr. Delman Cheadle Skin exam: 2019, Dr. Ubaldo Glassing, no concerns  Patient Care Team: Unk Pinto, MD as PCP - General (Internal Medicine) Liane Comber, NP as Nurse Practitioner (Nurse Practitioner) Delfina Redwood (Physician Assistant)  Surgical History:  She has a past surgical history that includes Gynecologic cryosurgery; Novasure ablation; Oophorectomy (Right); Breast enhancement surgery (Bilateral, 1991); Parotidectomy  (Left, 09/2008); Breast biopsy (Left, 2018); Tooth extraction (Right, 05/24/2018); Augmentation mammaplasty (Bilateral); Shoulder adhesion release (Right, 01/14/2016); Shoulder adhesion release (Left, 2012); and Shoulder arthroscopy (Right, 05/30/2020). Family History:  Herfamily history includes Breast cancer in her maternal aunt, maternal grandmother, and paternal aunt; Cancer in her father; Depression in her mother; Diabetes in her brother; Heart disease in her father, maternal grandfather, and paternal grandfather; Hypertension in her father; Stroke in her paternal grandfather; Suicidality in her paternal grandmother; Tongue cancer in her father. Social History:  She reports that she has never smoked. She has never used smokeless tobacco. She reports current alcohol use. She reports that she does not use drugs.  Review of Systems: Review of Systems  Constitutional:  Negative for chills, diaphoresis, fever, malaise/fatigue and weight loss.  HENT:  Negative for ear pain, hearing loss and tinnitus.   Eyes:  Negative for blurred vision, double vision, photophobia and pain.  Respiratory:  Negative for cough, sputum production, shortness of breath and wheezing.   Cardiovascular:  Negative for chest pain, palpitations, orthopnea, claudication and leg swelling.  Gastrointestinal:  Negative for abdominal pain, blood in stool, constipation (improved with magnesium), diarrhea, heartburn, melena, nausea and vomiting.  Genitourinary: Negative.  Negative for dysuria and urgency.  Musculoskeletal:  Negative for back pain (intermittent lower back/sacral pain), joint pain, myalgias and neck pain.  Skin:  Negative for rash.  Neurological:  Negative for dizziness, tingling, sensory change, weakness and headaches.  Endo/Heme/Allergies:  Positive for environmental allergies. Negative for polydipsia.  Psychiatric/Behavioral:  Negative for depression, memory loss, substance abuse and suicidal ideas. The patient is  not nervous/anxious and does not have insomnia (managed with med from GYN).   All other systems reviewed and are negative.  Physical Exam: Estimated body mass index is 27.08 kg/m as calculated from the following:   Height as of this encounter: 5' 4.25" (1.632 m).   Weight as of this encounter: 159 lb (72.1 kg). BP 98/64    Pulse 88    Temp 97.9 F (36.6 C)    Ht 5' 4.25" (1.632 m)    Wt 159 lb (72.1 kg)    SpO2 99%    BMI 27.08 kg/m  General Appearance: Well nourished, in no apparent distress.  Eyes: PERRLA, EOMs, conjunctiva no swelling or erythema Sinuses: No Frontal/maxillary tenderness  ENT/Mouth: Ext aud canals clear, normal light reflex with TMs without erythema, bulging. Good dentition. No erythema, swelling, or exudate on post pharynx. Tonsils not swollen or erythematous. Hearing normal.  Neck: Supple, thyroid normal. No bruits  Respiratory: Respiratory effort normal, BS equal bilaterally without rales, rhonchi, wheezing or stridor.  Cardio: RRR without murmurs, rubs or gallops. Distal pulses symmetrical and intact.  Chest: symmetric, with normal excursions and percussion.  Breasts: Defer to GYN, mmg annually Abdomen: Soft, BS x 4, no tenderness, no guarding, rebound, hernias, masses, or organomegaly.  Lymphatics: Non tender without lymphadenopathy.  Genitourinary: Defer to GYN Musculoskeletal: Full ROM all peripheral extremities,5/5 strength, and normal gait.  Skin: Warm, dry without rashes, ecchymosis, lesions.  Neuro: Cranial nerves intact, reflexes diminished throughout though equal bilaterally. Normal muscle tone, no cerebellar symptoms. Sensation intact.  Psych: Awake and oriented X 3, normal affect, Insight and Judgment appropriate.   EKG: WNL no ST changes in 2020, low risk, defer  Carla Ribas, NP 5:01 PM [facility]

## 2021-09-08 NOTE — Patient Instructions (Addendum)
Goals      Exercise 150 min/wk Moderate Activity     Incorporate weights/resistance exercises routinely       Know what a healthy weight is for you (roughly BMI <25) and aim to maintain this  Aim for 7+ servings of fruits and vegetables daily  65-80+ fluid ounces of water or unsweet tea for healthy kidneys  Limit to max 1 drink of alcohol per day; avoid smoking/tobacco  Limit animal fats in diet for cholesterol and heart health - choose grass fed whenever available  Avoid highly processed foods, and foods high in saturated/trans fats  Aim for low stress - take time to unwind and care for your mental health  Aim for 150 min of moderate intensity exercise weekly for heart health, and weights twice weekly for bone health  Aim for 7-9 hours of sleep daily    Eating Plan for Osteoporosis Osteoporosis causes your bones to become weak and brittle. This puts you at greater risk for bone breaks (fractures) from small bumps or falls. Making changes to your diet and increasing your physical activity can help strengthen your bones and improve your overall health. Calcium and vitamin D are nutrients that play an important role in bone health. Vitamin D helps your body use calcium and strengthen bones. It is important to get enough calcium and vitamin D as part of your eating plan for osteoporosis. What are tips for following this plan? Reading food labels Try to get at least 1,000 milligrams (mg) of calcium each day. Look for foods that have at least 50 mg of calcium per serving. Talk with your health care provider about taking a calcium supplement if you do not get enough calcium from food. Do not have more than 2,500 mg of calcium each day. This is the upper limit for food and nutritional supplements combined. Too much calcium may cause constipation and prevent you from absorbing other important nutrients. Choose foods that contain vitamin D. Take a daily vitamin supplement that contains  800-1,000 international units (IU) of vitamin D. The amount may be different depending on your age, body weight, and where you live. Talk with your dietitian or health care provider about how much vitamin D is right for you. Avoid foods that have more than 300 mg of sodium per serving. Too much sodium can cause your body to lose calcium. Talk with your dietitian or health care provider about how much sodium you are allowed each day. Shopping Do not buy foods with added salt, including: Salted snacks. Angie Fava. Canned soups. Canned meats. Processed meats, such as bacon or precooked or cured meat like sausages or meat loaves. Smoked fish. Meal planning Eat balanced meals that contain protein foods, fruits and vegetables, and foods rich in calcium and vitamin D. Eat at least 5 servings of fruits and vegetables each day. Eat 5-6 oz (142-170 g) of lean meat, poultry, fish, eggs, or beans each day. Lifestyle Do not use any products that contain nicotine or tobacco, such as cigarettes, e-cigarettes, and chewing tobacco. If you need help quitting, ask your health care provider. If your health care provider recommends that you lose weight: Work with a dietitian to develop an eating plan that will help you reach your desired weight goal. Exercise for at least 30 minutes a day, 5 or more days a week, or as told by your health care provider. Work with a physical therapist to develop an exercise plan that includes flexibility, balance, and strength exercises. Do not focus  only on aerobic exercise. Do not drink alcohol if: Your health care provider tells you not to drink. You are pregnant, may be pregnant, or are planning to become pregnant. If you drink alcohol: Limit how much you use to: 0-1 drink a day for women. 0-2 drinks a day for men. Be aware of how much alcohol is in your drink. In the [place] one drink equals one 12 oz bottle of beer (355 mL), one 5 oz glass of wine (148 mL), or one 1 oz glass  of hard liquor (44 mL). What foods should I eat? Foods high in calcium  Yogurt. Yogurt with fruit. Milk. Evaporated skim milk. Dry milk powder. Calcium-fortified orange juice. Parmesan cheese. Part-skim ricotta cheese. Natural hard cheese. Cream cheese. Cottage cheese. Canned sardines. Canned salmon. Calcium-treated tofu. Calcium-fortified cereal bar. Calcium-fortified cereal. Calcium-fortified graham crackers. Cooked collard greens. Turnip greens. Broccoli. Kale. Almonds. White beans. Corn tortilla. Foods high in vitamin D Cod liver oil. Fatty fish, such as tuna, mackerel, and salmon. Milk. Fortified soy milk. Fortified fruit juice. Yogurt. Margarine. Egg yolks. Foods high in protein Beef. Lamb. Pork tenderloin. Chicken breast. Tuna (canned). Fish fillet. Tofu. Cooked soy beans. Soy patty. Beans (canned or cooked). Cottage cheese. Yogurt. Peanut butter. Pumpkin seeds. Nuts. Sunflower seeds. Hard cheese. Milk or other milk products, such as soy milk. The items listed above may not be a complete list of foods and beverages you can eat. Contact a dietitian for more options. Summary Calcium and vitamin D are nutrients that play an important role in bone health and are an important part of your eating plan for osteoporosis. Eat balanced meals that contain protein foods, fruits and vegetables, and foods rich in calcium and vitamin D. Avoid foods that have more than 300 mg of sodium per serving. Too much sodium can cause your body to lose calcium. Exercise is an important part of prevention and treatment of osteoporosis. Aim for at least 30 minutes a day, 5 days a week. This information is not intended to replace advice given to you by your health care provider. Make sure you discuss any questions you have with your health care provider. Document Revised: 01/16/2020 Document Reviewed: 01/16/2020 Elsevier Patient Education  [place].     High-Fiber Eating Plan Fiber,  also called dietary fiber, is a type of carbohydrate. It is found foods such as fruits, vegetables, whole grains, and beans. A high-fiber diet can have many health benefits. Your health care provider may recommend a high-fiber diet to help: Prevent constipation. Fiber can make your bowel movements more regular. Lower your cholesterol. Relieve the following conditions: Inflammation of veins in the anus (hemorrhoids). Inflammation of specific areas of the digestive tract (uncomplicated diverticulosis). A problem of the large intestine, also called the colon, that sometimes causes pain and diarrhea (irritable bowel syndrome, or IBS). Prevent overeating as part of a weight-loss plan. Prevent heart disease, type 2 diabetes, and certain cancers. What are tips for following this plan? Reading food labels  Check the nutrition facts label on food products for the amount of dietary fiber. Choose foods that have 5 grams of fiber or more per serving. The goals for recommended daily fiber intake include: Men (age 68 or younger): 34-38 g. Men (over age 46): 28-34 g. Women (age 49 or younger): 25-28 g. Women (over age 39): 22-25 g. Your daily fiber goal is _____________ g. Shopping Choose whole fruits and vegetables instead of processed forms, such as apple juice or applesauce. Choose a  wide variety of high-fiber foods such as avocados, lentils, oats, and kidney beans. Read the nutrition facts label of the foods you choose. Be aware of foods with added fiber. These foods often have high sugar and sodium amounts per serving. Cooking Use whole-grain flour for baking and cooking. Cook with brown rice instead of white rice. Meal planning Start the day with a breakfast that is high in fiber, such as a cereal that contains 5 g of fiber or more per serving. Eat breads and cereals that are made with whole-grain flour instead of refined flour or white flour. Eat brown rice, bulgur wheat, or millet instead of  white rice. Use beans in place of meat in soups, salads, and pasta dishes. Be sure that half of the grains you eat each day are whole grains. General information You can get the recommended daily intake of dietary fiber by: Eating a variety of fruits, vegetables, grains, nuts, and beans. Taking a fiber supplement if you are not able to take in enough fiber in your diet. It is better to get fiber through food than from a supplement. Gradually increase how much fiber you consume. If you increase your intake of dietary fiber too quickly, you may have bloating, cramping, or gas. Drink plenty of water to help you digest fiber. Choose high-fiber snacks, such as berries, raw vegetables, nuts, and popcorn. What foods should I eat? Fruits Berries. Pears. Apples. Oranges. Avocado. Prunes and raisins. Dried figs. Vegetables Sweet potatoes. Spinach. Kale. Artichokes. Cabbage. Broccoli. Cauliflower. Green peas. Carrots. Squash. Grains Whole-grain breads. Multigrain cereal. Oats and oatmeal. Brown rice. Barley. Bulgur wheat. [place]. Quinoa. Bran muffins. Popcorn. Rye wafer crackers. Meats and other proteins Navy beans, kidney beans, and pinto beans. Soybeans. Split peas. Lentils. Nuts and seeds. Dairy Fiber-fortified yogurt. Beverages Fiber-fortified soy milk. Fiber-fortified orange juice. Other foods Fiber bars. The items listed above may not be a complete list of recommended foods and beverages. Contact a dietitian for more information. What foods should I avoid? Fruits Fruit juice. Cooked, strained fruit. Vegetables Fried potatoes. Canned vegetables. Well-cooked vegetables. Grains White bread. Pasta made with refined flour. White rice. Meats and other proteins Fatty cuts of meat. Fried chicken or fried fish. Dairy Milk. Yogurt. Cream cheese. Sour cream. Fats and oils Butters. Beverages Soft drinks. Other foods Cakes and pastries. The items listed above may not be a complete list of  foods and beverages to avoid. Talk with your dietitian about what choices are best for you. Summary Fiber is a type of carbohydrate. It is found in foods such as fruits, vegetables, whole grains, and beans. A high-fiber diet has many benefits. It can help to prevent constipation, lower blood cholesterol, aid weight loss, and reduce your risk of heart disease, diabetes, and certain cancers. Increase your intake of fiber gradually. Increasing fiber too quickly may cause cramping, bloating, and gas. Drink plenty of water while you increase the amount of fiber you consume. The best sources of fiber include whole fruits and vegetables, whole grains, nuts, seeds, and beans. This information is not intended to replace advice given to you by your health care provider. Make sure you discuss any questions you have with your health care provider. Document Revised: 12/05/2019 Document Reviewed: 12/05/2019 Elsevier Patient Education  2022 [employer].

## 2021-09-09 ENCOUNTER — Other Ambulatory Visit: Payer: Self-pay | Admitting: Nurse Practitioner

## 2021-09-09 DIAGNOSIS — R197 Diarrhea, unspecified: Secondary | ICD-10-CM

## 2021-09-09 LAB — CBC WITH DIFFERENTIAL/PLATELET
Absolute Monocytes: 532 cells/uL (ref 200–950)
Basophils Absolute: 38 cells/uL (ref 0–200)
Basophils Relative: 0.5 %
Eosinophils Absolute: 99 cells/uL (ref 15–500)
Eosinophils Relative: 1.3 %
HCT: 39 % (ref 35.0–45.0)
Hemoglobin: 13.3 g/dL (ref 11.7–15.5)
Lymphs Abs: 2918 cells/uL (ref 850–3900)
MCH: 30.1 pg (ref 27.0–33.0)
MCHC: 34.1 g/dL (ref 32.0–36.0)
MCV: 88.2 fL (ref 80.0–100.0)
MPV: 9.3 fL (ref 7.5–12.5)
Monocytes Relative: 7 %
Neutro Abs: 4013 cells/uL (ref 1500–7800)
Neutrophils Relative %: 52.8 %
Platelets: 441 10*3/uL — ABNORMAL HIGH (ref 140–400)
RBC: 4.42 10*6/uL (ref 3.80–5.10)
RDW: 12.5 % (ref 11.0–15.0)
Total Lymphocyte: 38.4 %
WBC: 7.6 10*3/uL (ref 3.8–10.8)

## 2021-09-09 LAB — COMPLETE METABOLIC PANEL WITH GFR
AG Ratio: 2.2 (calc) (ref 1.0–2.5)
ALT: 10 U/L (ref 6–29)
AST: 11 U/L (ref 10–35)
Albumin: 4.7 g/dL (ref 3.6–5.1)
Alkaline phosphatase (APISO): 59 U/L (ref 37–153)
BUN: 12 mg/dL (ref 7–25)
CO2: 32 mmol/L (ref 20–32)
Calcium: 9.9 mg/dL (ref 8.6–10.4)
Chloride: 103 mmol/L (ref 98–110)
Creat: 0.81 mg/dL (ref 0.50–1.03)
Globulin: 2.1 g/dL (calc) (ref 1.9–3.7)
Glucose, Bld: 74 mg/dL (ref 65–99)
Potassium: 4.2 mmol/L (ref 3.5–5.3)
Sodium: 140 mmol/L (ref 135–146)
Total Bilirubin: 0.6 mg/dL (ref 0.2–1.2)
Total Protein: 6.8 g/dL (ref 6.1–8.1)
eGFR: 85 mL/min/{1.73_m2} (ref >=60)

## 2021-09-09 LAB — MICROALBUMIN / CREATININE URINE RATIO
Creatinine, Urine: 23 mg/dL (ref 20–275)
Microalb, Ur: <0.2 mg/dL

## 2021-09-09 LAB — URINALYSIS, ROUTINE W REFLEX MICROSCOPIC
Bilirubin Urine: NEGATIVE
Glucose, UA: NEGATIVE
Hgb urine dipstick: NEGATIVE
Ketones, ur: NEGATIVE
Leukocytes,Ua: NEGATIVE
Nitrite: NEGATIVE
Protein, ur: NEGATIVE
Specific Gravity, Urine: 1.003 (ref 1.001–1.035)
pH: 6.5 (ref 5.0–8.0)

## 2021-09-09 LAB — VITAMIN D 25 HYDROXY (VIT D DEFICIENCY, FRACTURES): Vit D, 25-Hydroxy: 57 ng/mL (ref 30–100)

## 2021-09-09 LAB — LIPID PANEL
Cholesterol: 201 mg/dL — ABNORMAL HIGH (ref <200)
HDL: 42 mg/dL — ABNORMAL LOW (ref >=50)
LDL Cholesterol (Calc): 142 mg/dL (calc) — ABNORMAL HIGH
Non-HDL Cholesterol (Calc): 159 mg/dL (calc) — ABNORMAL HIGH (ref <130)
Total CHOL/HDL Ratio: 4.8 (calc) (ref <5.0)
Triglycerides: 77 mg/dL (ref <150)

## 2021-09-09 LAB — MAGNESIUM: Magnesium: 2.2 mg/dL (ref 1.5–2.5)

## 2021-09-09 LAB — TSH: TSH: 2.28 mIU/L (ref 0.40–4.50)

## 2021-09-09 LAB — C-REACTIVE PROTEIN: CRP: 5.1 mg/L (ref <8.0)

## 2021-09-09 LAB — VITAMIN B12: Vitamin B-12: 444 pg/mL (ref 200–1100)

## 2021-11-14 IMAGING — MG DIGITAL SCREENING BREAST BILAT IMPLANT W/ TOMO W/ CAD
8 of 14 series · 8 of 34 positions shown · non-contrast
Comparison: Previous exam(s).

CLINICAL DATA: Screening.

EXAM:
DIGITAL SCREENING BILATERAL MAMMOGRAM WITH IMPLANTS, CAD AND
TOMOSYNTHESIS
TECHNIQUE: Bilateral screening digital craniocaudal and mediolateral oblique
mammograms were obtained. Bilateral screening digital breast
tomosynthesis was performed. The images were evaluated with
computer-aided detection. Standard and/or implant displaced views
were performed.

[R CC]
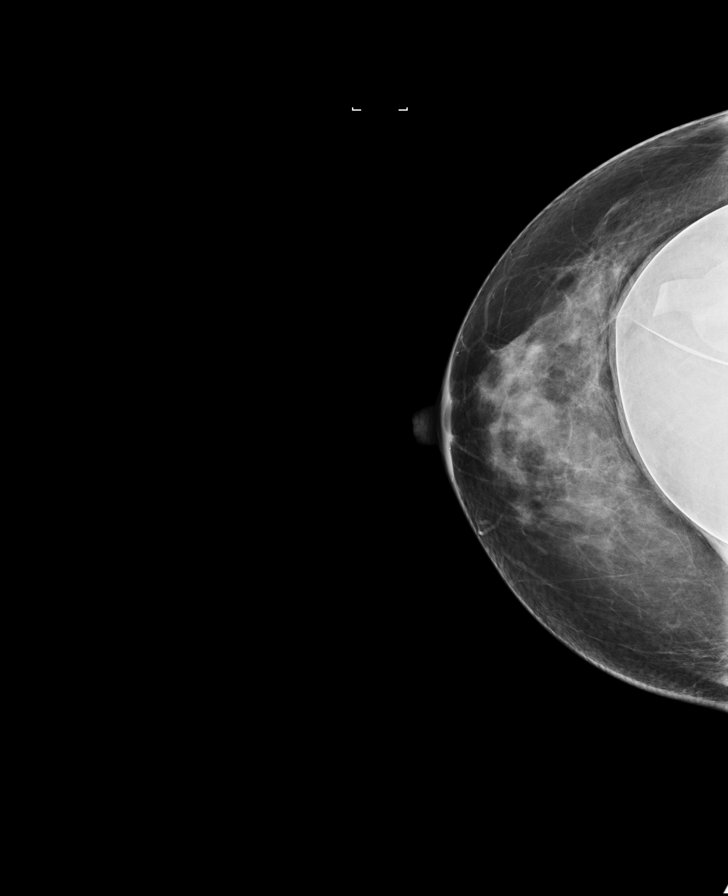

[R MLO]
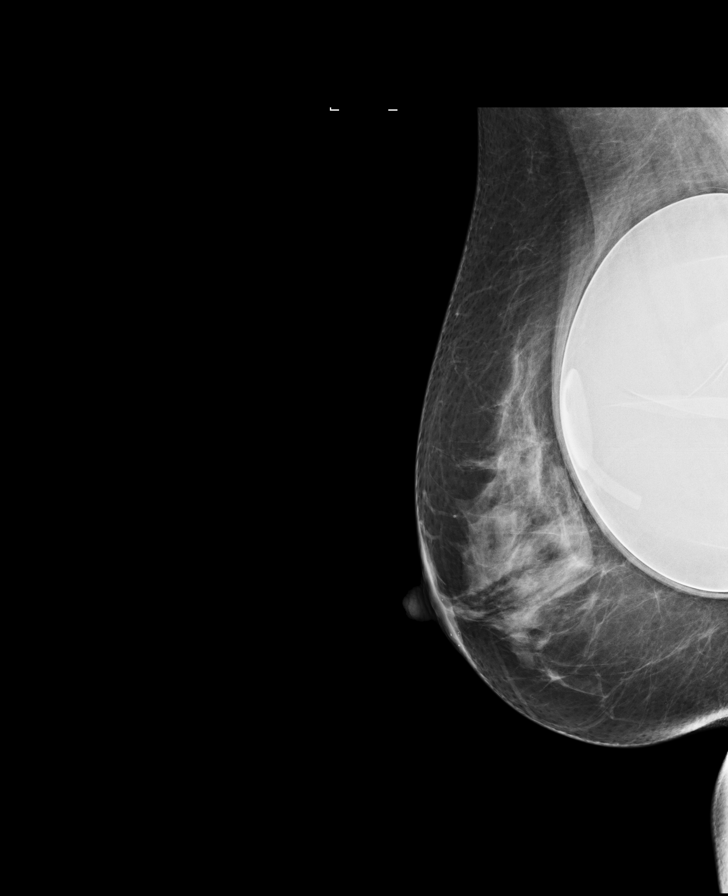

[L MLO]
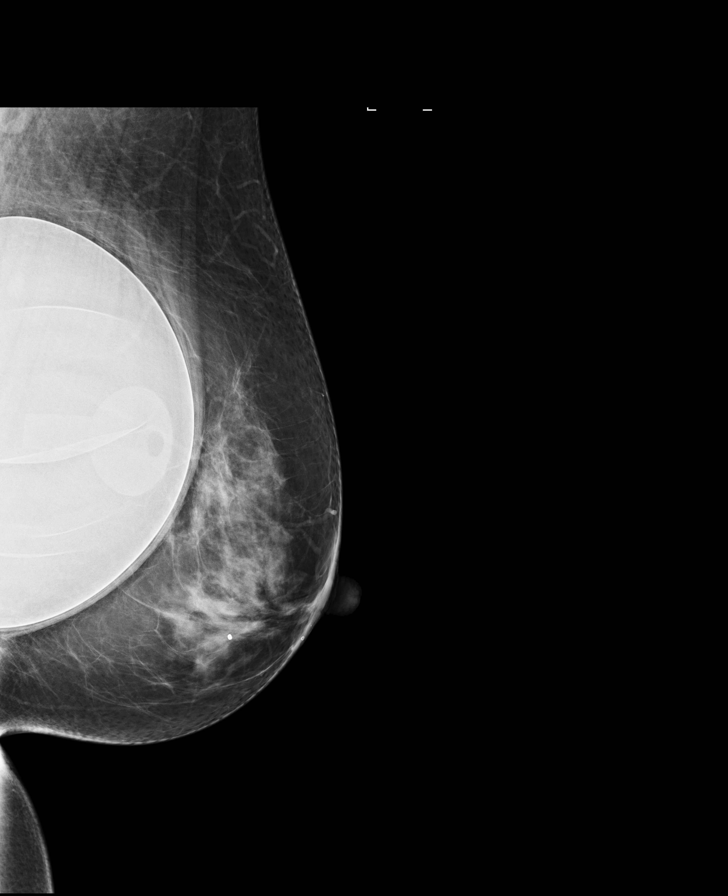

[L CC]
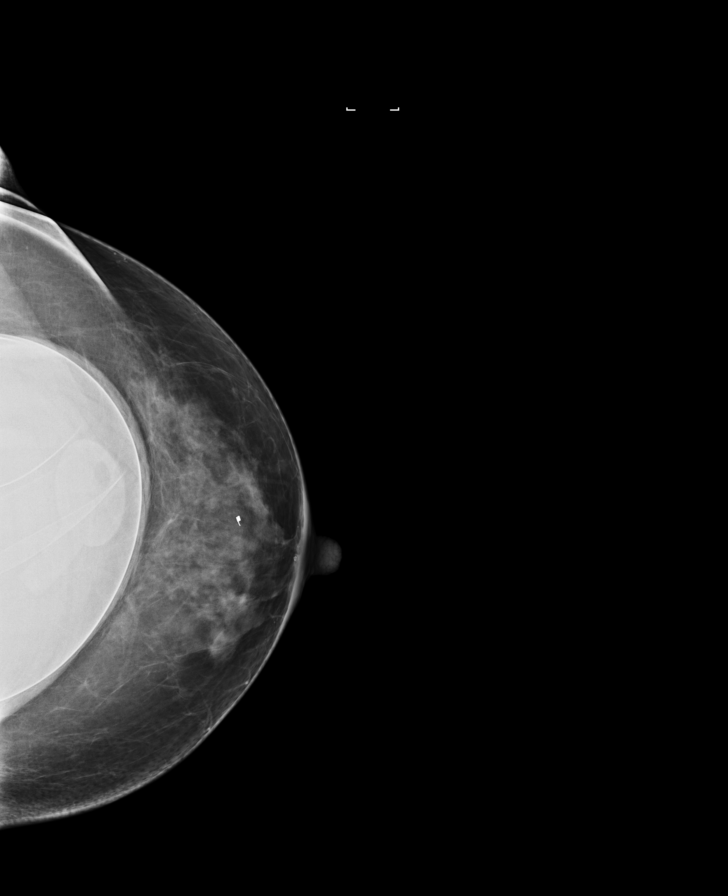

[L MLO synth-2D (1 of 2)]
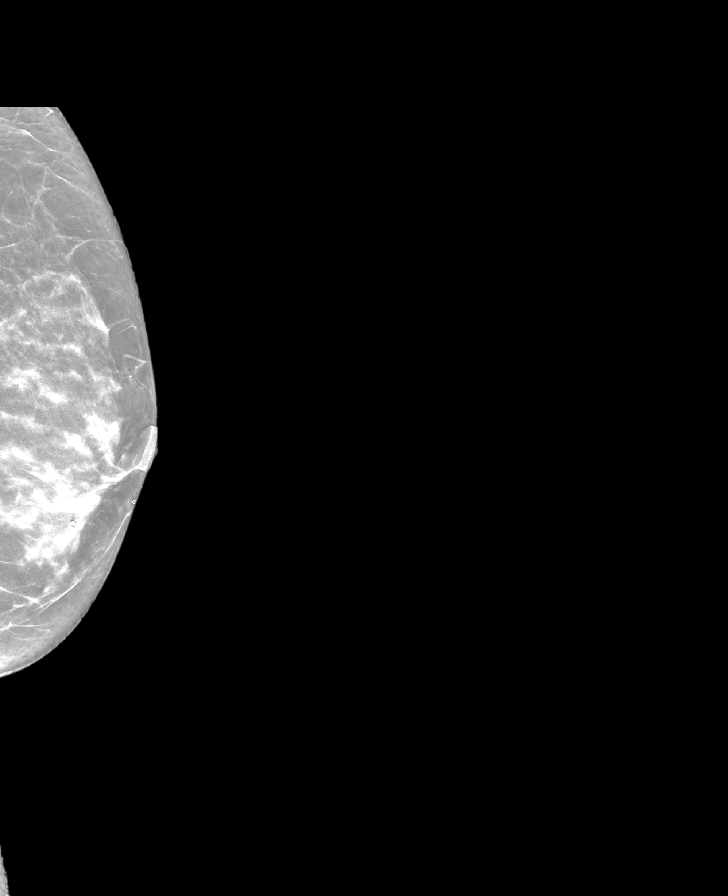

[R CC synth-2D]
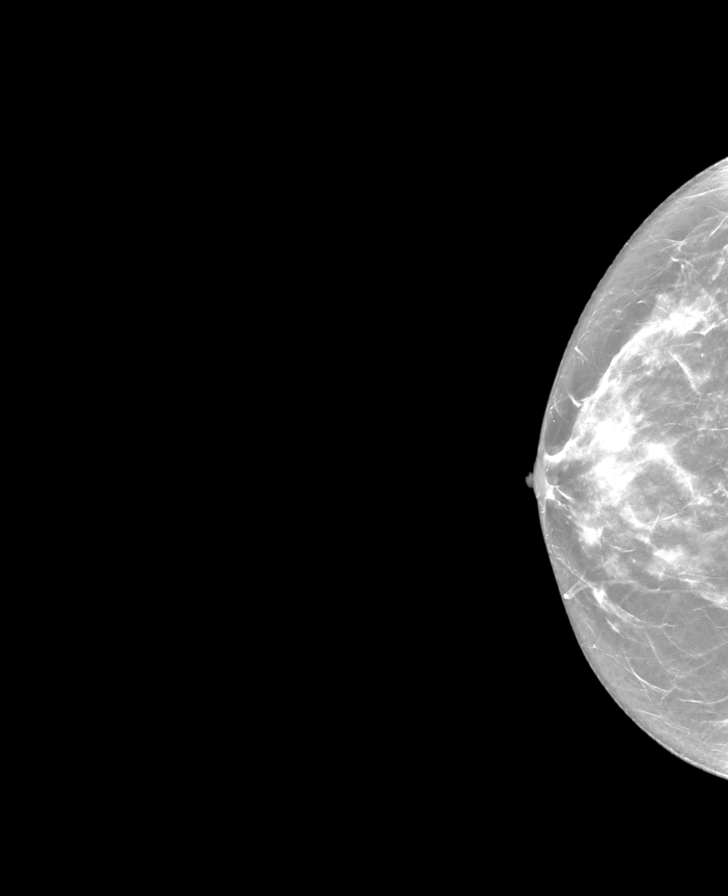

[R MLO synth-2D]
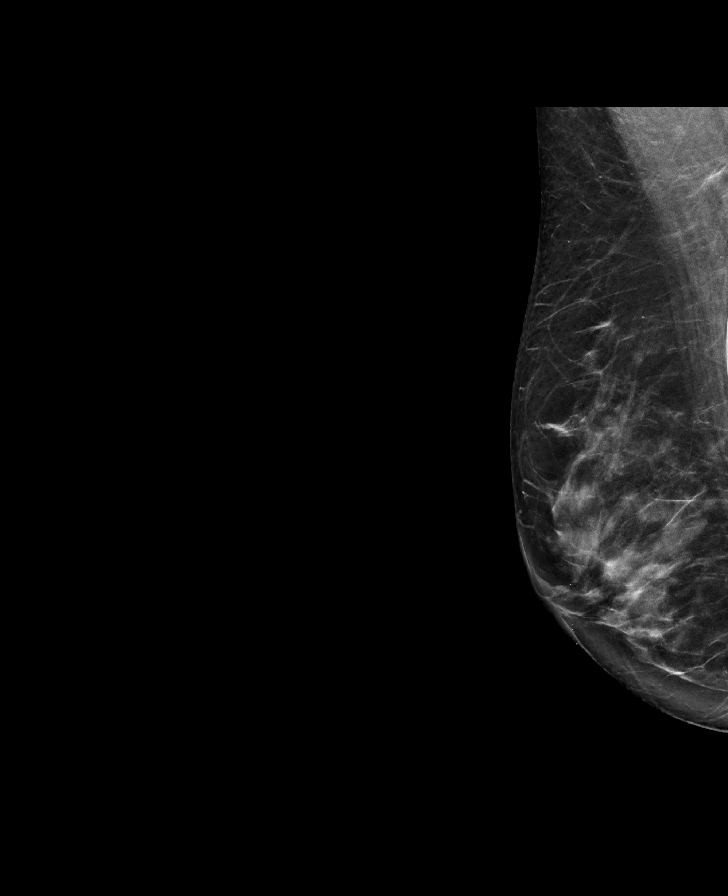

[L MLO synth-2D (2 of 2)]
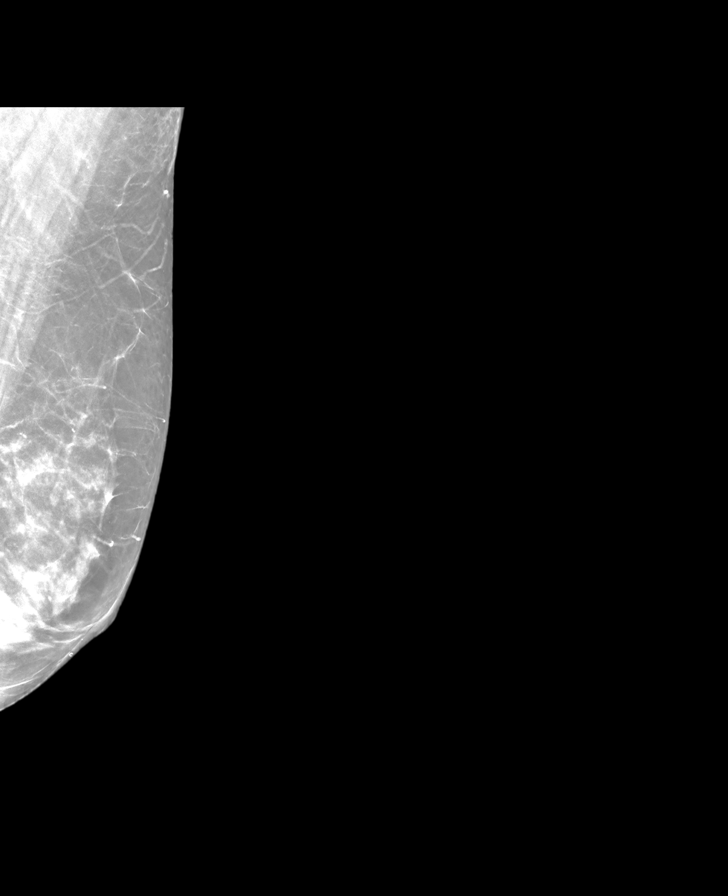

[8 of 34 positions shown; findings below may reference images not displayed]

ACR Breast Density Category c: The breast tissue is heterogeneously
dense, which may obscure small masses.
FINDINGS: The patient has retropectoral implants. There are no findings
suspicious for malignancy.
IMPRESSION: No mammographic evidence of malignancy. A result letter of this
screening mammogram will be mailed directly to the patient.

RECOMMENDATION:
Screening mammogram in one year. (Code:LT-E-7TH)

BI-RADS CATEGORY  1:  Negative.

## 2021-12-01 ENCOUNTER — Ambulatory Visit (INDEPENDENT_AMBULATORY_CARE_PROVIDER_SITE_OTHER): Payer: 59 | Admitting: Adult Health

## 2021-12-01 ENCOUNTER — Encounter: Payer: Self-pay | Admitting: Adult Health

## 2021-12-01 VITALS — BP 120/62 | HR 79 | Temp 97.9°F | Wt 150.0 lb

## 2021-12-01 DIAGNOSIS — E663 Overweight: Secondary | ICD-10-CM

## 2021-12-01 DIAGNOSIS — M7072 Other bursitis of hip, left hip: Secondary | ICD-10-CM | POA: Diagnosis not present

## 2021-12-01 MED ORDER — DEXAMETHASONE SODIUM PHOSPHATE 100 MG/10ML IJ SOLN: 10.0000 mg | Freq: Once | INTRAMUSCULAR | Status: DC

## 2021-12-01 NOTE — Progress Notes (Signed)
Assessment and Plan: ? ?Carla Fuller was seen today for hip pain. ? ?Diagnoses and all orders for this visit: ? ?Bursitis of left hip, unspecified bursa ?Injected after discussion and verbal consent; prepped with alcohol and betadine; injected with 1% lidocaine and 10 mg of dexamethasone with immediate relief,  ?Tolerated well  ?Given information and exercises ?Will also refer to PT per her request ?Follow up as needed ?-     Ambulatory referral to Physical Therapy ?-     10 mg dexamethasone  ? ?Overweight (BMI 25.0-29.9) ?Commended excellent progress! ?Long discussion about weight loss, diet, and exercise ?Recommended diet heavy in fruits and veggies and low in animal meats, cheeses, and dairy products, appropriate calorie intake ?Discussed appropriate weight for height  ?Follow up at next visit ? ? ?Further disposition pending results of labs. Discussed med's effects and SE's.   ?Over 20 minutes of exam, counseling, chart review, and critical decision making was performed.  ? ?Future Appointments  ?Date Time Provider [place]  ?02/17/2022  8:45 AM Liane Comber, NP [facility] None  ?09/08/2022  3:00 PM Liane Comber, NP [facility] None  ? ? ?------------------------------------------------------------------------------------------------------------------ ? ? ?HPI ?BP 120/62   Pulse 79   Temp 97.9 ?F (36.6 ?C)   Wt 150 lb (68 kg)   SpO2 99%   BMI 25.55 kg/m?  ?56 y.o.female presents for evaluation of L hip pain.  ? ?She reports 2-3 weeks of left lateral and posterior hip pain /tenderness (hurts to roll over at night), intermittent sense of catching and weakness with sitting to standing, pain and sense of tightness/pulling down her lateral leg, some tightness in gluteal area. No particular injury prior to onset. Hx of similar in same side, received injection from ortho that helped. Has been icing, stretching and taking meloxicam 15 mg for over 1 week without notable improvement.  ? ?BMI is Body mass index  is 25.55 kg/m?., she has been working on diet and exercise. She continues to make excellent progress with weight loss, down 21 lb since Jan 2023 on mounjaro 15 mg weekly.  ?Wt Readings from Last 3 Encounters:  ?12/01/21 150 lb (68 kg)  ?09/08/21 159 lb (72.1 kg)  ?08/27/20 171 lb (77.6 kg)  ? ? ? ?Past Medical History:  ?Diagnosis Date  ? Abnormal cells of cervix   ? PAP  ? ADD (attention deficit disorder)   ? Allergy   ? Anemia   ? Anxiety   ? 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 Chronic lumbar pain   ? Sacrum  ? Insomnia   ? Migraines   ? Plantar fasciitis of right foot 06/21/2018  ? Unspecified vitamin D deficiency   ?  ? ?Allergies  ?Allergen Reactions  ? Percocet [Oxycodone-Acetaminophen]   ? ? ?Current Outpatient Medications on File Prior to Visit  ?Medication Sig  ? amphetamine-dextroamphetamine (ADDERALL) 20 MG tablet Take 1 tab Daily as needed for ADD, Focus & Concentration - try  limit to 5 days a week or less to avoid addiction /tolerance.  ? Cholecalciferol (VITAMIN D PO) Take 10,000 Units by mouth.   ? estradiol (VIVELLE-DOT) 0.1 MG/24HR patch Place 1 patch onto the skin once a week.  ? PROGESTERONE PO Take 100 mg by mouth daily.  ? rOPINIRole (REQUIP) 1 MG tablet Take      1 to 2 tablets     at Bedtime        for Restless Legs (Patient taking differently: as needed. Take 1 to  2 tablets at Bedtime for Restless Legs)  ? tirzepatide (MOUNJARO) 15 MG/0.5ML Pen Inject 15 mg into the skin once a week.  ? zolpidem (AMBIEN) 5 MG tablet Take 5 mg by mouth at bedtime as needed.  ? ?No current facility-administered medications on file prior to visit.  ? ? ?ROS: all negative except above.  ? ?Physical Exam: ? ?BP 120/62   Pulse 79   Temp 97.9 ?F (36.6 ?C)   Wt 150 lb (68 kg)   SpO2 99%   BMI 25.55 kg/m?  ? ?General Appearance: Well nourished, in no apparent distress. ?Eyes: PERRLA, conjunctiva no swelling or erythema ?ENT/Mouth: mask in place; Hearing normal.  ?Neck: Supple, thyroid  normal.  ?Respiratory: Respiratory effort normal, BS equal bilaterally without rales, rhonchi, wheezing or stridor.  ?Cardio: RRR with no MRGs. Brisk peripheral pulses without edema.  ?Abdomen: Soft, + BS.  Non tender, no guarding, rebound, hernias, masses. ?Lymphatics: Non tender without lymphadenopathy.  ?Musculoskeletal: no obvious deformity; intially with mildly antalgic gait. Bil hips with full ROM, no crepitus, catching. She has L hip lateral tenderness over greater trochanter with tenderness extending down IT to lateral calf. Neg straight leg raise. No SI tenderness.  ?Skin: Warm, dry without rashes, lesions, ecchymosis.  ?Neuro: Normal muscle tone ?Psych: Awake and oriented X 3, normal affect, Insight and Judgment appropriate.  ? ?Izora Ribas, NP ?9:57 AM ?[facility] ? ?

## 2021-12-07 ENCOUNTER — Other Ambulatory Visit: Payer: Self-pay | Admitting: Adult Health

## 2021-12-09 ENCOUNTER — Other Ambulatory Visit: Payer: Self-pay | Admitting: Internal Medicine

## 2021-12-09 DIAGNOSIS — E119 Type 2 diabetes mellitus without complications: Secondary | ICD-10-CM

## 2021-12-09 MED ORDER — MOUNJARO 15 MG/0.5ML ~~LOC~~ SOAJ: 15.0000 mg | SUBCUTANEOUS | 3 refills | Status: DC

## 2021-12-09 MED ORDER — METFORMIN HCL ER 500 MG PO TB24: ORAL_TABLET | ORAL | 3 refills | Status: DC

## 2022-01-17 ENCOUNTER — Other Ambulatory Visit: Payer: Self-pay | Admitting: Nurse Practitioner

## 2022-01-17 DIAGNOSIS — E119 Type 2 diabetes mellitus without complications: Secondary | ICD-10-CM

## 2022-01-17 MED ORDER — TIRZEPATIDE 12.5 MG/0.5ML ~~LOC~~ SOAJ: 12.5000 mg | SUBCUTANEOUS | 3 refills | Status: DC

## 2022-01-21 ENCOUNTER — Encounter: Payer: Self-pay | Admitting: Adult Health

## 2022-01-21 ENCOUNTER — Other Ambulatory Visit: Payer: Self-pay | Admitting: Nurse Practitioner

## 2022-01-21 DIAGNOSIS — M7072 Other bursitis of hip, left hip: Secondary | ICD-10-CM

## 2022-01-21 MED ORDER — LIDOCAINE 5 % EX PTCH: MEDICATED_PATCH | CUTANEOUS | 0 refills | Status: DC

## 2022-02-16 ENCOUNTER — Other Ambulatory Visit: Payer: Self-pay | Admitting: Nurse Practitioner

## 2022-02-16 DIAGNOSIS — Q829 Congenital malformation of skin, unspecified: Secondary | ICD-10-CM

## 2022-02-17 ENCOUNTER — Ambulatory Visit: Payer: 59 | Admitting: Adult Health

## 2022-02-23 NOTE — Progress Notes (Unsigned)
FOLLOW UP  Assessment and Plan:   Carla Fuller was seen today for follow-up.  Diagnoses and all orders for this visit:  Migraine without aura and without status migrainosus, not intractable Stress trigger; nurtec works well if needed Stress management strategies reviewed   Recurrent major depression in remission ([place]) In remission off of medications; restart pristiq if needed Lifestyle discussed: diet/exerise, sleep hygiene, stress management, hydration  Attention deficit disorder (ADD) without hyperactivity Currently off of adderall; taking extended break and managing per patient Working on lifestyle  Insomnia, unspecified type Ambien PRN via GYN  Hyperinsulinemia Working on lifestyle/weightloss -     COMPLETE METABOLIC PANEL WITH GFR  Mixed hyperlipidemia Atypical elevation last check, working on lifestyle Continue low cholesterol diet and exercise.  Check lipid panel.  -     COMPLETE METABOLIC PANEL WITH GFR -     Lipid panel  Vitamin D deficiency  BMI 24.0-24.9, adult Congratulated excellent progress - normal BMI Long discussion about weight loss, diet, and exercise Plans to start standing desk Recommended diet with adequate protein, high fiber Discussed bulk eating strategies Has been on mounjaro with good results, trying to taper down  Could try cinnamon 1000 mg/Berberine 500 mg supplements, titrate up to TID with meals if tolerated;  Follow up at next visit   Continue diet and meds as discussed. Further disposition pending results of labs. Discussed med's effects and SE's.   Over 30 minutes of exam, counseling, chart review, and critical decision making was performed.   Future Appointments  Date Time Provider [place]  09/08/2022  3:00 PM Darrol Jump, NP [facility] None    ----------------------------------------------------------------------------------------------------------------------  HPI 56 y.o. female  presents for 6 month follow up on  cholesterol, mood, weight and vitamin D deficiency.   Hx of depression in remission, did well on pristiq at one point.  Patient is on an ADD medication, adderall 20 mg on work days PRN, but currently taking a prolonged break, hasn't taken in several months, wants to remain off for now. She is prescribed ambien 5 mg by GYN Dr. [name], takes during the week, tries to skip on the weekend.   She reports she has been followed by ortho for cervical facet issues that seem to be contributing to "migraines" - tension headaches, triggered by stress, sinus. Takes tylenol, sinus med PRN, occasional nurtec samples, but notes has improved significantly (reduced stress).   She has chronic intermittent lumbar pain attributed to sedentary job, ortho follows. Also restless legs, "calm" magnesium gummies have helped.   BMI is Body mass index is 24.67 kg/m., she has been working on diet; she is active with her grandkids, thinking about standing desk at work as very sedentary. Weight has been an ongoing struggle. She failed phentermine/topamax, success with wegovy but coverage issues, now on mounjaro, doing very well, helps with cravings and satiety, has tapered down from 15 mg to 12.5 mg/week taking every 10th day or so per patient.  Wt Readings from Last 3 Encounters:  02/24/22 146 lb (66.2 kg)  12/01/21 150 lb (68 kg)  09/08/21 159 lb (72.1 kg)    She is not on cholesterol medication. Her cholesterol is not at goal. The cholesterol last visit was:   Lab Results  Component Value Date   CHOL 201 (H) 09/08/2021   HDL 42 (L) 09/08/2021   LDLCALC 142 (H) 09/08/2021   TRIG 77 09/08/2021   CHOLHDL 4.8 09/08/2021   Hx of insulin resistance; working on diet/exercise/weight. Last A1C in the  office was:  Lab Results  Component Value Date   HGBA1C 4.9 08/27/2020   Patient is on Vitamin D supplement.   Lab Results  Component Value Date   VD25OH 57 09/08/2021        Current Medications:  Current Outpatient  Medications on File Prior to Visit  Medication Sig   Cholecalciferol (VITAMIN D PO) Take 10,000 Units by mouth.    lidocaine (LIDODERM) 5 % Apply 1 patch every 12 hours. Remove & Discard patch within 12 hours or as directed by MD   MAGNESIUM PO Take by mouth. Takess 4 gummies daily   tirzepatide (MOUNJARO) 12.5 MG/0.5ML Pen Inject 12.5 mg into the skin once a week.   zolpidem (AMBIEN) 5 MG tablet Take 5 mg by mouth at bedtime as needed.   No current facility-administered medications on file prior to visit.     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  HENT:  Negative for hearing loss and tinnitus.   Eyes:  Negative for blurred vision and double vision.  Respiratory:  Negative for cough, shortness of breath and wheezing.   Cardiovascular:  Negative for chest pain, palpitations, orthopnea, claudication and leg swelling.  Gastrointestinal:  Negative for abdominal pain, blood in stool, constipation, diarrhea, heartburn, melena, nausea and vomiting.  Genitourinary: Negative.   Musculoskeletal:  Positive for back pain (lumbar, chronic intermittent, after work). Negative for joint pain and myalgias.  Skin:  Negative for rash.  Neurological:  Negative for dizziness, tingling, sensory change, weakness and headaches.  Endo/Heme/Allergies:  Negative for polydipsia.  Psychiatric/Behavioral: Negative.    All  other systems reviewed and are negative.   Physical Exam: BP 104/76   Pulse 79   Temp (!) 96.9 F (36.1 C)   Ht 5' 4.5" (1.638 m)   Wt 146 lb (66.2 kg)   SpO2 99%   BMI 24.67 kg/m  Wt Readings from Last 3 Encounters:  02/24/22 146 lb (66.2 kg)  12/01/21 150 lb (68 kg)  09/08/21 159 lb (72.1 kg)   General Appearance: Well nourished, in no apparent distress. Eyes: PERRLA, EOMs, conjunctiva no swelling or erythema Sinuses: No Frontal/maxillary tenderness ENT/Mouth: Ext aud canals clear, TMs without erythema, bulging. No erythema, swelling, or exudate on post pharynx.  Tonsils not swollen or erythematous. Hearing normal.  Neck: Supple, thyroid normal.  Respiratory: Respiratory effort normal, BS equal bilaterally without rales, rhonchi, wheezing or stridor.  Cardio: RRR with no MRGs. Brisk peripheral pulses without edema.  Abdomen: Soft, + BS.  Non tender, no guarding, rebound, hernias, masses. Lymphatics: Non tender without lymphadenopathy.  Musculoskeletal: Full ROM, 5/5 strength, Normal gait Skin: Warm, dry without rashes, lesions, ecchymosis.  Neuro: Cranial nerves intact. No cerebellar symptoms.  Psych: Awake and oriented X 3, normal affect, Insight and Judgment appropriate.   Carla Ribas,  NP 10:05 AM  [facility]

## 2022-02-24 ENCOUNTER — Encounter: Payer: Self-pay | Admitting: Adult Health

## 2022-02-24 ENCOUNTER — Ambulatory Visit (INDEPENDENT_AMBULATORY_CARE_PROVIDER_SITE_OTHER): Payer: 59 | Admitting: Adult Health

## 2022-02-24 VITALS — BP 104/76 | HR 79 | Temp 96.9°F | Ht 64.5 in | Wt 146.0 lb

## 2022-02-24 DIAGNOSIS — E782 Mixed hyperlipidemia: Secondary | ICD-10-CM

## 2022-02-24 DIAGNOSIS — F988 Other specified behavioral and emotional disorders with onset usually occurring in childhood and adolescence: Secondary | ICD-10-CM

## 2022-02-24 DIAGNOSIS — G43009 Migraine without aura, not intractable, without status migrainosus: Secondary | ICD-10-CM

## 2022-02-24 DIAGNOSIS — E161 Other hypoglycemia: Secondary | ICD-10-CM

## 2022-02-24 DIAGNOSIS — F3340 Major depressive disorder, recurrent, in remission, unspecified: Secondary | ICD-10-CM

## 2022-02-24 DIAGNOSIS — E663 Overweight: Secondary | ICD-10-CM

## 2022-02-24 DIAGNOSIS — G4700 Insomnia, unspecified: Secondary | ICD-10-CM

## 2022-02-24 DIAGNOSIS — E559 Vitamin D deficiency, unspecified: Secondary | ICD-10-CM

## 2022-02-24 DIAGNOSIS — Z6824 Body mass index (BMI) 24.0-24.9, adult: Secondary | ICD-10-CM

## 2022-02-24 DIAGNOSIS — E538 Deficiency of other specified B group vitamins: Secondary | ICD-10-CM

## 2022-02-25 LAB — COMPLETE METABOLIC PANEL WITH GFR
AG Ratio: 2.1 (calc) (ref 1.0–2.5)
ALT: 16 U/L (ref 6–29)
AST: 12 U/L (ref 10–35)
Albumin: 4.7 g/dL (ref 3.6–5.1)
Alkaline phosphatase (APISO): 76 U/L (ref 37–153)
BUN: 14 mg/dL (ref 7–25)
CO2: 28 mmol/L (ref 20–32)
Calcium: 9.7 mg/dL (ref 8.6–10.4)
Chloride: 102 mmol/L (ref 98–110)
Creat: 0.89 mg/dL (ref 0.50–1.03)
Globulin: 2.2 g/dL (calc) (ref 1.9–3.7)
Glucose, Bld: 75 mg/dL (ref 65–99)
Potassium: 4.9 mmol/L (ref 3.5–5.3)
Sodium: 139 mmol/L (ref 135–146)
Total Bilirubin: 0.4 mg/dL (ref 0.2–1.2)
Total Protein: 6.9 g/dL (ref 6.1–8.1)
eGFR: 76 mL/min/{1.73_m2} (ref >=60)

## 2022-02-25 LAB — LIPID PANEL
Cholesterol: 238 mg/dL — ABNORMAL HIGH (ref <200)
HDL: 47 mg/dL — ABNORMAL LOW (ref >=50)
LDL Cholesterol (Calc): 171 mg/dL (calc) — ABNORMAL HIGH
Non-HDL Cholesterol (Calc): 191 mg/dL (calc) — ABNORMAL HIGH (ref <130)
Total CHOL/HDL Ratio: 5.1 (calc) — ABNORMAL HIGH (ref <5.0)
Triglycerides: 88 mg/dL (ref <150)

## 2022-03-04 ENCOUNTER — Other Ambulatory Visit: Payer: Self-pay | Admitting: Nurse Practitioner

## 2022-03-04 MED ORDER — EZETIMIBE 10 MG PO TABS: 10.0000 mg | ORAL_TABLET | Freq: Every day | ORAL | 11 refills | Status: DC

## 2022-03-14 ENCOUNTER — Ambulatory Visit (INDEPENDENT_AMBULATORY_CARE_PROVIDER_SITE_OTHER): Payer: 59 | Admitting: Nurse Practitioner

## 2022-03-14 ENCOUNTER — Encounter: Payer: Self-pay | Admitting: Nurse Practitioner

## 2022-03-14 VITALS — BP 100/64 | HR 78 | Temp 97.7°F | Ht 64.5 in | Wt 146.2 lb

## 2022-03-14 DIAGNOSIS — T63461A Toxic effect of venom of wasps, accidental (unintentional), initial encounter: Secondary | ICD-10-CM

## 2022-03-14 MED ORDER — DEXAMETHASONE SODIUM PHOSPHATE 10 MG/ML IJ SOLN
10.0000 mg | Freq: Once | INTRAMUSCULAR | Status: AC
  Administered 2022-03-14: 10 mg via INTRAMUSCULAR

## 2022-03-14 NOTE — Progress Notes (Signed)
Assessment and Plan:    Zetta was seen today for insect bite.  Diagnoses and all orders for this visit:  Yellow jacket sting, accidental or unintentional, initial encounter Given 10 mg Dexamethasone IM today, induration area is marked. Will monitor and if induration increases will plan an oral steroid taper Use Zyrtec daily and benadryl at night Use ice and baking soda paste to site If any trouble breathing or swallowing she is to go to the ER immediately -     dexamethasone (DECADRON) injection 10 mg   Hyperlipidemia Continue Zetia, diet and exercise    Further disposition pending results of labs. Discussed med's effects and SE's.   Over 30 minutes of exam, counseling, chart review, and critical decision making was performed.   Future Appointments  Date Time Provider [place]  03/14/2022  9:30 AM Alycia Rossetti, NP [facility] None  03/31/2022  8:45 AM Alycia Rossetti, NP [facility] None  09/08/2022  3:00 PM [place], Kenney Houseman, NP [facility] None    ------------------------------------------------------------------------------------------------------------------   HPI BP 100/64   Pulse 78   Temp 97.7 F (36.5 C)   Wt 146 lb 3.2 oz (66.3 kg)   SpO2 99%   BMI 24.71 kg/m   55 y.o.female presents for Sting by yellow jacket on her left thigh 2 days.  The area is still warm and tender and the redness is increasing in size surrounding the sting.  She has started on Zetia 10 mg for elevated cholesterol.  She is eating less red meat, dairy, eggs and fried foods.  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   Cholecalciferol (VITAMIN D PO) Take 10,000 Units by mouth.    ezetimibe (ZETIA) 10 MG tablet Take 1 tablet (10 mg total) by mouth daily.   lidocaine (LIDODERM) 5 % Apply 1 patch every 12 hours. Remove & Discard patch within 12 hours or as directed by MD   MAGNESIUM PO Take by mouth. Takess 4 gummies daily   Menaquinone-7 (VITAMIN K2 PO) Take by mouth.   omeprazole (PRILOSEC) 20 MG capsule Take 20 mg by mouth daily.   tirzepatide (MOUNJARO) 12.5 MG/0.5ML Pen Inject 12.5 mg into the skin once a week.   zolpidem (AMBIEN) 5 MG tablet Take 5 mg by mouth at bedtime as needed.   No current facility-administered medications on file prior to visit.    ROS: all negative except above.   Physical Exam:  BP 100/64   Pulse 78   Temp 97.7 F (36.5 C)   Wt 146 lb 3.2 oz (66.3 kg)   SpO2 99%   BMI 24.71 kg/m   General Appearance: Well nourished, in no apparent  distress. Eyes: PERRLA, EOMs, conjunctiva no swelling or erythema Sinuses: No Frontal/maxillary tenderness ENT/Mouth: Ext aud canals clear, TMs without erythema, bulging. No erythema, swelling, or exudate on post pharynx.  Tonsils not swollen or erythematous. Hearing normal.  Neck: Supple, thyroid normal.  Respiratory: Respiratory effort normal, BS equal bilaterally without rales, rhonchi, wheezing or stridor.  Cardio: RRR with no MRGs. Brisk peripheral pulses without edema.  Abdomen: Soft, + BS.  Non tender, no guarding, rebound, hernias, masses. Lymphatics: Non tender without lymphadenopathy.  Musculoskeletal: Full ROM, 5/5 strength, normal gait.  Skin: Warm, dry . 7 X 10 cm indurated, erythematous, warm area on left thigh-  mildly tender.  Neuro: Cranial nerves intact. Normal muscle tone, no cerebellar symptoms. Sensation intact.  Psych: Awake and oriented X 3, normal affect, Insight and Judgment appropriate.     Alycia Rossetti, NP 8:58 AM [facility]

## 2022-03-23 ENCOUNTER — Encounter: Payer: Self-pay | Admitting: Nurse Practitioner

## 2022-03-23 ENCOUNTER — Ambulatory Visit (INDEPENDENT_AMBULATORY_CARE_PROVIDER_SITE_OTHER): Payer: 59 | Admitting: Nurse Practitioner

## 2022-03-23 ENCOUNTER — Ambulatory Visit (HOSPITAL_COMMUNITY)
Admission: RE | Admit: 2022-03-23 | Discharge: 2022-03-23 | Disposition: A | Discharge status: Home / Self Care | Payer: 59 | Source: Ambulatory Visit | Attending: Nurse Practitioner | Admitting: Nurse Practitioner

## 2022-03-23 VITALS — BP 100/62 | HR 93 | Temp 97.9°F | Ht 64.5 in | Wt 145.6 lb

## 2022-03-23 DIAGNOSIS — R1030 Lower abdominal pain, unspecified: Secondary | ICD-10-CM | POA: Diagnosis present

## 2022-03-23 DIAGNOSIS — R109 Unspecified abdominal pain: Secondary | ICD-10-CM | POA: Insufficient documentation

## 2022-03-23 DIAGNOSIS — R300 Dysuria: Secondary | ICD-10-CM

## 2022-03-23 MED ORDER — NITROFURANTOIN MONOHYD MACRO 100 MG PO CAPS: 100.0000 mg | ORAL_CAPSULE | Freq: Two times a day (BID) | ORAL | 0 refills | Status: AC

## 2022-03-23 NOTE — Progress Notes (Signed)
Assessment and Plan:  Carla Fuller was seen today for acute visit.  Diagnoses and all orders for this visit:  Dysuria Push fluids, start Macrobid, will change antibiotic depending on culture results -     Urinalysis, Routine w reflex microscopic -     Urine Culture -     CT RENAL STONE STUDY; Future -     nitrofurantoin, macrocrystal-monohydrate, (MACROBID) 100 MG capsule; Take 1 capsule (100 mg total) by mouth 2 (two) times daily for 7 days.  Flank pain/Lower abdominal pain Push fluids If develop increase in pain, nausea/vomiting, fever or worsening of blood in urine go to the ER -     CT RENAL STONE STUDY; Future       Further disposition pending results of labs. Discussed med's effects and SE's.   Over 30 minutes of exam, counseling, chart review, and critical decision making was performed.   Future Appointments  Date Time Provider [place]  03/23/2022 10:45 AM Alycia Rossetti, NP [facility] None  03/31/2022  8:45 AM Alycia Rossetti, NP [facility] None  09/08/2022  3:00 PM Darrol Jump, NP [facility] None    ------------------------------------------------------------------------------------------------------------------   HPI BP 100/62   Pulse 93   Temp 97.9 F (36.6 C)   Ht 5' 4.5" (1.638 m)   SpO2 97%   BMI 24.71 kg/m   56 y.o.female presents for pain in lower abdomen associated with blood and small blood clots.  She has difficulty urinating and when she does it is associated with severe sharp pain.  She has also had some lower back pain.  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   Cholecalciferol (VITAMIN D PO) Take 10,000 Units by mouth.    ezetimibe (ZETIA) 10 MG tablet Take 1 tablet (10 mg total) by mouth daily.   lidocaine (LIDODERM) 5 % Apply 1 patch every 12 hours. Remove & Discard patch within 12 hours or as directed by MD   MAGNESIUM PO Take by mouth. Takess 4 gummies daily   Menaquinone-7 (VITAMIN K2 PO) Take by mouth.   omeprazole (PRILOSEC) 20 MG capsule Take 20 mg by mouth daily.   tirzepatide (MOUNJARO) 12.5 MG/0.5ML Pen Inject 12.5 mg into the skin once a week.   zolpidem (AMBIEN) 5 MG tablet Take 5 mg by mouth at bedtime as needed.   No current facility-administered medications on file prior to visit.    ROS: all negative except above.   Physical Exam:  BP 100/62   Pulse 93   Temp 97.9 F (36.6 C)   Ht 5' 4.5" (1.638 m)   SpO2 97%   BMI 24.71 kg/m   General Appearance: Well nourished, in mild pain Neck: Supple, thyroid normal. Respiratory: Respiratory effort normal, BS equal bilaterally without rales, rhonchi, wheezing or stridor.  Cardio: RRR with no MRGs. Brisk peripheral pulses without edema.  Abdomen: Soft, + BS.  Tenderness  of lower abdomen , suprapubic pain Lymphatics: Non tender without lymphadenopathy.  Musculoskeletal: Full ROM, 5/5 strength, normal gait. Negative CVA tenderness  Skin: Warm, dry without rashes, lesions, ecchymosis.  Neuro: Cranial nerves intact. Normal muscle tone, no cerebellar symptoms. Sensation intact.  Psych: Awake and oriented X 3, normal affect, Insight and Judgment appropriate.     Alycia Rossetti, NP 10:03 AM [facility]

## 2022-03-26 LAB — URINALYSIS, ROUTINE W REFLEX MICROSCOPIC
Bacteria, UA: NONE SEEN /HPF
Bilirubin Urine: NEGATIVE
Glucose, UA: NEGATIVE
Hyaline Cast: NONE SEEN /LPF
Ketones, ur: NEGATIVE
Nitrite: NEGATIVE
Protein, ur: NEGATIVE
RBC / HPF: NONE SEEN /HPF (ref 0–2)
Specific Gravity, Urine: 1.006 (ref 1.001–1.035)
Squamous Epithelial / HPF: NONE SEEN /HPF (ref <=5)
pH: 6.5 (ref 5.0–8.0)

## 2022-03-26 LAB — URINE CULTURE
MICRO NUMBER:: 13756954
SPECIMEN QUALITY:: ADEQUATE

## 2022-03-26 LAB — MICROSCOPIC MESSAGE

## 2022-03-30 NOTE — Progress Notes (Unsigned)
FOLLOW UP  Assessment and Plan:   Carla Fuller was seen today for follow-up.  Diagnoses and all orders for this visit:  Migraine without aura and without status migrainosus, not intractable Stress trigger; nurtec works well if needed Stress management strategies reviewed   Recurrent major depression in remission (Carla Fuller) In remission off of medications; restart pristiq if needed Lifestyle discussed: diet/exerise, sleep hygiene, stress management, hydration  Attention deficit disorder (ADD) without hyperactivity Currently off of adderall; taking extended break and managing per patient Working on lifestyle  Insomnia, unspecified type Ambien PRN via GYN  Hyperinsulinemia Working on lifestyle/weightloss -     COMPLETE METABOLIC PANEL WITH GFR  Mixed hyperlipidemia Atypical elevation last check, working on lifestyle Continue low cholesterol diet and exercise.  Check lipid panel.  -     COMPLETE METABOLIC PANEL WITH GFR -     Lipid panel  Vitamin D deficiency  BMI 24.0-24.9, adult Congratulated excellent progress - normal BMI Long discussion about weight loss, diet, and exercise Plans to start standing desk Recommended diet with adequate protein, high fiber Discussed bulk eating strategies Has been on mounjaro with good results, trying to taper down  Could try cinnamon 1000 mg/Berberine 500 mg supplements, titrate up to TID with meals if tolerated;  Follow up at next visit   Continue diet and meds as discussed. Further disposition pending results of labs. Discussed med's effects and SE's.   Over 30 minutes of exam, counseling, chart review, and critical decision making was performed.   Future Appointments  Date Time Provider [place]  03/31/2022  8:45 AM Alycia Rossetti, NP [facility] None  09/08/2022  3:00 PM Darrol Jump, NP [facility] None     ----------------------------------------------------------------------------------------------------------------------  HPI 56 y.o. female  presents for 6 month follow up on cholesterol, mood, weight and vitamin D deficiency.   Hx of depression in remission, did well on pristiq at one point.  Patient is on an ADD medication, adderall 20 mg on work days PRN, but currently taking a prolonged break, hasn't taken in several months, wants to remain off for now. She is prescribed ambien 5 mg by GYN Dr. Corinna Fuller, takes during the week, tries to skip on the weekend.   She reports she has been followed by ortho for cervical facet issues that seem to be contributing to "migraines" - tension headaches, triggered by stress, sinus. Takes tylenol, sinus med PRN, occasional nurtec samples, but notes has improved significantly (reduced stress).   She has chronic intermittent lumbar pain attributed to sedentary job, ortho follows. Also restless legs, "calm" magnesium gummies have helped.   BMI is There is no height or weight on file to calculate BMI., she has been working on diet; she is active with her grandkids, thinking about standing desk at work as very sedentary. Weight has been an ongoing struggle. She failed phentermine/topamax, success with wegovy but coverage issues, now on mounjaro, doing very well, helps with cravings and satiety, has tapered down from 15 mg to 12.5 mg/week taking every 10th day or so per patient.  Wt Readings from Last 3 Encounters:  03/23/22 145 lb 9.6 oz (66 kg)  03/14/22 146 lb 3.2 oz (66.3 kg)  02/24/22 146 lb (66.2 kg)    She is not on cholesterol medication. Her cholesterol is not at goal. The cholesterol last visit was:   Lab Results  Component Value Date   CHOL 238 (H) 02/24/2022   HDL 47 (L) 02/24/2022   LDLCALC 171 (H) 02/24/2022   TRIG  88 02/24/2022   CHOLHDL 5.1 (H) 02/24/2022   Hx of insulin resistance; working on diet/exercise/weight. Last A1C in the office  was:  Lab Results  Component Value Date   HGBA1C 4.9 08/27/2020   Patient is on Vitamin D supplement.   Lab Results  Component Value Date   VD25OH 57 09/08/2021        Current Medications:  Current Outpatient Medications on File Prior to Visit  Medication Sig   B Complex-Biotin-FA (B-COMPLEX PO) Take by mouth.   Cholecalciferol (VITAMIN D PO) Take 10,000 Units by mouth.    ezetimibe (ZETIA) 10 MG tablet Take 1 tablet (10 mg total) by mouth daily.   lidocaine (LIDODERM) 5 % Apply 1 patch every 12 hours. Remove & Discard patch within 12 hours or as directed by MD   MAGNESIUM PO Take by mouth. Takess 4 gummies daily   Menaquinone-7 (VITAMIN K2 PO) Take by mouth.   nitrofurantoin, macrocrystal-monohydrate, (MACROBID) 100 MG capsule Take 1 capsule (100 mg total) by mouth 2 (two) times daily for 7 days.   omeprazole (PRILOSEC) 20 MG capsule Take 20 mg by mouth daily.   tirzepatide (MOUNJARO) 12.5 MG/0.5ML Pen Inject 12.5 mg into the skin once a week.   zolpidem (AMBIEN) 5 MG tablet Take 5 mg by mouth at bedtime as needed.   No current facility-administered medications on file prior to visit.     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  HENT:  Negative for hearing loss and tinnitus.   Eyes:  Negative for blurred vision and double vision.  Respiratory:  Negative for cough, shortness of breath  and wheezing.   Cardiovascular:  Negative for chest pain, palpitations, orthopnea, claudication and leg swelling.  Gastrointestinal:  Negative for abdominal pain, blood in stool, constipation, diarrhea, heartburn, melena, nausea and vomiting.  Genitourinary: Negative.   Musculoskeletal:  Positive for back pain (lumbar, chronic intermittent, after work). Negative for joint pain and myalgias.  Skin:  Negative for rash.  Neurological:  Negative for dizziness, tingling, sensory change, weakness and headaches.  Endo/Heme/Allergies:  Negative for polydipsia.  Psychiatric/Behavioral: Negative.    All other systems reviewed and are negative.   Physical Exam: There were no vitals taken for this visit. Wt Readings from Last 3 Encounters:  03/23/22 145 lb 9.6 oz (66 kg)  03/14/22 146 lb 3.2 oz (66.3 kg)  02/24/22 146 lb (66.2 kg)   General Appearance: Well nourished, in no apparent distress. Eyes: PERRLA, EOMs, conjunctiva no swelling or erythema Sinuses: No Frontal/maxillary tenderness ENT/Mouth: Ext aud canals clear, TMs without erythema, bulging. No erythema, swelling, or exudate on post pharynx.  Tonsils not swollen or erythematous. Hearing normal.  Neck: Supple, thyroid normal.  Respiratory: Respiratory effort normal, BS equal bilaterally without rales, rhonchi, wheezing or stridor.  Cardio: RRR with no  MRGs. Brisk peripheral pulses without edema.  Abdomen: Soft, + BS.  Non tender, no guarding, rebound, hernias, masses. Lymphatics: Non tender without lymphadenopathy.  Musculoskeletal: Full ROM, 5/5 strength, Normal gait Skin: Warm, dry without rashes, lesions, ecchymosis.  Neuro: Cranial nerves intact. No cerebellar symptoms.  Psych: Awake and oriented X 3, normal affect, Insight and Judgment appropriate.   Alycia Rossetti, NP 12:21 PM [facility]

## 2022-03-31 ENCOUNTER — Encounter: Payer: Self-pay | Admitting: Nurse Practitioner

## 2022-03-31 ENCOUNTER — Ambulatory Visit (INDEPENDENT_AMBULATORY_CARE_PROVIDER_SITE_OTHER): Payer: 59 | Admitting: Nurse Practitioner

## 2022-03-31 VITALS — BP 100/62 | HR 98 | Temp 97.3°F | Ht 64.5 in | Wt 144.4 lb

## 2022-03-31 DIAGNOSIS — D75839 Thrombocytosis, unspecified: Secondary | ICD-10-CM

## 2022-03-31 DIAGNOSIS — E782 Mixed hyperlipidemia: Secondary | ICD-10-CM | POA: Diagnosis not present

## 2022-04-01 LAB — LIPID PANEL
Cholesterol: 174 mg/dL (ref <200)
HDL: 47 mg/dL — ABNORMAL LOW (ref >=50)
LDL Cholesterol (Calc): 111 mg/dL (calc) — ABNORMAL HIGH
Non-HDL Cholesterol (Calc): 127 mg/dL (calc) (ref <130)
Total CHOL/HDL Ratio: 3.7 (calc) (ref <5.0)
Triglycerides: 71 mg/dL (ref <150)

## 2022-04-01 LAB — CBC WITH DIFFERENTIAL/PLATELET
Absolute Monocytes: 426 cells/uL (ref 200–950)
Basophils Absolute: 48 cells/uL (ref 0–200)
Basophils Relative: 0.8 %
Eosinophils Absolute: 90 cells/uL (ref 15–500)
Eosinophils Relative: 1.5 %
HCT: 39.7 % (ref 35.0–45.0)
Hemoglobin: 13.5 g/dL (ref 11.7–15.5)
Lymphs Abs: 2274 cells/uL (ref 850–3900)
MCH: 29.7 pg (ref 27.0–33.0)
MCHC: 34 g/dL (ref 32.0–36.0)
MCV: 87.3 fL (ref 80.0–100.0)
MPV: 8.6 fL (ref 7.5–12.5)
Monocytes Relative: 7.1 %
Neutro Abs: 3162 cells/uL (ref 1500–7800)
Neutrophils Relative %: 52.7 %
Platelets: 422 10*3/uL — ABNORMAL HIGH (ref 140–400)
RBC: 4.55 10*6/uL (ref 3.80–5.10)
RDW: 12.1 % (ref 11.0–15.0)
Total Lymphocyte: 37.9 %
WBC: 6 10*3/uL (ref 3.8–10.8)

## 2022-04-05 ENCOUNTER — Other Ambulatory Visit: Payer: Self-pay | Admitting: Nurse Practitioner

## 2022-04-05 ENCOUNTER — Encounter: Payer: Self-pay | Admitting: Nurse Practitioner

## 2022-04-05 ENCOUNTER — Other Ambulatory Visit: Payer: Self-pay | Admitting: Internal Medicine

## 2022-04-05 DIAGNOSIS — Z1231 Encounter for screening mammogram for malignant neoplasm of breast: Secondary | ICD-10-CM

## 2022-04-05 DIAGNOSIS — G8929 Other chronic pain: Secondary | ICD-10-CM

## 2022-04-05 MED ORDER — METHOCARBAMOL 500 MG PO TABS: 500.0000 mg | ORAL_TABLET | Freq: Four times a day (QID) | ORAL | 1 refills | Status: DC | PRN

## 2022-05-20 ENCOUNTER — Ambulatory Visit
Admission: RE | Admit: 2022-05-20 | Discharge: 2022-05-20 | Disposition: A | Discharge status: Home / Self Care | Payer: 59 | Source: Ambulatory Visit

## 2022-05-20 DIAGNOSIS — Z1231 Encounter for screening mammogram for malignant neoplasm of breast: Secondary | ICD-10-CM

## 2022-06-15 ENCOUNTER — Other Ambulatory Visit: Payer: Self-pay

## 2022-06-15 DIAGNOSIS — E119 Type 2 diabetes mellitus without complications: Secondary | ICD-10-CM

## 2022-06-15 MED ORDER — MOUNJARO 15 MG/0.5ML ~~LOC~~ SOAJ: 15.0000 mg | SUBCUTANEOUS | 3 refills | Status: DC

## 2022-06-24 ENCOUNTER — Other Ambulatory Visit: Payer: Self-pay | Admitting: Nurse Practitioner

## 2022-06-24 DIAGNOSIS — R110 Nausea: Secondary | ICD-10-CM

## 2022-06-24 MED ORDER — ONDANSETRON HCL 4 MG PO TABS: 4.0000 mg | ORAL_TABLET | Freq: Every day | ORAL | 1 refills | Status: DC | PRN

## 2022-08-10 ENCOUNTER — Other Ambulatory Visit: Payer: Self-pay | Admitting: Nurse Practitioner

## 2022-08-10 DIAGNOSIS — B3731 Acute candidiasis of vulva and vagina: Secondary | ICD-10-CM

## 2022-08-10 MED ORDER — FLUCONAZOLE 150 MG PO TABS: ORAL_TABLET | ORAL | 1 refills | Status: DC

## 2022-09-05 ENCOUNTER — Other Ambulatory Visit: Payer: Self-pay | Admitting: Nurse Practitioner

## 2022-09-05 DIAGNOSIS — G8929 Other chronic pain: Secondary | ICD-10-CM

## 2022-09-06 ENCOUNTER — Other Ambulatory Visit: Payer: Self-pay | Admitting: Internal Medicine

## 2022-09-08 ENCOUNTER — Encounter: Payer: 59 | Admitting: Nurse Practitioner

## 2022-09-10 NOTE — Progress Notes (Deleted)
Complete Physical  Assessment and Plan:  Carla Fuller was seen today for annual exam.  Diagnoses and all orders for this visit:  Encounter for Annual Physical Exam with abnormal findings Due annually  Health Maintenance reviewed Healthy lifestyle reviewed and goals set  Follows with GYN Dr. [name] Mammograms at breast center Check with insurance about shingrix  Hyperlipidemia Mild, treated by lifestyle interventions Continue low cholesterol diet and exercise.  Check lipid panel.  -     Lipid panel  Attention deficit disorder (ADD) without hyperactivity Continue medications PRN Helps with focus, no AE's. The patient was counseled on the addictive nature of the medication and was encouraged to take drug holidays when not needed.   Chronic low back pain, unspecified back pain laterality, with sciatica presence unspecified       -     Chronic tailbone pain; managed by avoidance of pressure and medications as needed. Sees ortho.  - check CRP per patient pref  Migraine without aura and without status migrainosus, not intractable Recently improved post tooth extration, well managed by OTC analgesics PRN -     Magnesium  Moderate episode of recurrent major depressive disorder in remission [facility]) She reports off of medications and doing much better; remission; monitor; failed numerous in the past, most recently on pristiq which worked well  Lifestyle discussed: diet/exerise, sleep hygiene, stress management, hydration  Insomnia, unspecified type       -      Moderately well managed by current medications, ambien via Dr. Nori Riis and melatonin  Overweight- BMI 27 Long discussion about weight loss, diet, and exercise Recommended diet heavy in fruits and veggies and low in animal meats, cheeses, and dairy products, appropriate calorie intake Patient will work on exercise, particularly weight lifting and resistance exercise Excellent progress with mounjaro - sent in 15 mg/week dose High fiber  and protein diet, avoid processed carbs, add regular resistance exercises  Follow up at next visit  Vitamin D deficiency -     VITAMIN D 25 Hydroxy (Vit-D Deficiency, Fractures)  Environmental allergies      -      Well managed by current medications  Nocturnal leg cramps ABI was normal, CBC, iron has been normal  Doing well with requip/magnesium/heat; increase walking  Medication management -     CBC with Differential/Platelet -     CMP/GFR  Screening for cardiovascular condition -     Lipid panel -     EKG 12-Lead - defer  Screening for hematuria or proteinuria -     Urinalysis, Complete (81001)  Screening for thyroid disorder -     TSH  Screening for diabetes mellitus -     Hemoglobin A1c - defer after shared med decision  Chronic kidney disease (CKD), stage II (mild) -     COMPLETE METABOLIC PANEL WITH GFR  Chronic constipation Improved with "calm" magnesium Encouraged high fiber diet, hydration, regular exercise    No orders of the defined types were placed in this encounter.    Discussed med's effects and SE's. Screening labs and tests as requested with regular follow-up as recommended. Over 40 minutes of exam, counseling, chart review, and complex, high level critical decision making was performed this visit.   Future Appointments  Date Time Provider [place]  09/12/2022  3:00 PM Alycia Rossetti, NP [facility] None    HPI  57 y.o. married Caucasian female  presents for a complete physical and follow up for has Insomnia; Vitamin D deficiency; ADD (  attention deficit disorder); Migraines; Allergy; Chronic lumbar pain; Nocturnal leg cramps; Recurrent major depression in remission ([place]); BMI 24.0-24.9, adult; Osteopenia; Mixed hyperlipidemia; Chronic constipation; B12 deficiency; and Hyperinsulinemia on their problem list..   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 Enjoys spending time  with grandkids.   Follows with GYN - Dr. [name] - for PAPs. He is prescribing estradiol and progesterone. She is on bASA. Gets mammograms at breast center.   She had fall with R shoulder injury, repeated dislocations, underwent arthroscopic repair 05/30/2020 by Dr. Tamera Punt, completed PT and essentially resolved.   Hx of depression in remission, did well on pristiq at one point.  Patient is on an ADD medication, adderall 20 mg on work days PRN, denies SE and works when needed. Currently taking prolonged break.   She has dx of moderate depression, has failed many agents, lexapro, zoloft, wellbutrin, did well with pristiq for 18 months but stopped and reprots is actually doing fairly with just her night time sleeping medication (ambien 5 mg via Dr. [name]).   She reports she has been followed by ortho for cervical facet issues that seem to be contributing to "migraines" - tension headaches, triggered by stress, sinus. Takes tylenol, sinus med PRN, occasional nurtec samples.   She has chronic intermittent lumbar pain attributed to sedentary job, ortho follows. Also restless legs, "calm" magnesium gummies have helped. Heating blanket at night. Takes requip 2 mg in the evening if needed.   BMI is There is no height or weight on file to calculate BMI., she has been working on diet; she is active with her grandkids. Weight has been an ongoing struggle. She has failed phentermine/topamax., did get on wegovy with samples last year with good success though with insurance coverage issues. She has been on mounjaro with coupon, now on 12.5 mg, tolerates well and down over 20 lb on home scale, takes magnesium supplement for constipation.  Wt Readings from Last 3 Encounters:  03/31/22 144 lb 6.4 oz (65.5 kg)  03/23/22 145 lb 9.6 oz (66 kg)  03/14/22 146 lb 3.2 oz (66.3 kg)   Today their BP is     She does workout. She denies chest pain, shortness of breath, dizziness.   She is not on cholesterol medication. Her  cholesterol is not at goal. The cholesterol last visit was:   Lab Results  Component Value Date   CHOL 174 03/31/2022   HDL 47 (L) 03/31/2022   LDLCALC 111 (H) 03/31/2022   TRIG 71 03/31/2022   CHOLHDL 3.7 03/31/2022    Last A1C in the office was:  Lab Results  Component Value Date   HGBA1C 4.9 08/27/2020   Last GFR: Lab Results  Component Value Date   GFRNONAA 79 08/27/2020   Patient is on Vitamin D supplement and at goal, taking reduced dose vit D 2000 IU with K2 combo.  Lab Results  Component Value Date   VD25OH 25 09/08/2021     She reports is not currently on a supplement -  Lab Results  Component Value Date   [id number] 09/08/2021      Current Medications:  Current Outpatient Medications on File Prior to Visit  Medication Sig Dispense Refill   B Complex-Biotin-FA (B-COMPLEX PO) Take by mouth.     Cholecalciferol (VITAMIN D PO) Take 10,000 Units by mouth.      ezetimibe (ZETIA) 10 MG tablet Take 1 tablet (10 mg total) by mouth  daily. 30 tablet 11   fluconazole (DIFLUCAN) 150 MG tablet Take 1 tab day 1 and day 4 2 tablet 1   lidocaine (LIDODERM) 5 % Apply 1 patch every 12 hours. Remove & Discard patch within 12 hours or as directed by MD 6 patch 0   MAGNESIUM PO Take by mouth. Takess 4 gummies daily     Menaquinone-7 (VITAMIN K2 PO) Take by mouth.     methocarbamol (ROBAXIN) 500 MG tablet TAKE 1 TABLET(500 MG) BY MOUTH EVERY 6 HOURS AS NEEDED FOR MUSCLE SPASMS 60 tablet 1   omeprazole (PRILOSEC) 20 MG capsule Take 20 mg by mouth daily.     ondansetron (ZOFRAN) 4 MG tablet Take 1 tablet (4 mg total) by mouth daily as needed for nausea or vomiting. 30 tablet 1   tirzepatide (MOUNJARO) 15 MG/0.5ML Pen Inject 15 mg into the skin once a week. 2 mL 3   No current facility-administered medications on file prior to visit.   Allergies:  Allergies  Allergen Reactions   Percocet [Oxycodone-Acetaminophen]    Medical History:  She has Insomnia; Vitamin D deficiency;  ADD (attention deficit disorder); Migraines; Allergy; Chronic lumbar pain; Nocturnal leg cramps; Recurrent major depression in remission ([place]); BMI 24.0-24.9, adult; Osteopenia; Mixed hyperlipidemia; Chronic constipation; B12 deficiency; and Hyperinsulinemia on their problem list.  Health Maintenance:   Immunization History  Administered Date(s) Administered   Influenza Inj Mdck Quad With Preservative 05/16/2018, 05/22/2019, 07/07/2020   Influenza Split 05/09/2014   Influenza,inj,Quad PF,6+ Mos 05/31/2021   Influenza-Unspecified 05/02/2013   PFIZER(Purple Top)SARS-COV-2 Vaccination 10/02/2019, 10/25/2019   Pneumococcal-Unspecified 08/15/1997   Td 08/15/2006   Tdap 09/15/2012   Health Maintenance  Topic Date Due   Zoster Vaccines- Shingrix (1 of 2) Never done   PAP SMEAR-Modifier  03/02/2018   COVID-19 Vaccine (3 - Pfizer risk series) 11/22/2019   INFLUENZA VACCINE  03/15/2022   DTaP/Tdap/Td (3 - Td or Tdap) 09/15/2022   COLONOSCOPY (Pts 45-89yr Insurance coverage will need to be confirmed)  03/23/2023   DEXA SCAN  05/08/2023   MAMMOGRAM  05/21/2023   HPV VACCINES  Aged Out   Hepatitis C Screening  Discontinued   HIV Screening  Discontinued    LMP: Patient has had an ablation. Pap: ? 02/2020 norm, Dr. RJuan Quamoffice annually  MGM: getting at breast center DEXA: getting q2y, osteopenia, R fem t-1.6  Last Dental Exam: 2022, goes q623mDr. KrChristie NottinghamLast Eye Exam: 2022 Dr. HePayton Emeraldffice, usually sees Dr. GoDelman Cheadlekin exam: 2019, Dr. LoUbaldo Glassingno concerns  Patient Care Team: McUnk PintoMD as PCP - General (Internal Medicine) CoLiane ComberNP as Nurse Practitioner (Nurse Practitioner) CoDelfina RedwoodPhysician Assistant)  Surgical History:  She has a past surgical history that includes Gynecologic cryosurgery; Novasure ablation; Oophorectomy (Right); Breast enhancement surgery (Bilateral, 1991); Parotidectomy (Left, 09/2008); Breast biopsy (Left, 2018); Tooth  extraction (Right, 05/24/2018); Augmentation mammaplasty (Bilateral); Shoulder adhesion release (Right, 01/14/2016); Shoulder adhesion release (Left, 2012); and Shoulder arthroscopy (Right, 05/30/2020). Family History:  Herfamily history includes Breast cancer in her maternal aunt, maternal grandmother, and paternal aunt; Cancer in her father; Depression in her mother; Diabetes in her brother; Heart disease in her father, maternal grandfather, and paternal grandfather; Hypertension in her father; Stroke in her paternal grandfather; Suicidality in her paternal grandmother; Tongue cancer in her father. Social History:  She reports that she has never smoked. She has never used smokeless tobacco. She reports current alcohol use. She reports that she does not  use drugs.   Review of Systems: Review of Systems  Constitutional:  Negative for chills, diaphoresis, fever, malaise/fatigue and weight loss.  HENT:  Negative for ear pain, hearing loss and tinnitus.   Eyes:  Negative for blurred vision, double vision, photophobia and pain.  Respiratory:  Negative for cough, sputum production, shortness of breath and wheezing.   Cardiovascular:  Negative for chest pain, palpitations, orthopnea, claudication and leg swelling.  Gastrointestinal:  Negative for abdominal pain, blood in stool, constipation (improved with magnesium), diarrhea, heartburn, melena, nausea and vomiting.  Genitourinary: Negative.  Negative for dysuria and urgency.  Musculoskeletal:  Negative for back pain (intermittent lower back/sacral pain), joint pain, myalgias and neck pain.  Skin:  Negative for rash.  Neurological:  Negative for dizziness, tingling, sensory change, weakness and headaches.  Endo/Heme/Allergies:  Positive for environmental allergies. Negative for polydipsia.  Psychiatric/Behavioral:  Negative for depression, memory loss, substance abuse and suicidal ideas. The patient is not nervous/anxious and does not have insomnia  (managed with med from GYN).   All other systems reviewed and are negative.   Physical Exam: Estimated body mass index is 24.4 kg/m as calculated from the following:   Height as of 03/31/22: 5' 4.5" (1.638 m).   Weight as of 03/31/22: 144 lb 6.4 oz (65.5 kg). There were no vitals taken for this visit. General Appearance: Well nourished, in no apparent distress.  Eyes: PERRLA, EOMs, conjunctiva no swelling or erythema Sinuses: No Frontal/maxillary tenderness  ENT/Mouth: Ext aud canals clear, normal light reflex with TMs without erythema, bulging. Good dentition. No erythema, swelling, or exudate on post pharynx. Tonsils not swollen or erythematous. Hearing normal.  Neck: Supple, thyroid normal. No bruits  Respiratory: Respiratory effort normal, BS equal bilaterally without rales, rhonchi, wheezing or stridor.  Cardio: RRR without murmurs, rubs or gallops. Distal pulses symmetrical and intact.  Chest: symmetric, with normal excursions and percussion.  Breasts: Defer to GYN, mmg annually Abdomen: Soft, BS x 4, no tenderness, no guarding, rebound, hernias, masses, or organomegaly.  Lymphatics: Non tender without lymphadenopathy.  Genitourinary: Defer to GYN Musculoskeletal: Full ROM all peripheral extremities,5/5 strength, and normal gait.  Skin: Warm, dry without rashes, ecchymosis, lesions.  Neuro: Cranial nerves intact, reflexes diminished throughout though equal bilaterally. Normal muscle tone, no cerebellar symptoms. Sensation intact.  Psych: Awake and oriented X 3, normal affect, Insight and Judgment appropriate.   EKG: WNL no ST changes in 2020, low risk, defer  Lachell Rochette Mikki Santee, NP 3:29 PM [facility]

## 2022-09-12 ENCOUNTER — Encounter: Payer: 59 | Admitting: Nurse Practitioner

## 2022-09-12 DIAGNOSIS — F988 Other specified behavioral and emotional disorders with onset usually occurring in childhood and adolescence: Secondary | ICD-10-CM

## 2022-09-12 DIAGNOSIS — G43009 Migraine without aura, not intractable, without status migrainosus: Secondary | ICD-10-CM

## 2022-09-12 DIAGNOSIS — Z1329 Encounter for screening for other suspected endocrine disorder: Secondary | ICD-10-CM

## 2022-09-12 DIAGNOSIS — Z136 Encounter for screening for cardiovascular disorders: Secondary | ICD-10-CM

## 2022-09-12 DIAGNOSIS — Z6824 Body mass index (BMI) 24.0-24.9, adult: Secondary | ICD-10-CM

## 2022-09-12 DIAGNOSIS — Z79899 Other long term (current) drug therapy: Secondary | ICD-10-CM

## 2022-09-12 DIAGNOSIS — M545 Low back pain, unspecified: Secondary | ICD-10-CM

## 2022-09-12 DIAGNOSIS — G4700 Insomnia, unspecified: Secondary | ICD-10-CM

## 2022-09-12 DIAGNOSIS — K5909 Other constipation: Secondary | ICD-10-CM

## 2022-09-12 DIAGNOSIS — Z1389 Encounter for screening for other disorder: Secondary | ICD-10-CM

## 2022-09-12 DIAGNOSIS — G4762 Sleep related leg cramps: Secondary | ICD-10-CM

## 2022-09-12 DIAGNOSIS — E559 Vitamin D deficiency, unspecified: Secondary | ICD-10-CM

## 2022-09-12 DIAGNOSIS — Z131 Encounter for screening for diabetes mellitus: Secondary | ICD-10-CM

## 2022-09-12 DIAGNOSIS — E782 Mixed hyperlipidemia: Secondary | ICD-10-CM

## 2022-09-12 DIAGNOSIS — F3340 Major depressive disorder, recurrent, in remission, unspecified: Secondary | ICD-10-CM

## 2022-09-12 DIAGNOSIS — N182 Chronic kidney disease, stage 2 (mild): Secondary | ICD-10-CM

## 2022-09-12 DIAGNOSIS — Z9109 Other allergy status, other than to drugs and biological substances: Secondary | ICD-10-CM

## 2022-09-15 ENCOUNTER — Encounter: Payer: 59 | Admitting: Nurse Practitioner

## 2022-09-16 ENCOUNTER — Other Ambulatory Visit: Payer: Self-pay | Admitting: Nurse Practitioner

## 2022-09-16 MED ORDER — HYOSCYAMINE SULFATE 0.125 MG PO TBDP: 0.1250 mg | ORAL_TABLET | ORAL | 0 refills | Status: DC | PRN

## 2022-09-19 ENCOUNTER — Encounter: Payer: Self-pay | Admitting: Nurse Practitioner

## 2022-09-19 ENCOUNTER — Other Ambulatory Visit: Payer: Self-pay | Admitting: Nurse Practitioner

## 2022-09-19 DIAGNOSIS — E119 Type 2 diabetes mellitus without complications: Secondary | ICD-10-CM

## 2022-09-19 MED ORDER — MOUNJARO 10 MG/0.5ML ~~LOC~~ SOAJ: 10.0000 mg | SUBCUTANEOUS | 3 refills | Status: DC

## 2022-09-26 ENCOUNTER — Encounter: Payer: 59 | Admitting: Nurse Practitioner

## 2022-10-11 NOTE — Progress Notes (Deleted)
Complete Physical  Assessment and Plan:  Carla Fuller was seen today for annual exam.  Diagnoses and all orders for this visit:  Encounter for Annual Physical Exam with abnormal findings Due annually  Health Maintenance reviewed Healthy lifestyle reviewed and goals set  Follows with GYN Dr. [name] Mammograms at breast center Check with insurance about shingrix  Hyperlipidemia Mild, treated by lifestyle interventions Continue low cholesterol diet and exercise.  Check lipid panel.  -     Lipid panel  Attention deficit disorder (ADD) without hyperactivity Continue medications PRN Helps with focus, no AE's. The patient was counseled on the addictive nature of the medication and was encouraged to take drug holidays when not needed.   Chronic low back pain, unspecified back pain laterality, with sciatica presence unspecified       -     Chronic tailbone pain; managed by avoidance of pressure and medications as needed. Sees ortho.  - check CRP per patient pref  Migraine without aura and without status migrainosus, not intractable Recently improved post tooth extration, well managed by OTC analgesics PRN -     Magnesium  Moderate episode of recurrent major depressive disorder in remission [facility]) She reports off of medications and doing much better; remission; monitor; failed numerous in the past, most recently on pristiq which worked well  Lifestyle discussed: diet/exerise, sleep hygiene, stress management, hydration  Insomnia, unspecified type       -      Moderately well managed by current medications, ambien via Dr. Nori Riis and melatonin  Overweight- BMI 27 Long discussion about weight loss, diet, and exercise Recommended diet heavy in fruits and veggies and low in animal meats, cheeses, and dairy products, appropriate calorie intake Patient will work on exercise, particularly weight lifting and resistance exercise Excellent progress with mounjaro - sent in 15 mg/week dose High fiber  and protein diet, avoid processed carbs, add regular resistance exercises  Follow up at next visit  Vitamin D deficiency -     VITAMIN D 25 Hydroxy (Vit-D Deficiency, Fractures)  Allergic state, sequela      -      Well managed by current medications  Nocturnal leg cramps ABI was normal, CBC, iron has been normal  Doing well with requip/magnesium/heat; increase walking  Medication management -     CBC with Differential/Platelet -     CMP/GFR  Screening for cardiovascular condition -     Lipid panel -     EKG 12-Lead - defer  Screening for hematuria or proteinuria - R  Screening for thyroid disorder -     TSH  Type 2 Diabetes Mellitus without complications Continue medications:Mounjaro Continue diet and exercise.  Perform daily foot/skin check, notify office of any concerning changes.  Check A1C   Chronic kidney disease (CKD), stage II (mild) -     COMPLETE METABOLIC PANEL WITH GFR  Chronic constipation Improved with "calm" magnesium Encouraged high fiber diet, hydration, regular exercise    No orders of the defined types were placed in this encounter.    Discussed med's effects and SE's. Screening labs and tests as requested with regular follow-up as recommended. Over 40 minutes of exam, counseling, chart review, and complex, high level critical decision making was performed this visit.   Future Appointments  Date Time Provider [place]  10/12/2022  2:00 PM Alycia Rossetti, NP [facility] None    HPI  57 y.o. married Caucasian female  presents for a complete physical and follow up for has  Insomnia; Vitamin D deficiency; ADD (attention deficit disorder); Migraines; Allergy; Chronic lumbar pain; Nocturnal leg cramps; Recurrent major depression in remission ([place]); BMI 24.0-24.9, adult; Osteopenia; Mixed hyperlipidemia; Chronic constipation; B12 deficiency; and Hyperinsulinemia on their problem list..   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 Enjoys spending time with grandkids.   Follows with GYN - Dr. [name] - for PAPs. He is prescribing estradiol and progesterone. She is on bASA. Gets mammograms at breast center.   She had fall with R shoulder injury, repeated dislocations, underwent arthroscopic repair 05/30/2020 by Dr. Tamera Punt, completed PT and essentially resolved.   Hx of depression in remission, did well on pristiq at one point.  Patient is on an ADD medication, adderall 20 mg on work days PRN, denies SE and works when needed. Currently taking prolonged break.   She has dx of moderate depression, has failed many agents, lexapro, zoloft, wellbutrin, did well with pristiq for 18 months but stopped and reprots is actually doing fairly with just her night time sleeping medication (ambien 5 mg via Dr. [name]).   She reports she has been followed by ortho for cervical facet issues that seem to be contributing to "migraines" - tension headaches, triggered by stress, sinus. Takes tylenol, sinus med PRN, occasional nurtec samples.   She has chronic intermittent lumbar pain attributed to sedentary job, ortho follows. Also restless legs, "calm" magnesium gummies have helped. Heating blanket at night. Takes requip 2 mg in the evening if needed.   BMI is There is no height or weight on file to calculate BMI., she has been working on diet; she is active with her grandkids. Weight has been an ongoing struggle. She has failed phentermine/topamax., did get on wegovy with samples last year with good success though with insurance coverage issues. She has been on mounjaro with coupon, now on 12.5 mg, tolerates well and down over 20 lb on home scale, takes magnesium supplement for constipation.  Wt Readings from Last 3 Encounters:  03/31/22 144 lb 6.4 oz (65.5 kg)  03/23/22 145 lb 9.6 oz (66 kg)  03/14/22 146 lb 3.2 oz (66.3 kg)   Today their BP is     She does workout. She denies chest pain,  shortness of breath, dizziness.   She is not on cholesterol medication. Her cholesterol is not at goal. The cholesterol last visit was:   Lab Results  Component Value Date   CHOL 174 03/31/2022   HDL 47 (L) 03/31/2022   LDLCALC 111 (H) 03/31/2022   TRIG 71 03/31/2022   CHOLHDL 3.7 03/31/2022    Last A1C in the office was:  Lab Results  Component Value Date   HGBA1C 4.9 08/27/2020   Last GFR: Lab Results  Component Value Date   GFRNONAA 79 08/27/2020   Patient is on Vitamin D supplement and at goal, taking reduced dose vit D 2000 IU with K2 combo.  Lab Results  Component Value Date   VD25OH 66 09/08/2021     She reports is not currently on a supplement -  Lab Results  Component Value Date   [id number] 09/08/2021      Current Medications:  Current Outpatient Medications on File Prior to Visit  Medication Sig Dispense Refill   B Complex-Biotin-FA (B-COMPLEX PO) Take by mouth.     Cholecalciferol (VITAMIN D PO) Take 10,000 Units by mouth.      ezetimibe (ZETIA) 10 MG tablet Take 1 tablet (  10 mg total) by mouth daily. 30 tablet 11   fluconazole (DIFLUCAN) 150 MG tablet Take 1 tab day 1 and day 4 2 tablet 1   hyoscyamine (ANASPAZ) 0.125 MG TBDP disintergrating tablet Place 1 tablet (0.125 mg total) under the tongue every 4 (four) hours as needed. 60 tablet 0   lidocaine (LIDODERM) 5 % Apply 1 patch every 12 hours. Remove & Discard patch within 12 hours or as directed by MD 6 patch 0   MAGNESIUM PO Take by mouth. Takess 4 gummies daily     Menaquinone-7 (VITAMIN K2 PO) Take by mouth.     methocarbamol (ROBAXIN) 500 MG tablet TAKE 1 TABLET(500 MG) BY MOUTH EVERY 6 HOURS AS NEEDED FOR MUSCLE SPASMS 60 tablet 1   omeprazole (PRILOSEC) 20 MG capsule Take 20 mg by mouth daily.     ondansetron (ZOFRAN) 4 MG tablet Take 1 tablet (4 mg total) by mouth daily as needed for nausea or vomiting. 30 tablet 1   tirzepatide (MOUNJARO) 10 MG/0.5ML Pen Inject 10 mg into the skin once a  week. 2 mL 3   No current facility-administered medications on file prior to visit.   Allergies:  Allergies  Allergen Reactions   Percocet [Oxycodone-Acetaminophen]    Medical History:  She has Insomnia; Vitamin D deficiency; ADD (attention deficit disorder); Migraines; Allergy; Chronic lumbar pain; Nocturnal leg cramps; Recurrent major depression in remission ([place]); BMI 24.0-24.9, adult; Osteopenia; Mixed hyperlipidemia; Chronic constipation; B12 deficiency; and Hyperinsulinemia on their problem list.  Health Maintenance:   Immunization History  Administered Date(s) Administered   Influenza Inj Mdck Quad With Preservative 05/16/2018, 05/22/2019, 07/07/2020   Influenza Split 05/09/2014   Influenza,inj,Quad PF,6+ Mos 05/31/2021   Influenza-Unspecified 05/02/2013   PFIZER(Purple Top)SARS-COV-2 Vaccination 10/02/2019, 10/25/2019   Pneumococcal-Unspecified 08/15/1997   Td 08/15/2006   Tdap 09/15/2012   Health Maintenance  Topic Date Due   Zoster Vaccines- Shingrix (1 of 2) Never done   PAP SMEAR-Modifier  03/02/2018   COVID-19 Vaccine (3 - Pfizer risk series) 11/22/2019   INFLUENZA VACCINE  03/15/2022   DTaP/Tdap/Td (3 - Td or Tdap) 09/15/2022   COLONOSCOPY (Pts 45-78yr Insurance coverage will need to be confirmed)  03/23/2023   DEXA SCAN  05/08/2023   MAMMOGRAM  05/21/2023   HPV VACCINES  Aged Out   Hepatitis C Screening  Discontinued   HIV Screening  Discontinued    LMP: Patient has had an ablation. Pap: ? 02/2020 norm, Dr. RJuan Quamoffice annually  MGM: getting at breast center DEXA: getting q2y, osteopenia, R fem t-1.6  Last Dental Exam: 2022, goes q633mDr. KrChristie NottinghamLast Eye Exam: 2022 Dr. HePayton Emeraldffice, usually sees Dr. GoDelman Cheadlekin exam: 2019, Dr. LoUbaldo Glassingno concerns  Patient Care Team: McUnk PintoMD as PCP - General (Internal Medicine) CoLiane ComberNP as Nurse Practitioner (Nurse Practitioner) CoDelfina RedwoodPhysician Assistant)  Surgical  History:  She has a past surgical history that includes Gynecologic cryosurgery; Novasure ablation; Oophorectomy (Right); Breast enhancement surgery (Bilateral, 1991); Parotidectomy (Left, 09/2008); Breast biopsy (Left, 2018); Tooth extraction (Right, 05/24/2018); Augmentation mammaplasty (Bilateral); Shoulder adhesion release (Right, 01/14/2016); Shoulder adhesion release (Left, 2012); and Shoulder arthroscopy (Right, 05/30/2020). Family History:  Herfamily history includes Breast cancer in her maternal aunt, maternal grandmother, and paternal aunt; Cancer in her father; Depression in her mother; Diabetes in her brother; Heart disease in her father, maternal grandfather, and paternal grandfather; Hypertension in her father; Stroke in her paternal grandfather; Suicidality in her paternal grandmother;  Tongue cancer in her father. Social History:  She reports that she has never smoked. She has never used smokeless tobacco. She reports current alcohol use. She reports that she does not use drugs.   Review of Systems: Review of Systems  Constitutional:  Negative for chills, diaphoresis, fever, malaise/fatigue and weight loss.  HENT:  Negative for ear pain, hearing loss and tinnitus.   Eyes:  Negative for blurred vision, double vision, photophobia and pain.  Respiratory:  Negative for cough, sputum production, shortness of breath and wheezing.   Cardiovascular:  Negative for chest pain, palpitations, orthopnea, claudication and leg swelling.  Gastrointestinal:  Negative for abdominal pain, blood in stool, constipation (improved with magnesium), diarrhea, heartburn, melena, nausea and vomiting.  Genitourinary: Negative.  Negative for dysuria and urgency.  Musculoskeletal:  Negative for back pain (intermittent lower back/sacral pain), joint pain, myalgias and neck pain.  Skin:  Negative for rash.  Neurological:  Negative for dizziness, tingling, sensory change, weakness and headaches.   Endo/Heme/Allergies:  Positive for environmental allergies. Negative for polydipsia.  Psychiatric/Behavioral:  Negative for depression, memory loss, substance abuse and suicidal ideas. The patient is not nervous/anxious and does not have insomnia (managed with med from GYN).   All other systems reviewed and are negative.   Physical Exam: Estimated body mass index is 24.4 kg/m as calculated from the following:   Height as of 03/31/22: 5' 4.5" (1.638 m).   Weight as of 03/31/22: 144 lb 6.4 oz (65.5 kg). There were no vitals taken for this visit. General Appearance: Well nourished, in no apparent distress.  Eyes: PERRLA, EOMs, conjunctiva no swelling or erythema Sinuses: No Frontal/maxillary tenderness  ENT/Mouth: Ext aud canals clear, normal light reflex with TMs without erythema, bulging. Good dentition. No erythema, swelling, or exudate on post pharynx. Tonsils not swollen or erythematous. Hearing normal.  Neck: Supple, thyroid normal. No bruits  Respiratory: Respiratory effort normal, BS equal bilaterally without rales, rhonchi, wheezing or stridor.  Cardio: RRR without murmurs, rubs or gallops. Distal pulses symmetrical and intact.  Chest: symmetric, with normal excursions and percussion.  Breasts: Defer to GYN, mmg annually Abdomen: Soft, BS x 4, no tenderness, no guarding, rebound, hernias, masses, or organomegaly.  Lymphatics: Non tender without lymphadenopathy.  Genitourinary: Defer to GYN Musculoskeletal: Full ROM all peripheral extremities,5/5 strength, and normal gait.  Skin: Warm, dry without rashes, ecchymosis, lesions.  Neuro: Cranial nerves intact, reflexes diminished throughout though equal bilaterally. Normal muscle tone, no cerebellar symptoms. Sensation intact.  Psych: Awake and oriented X 3, normal affect, Insight and Judgment appropriate.   EKG: WNL no ST changes in 2020, low risk, defer  Carla Mcfate Mikki Santee, NP 9:06 AM [facility]

## 2022-10-12 ENCOUNTER — Encounter: Payer: 59 | Admitting: Nurse Practitioner

## 2022-11-01 ENCOUNTER — Other Ambulatory Visit: Payer: Self-pay | Admitting: Nurse Practitioner

## 2022-11-01 DIAGNOSIS — M25552 Pain in left hip: Secondary | ICD-10-CM

## 2022-11-01 DIAGNOSIS — R202 Paresthesia of skin: Secondary | ICD-10-CM

## 2022-11-02 ENCOUNTER — Other Ambulatory Visit: Payer: Self-pay | Admitting: Nurse Practitioner

## 2022-11-02 MED ORDER — "INSULIN SYRINGE-NEEDLE U-100 31G X 5/16"" 0.3 ML MISC": 0 refills | Status: DC

## 2022-11-08 ENCOUNTER — Ambulatory Visit (INDEPENDENT_AMBULATORY_CARE_PROVIDER_SITE_OTHER): Payer: 59 | Admitting: Nurse Practitioner

## 2022-11-08 ENCOUNTER — Encounter: Payer: Self-pay | Admitting: Nurse Practitioner

## 2022-11-08 VITALS — BP 98/64 | HR 89 | Temp 97.7°F | Ht 64.5 in | Wt 148.8 lb

## 2022-11-08 DIAGNOSIS — M62838 Other muscle spasm: Secondary | ICD-10-CM

## 2022-11-08 DIAGNOSIS — M25552 Pain in left hip: Secondary | ICD-10-CM

## 2022-11-08 NOTE — Progress Notes (Deleted)
Complete Physical  Assessment and Plan:  Chayanne was seen today for annual exam.  Diagnoses and all orders for this visit:  Encounter for Annual Physical Exam with abnormal findings Due annually  Health Maintenance reviewed Healthy lifestyle reviewed and goals set  Follows with GYN Dr. [name] Mammograms at breast center Check with insurance about shingrix  Hyperlipidemia Mild, treated by lifestyle interventions Continue low cholesterol diet and exercise.  Check lipid panel.  -     Lipid panel  Attention deficit disorder (ADD) without hyperactivity Continue medications PRN Helps with focus, no AE's. The patient was counseled on the addictive nature of the medication and was encouraged to take drug holidays when not needed.   Chronic low back pain, unspecified back pain laterality, with sciatica presence unspecified       -     Chronic tailbone pain; managed by avoidance of pressure and medications as needed. Sees ortho.  - check CRP per patient pref  Migraine without aura and without status migrainosus, not intractable Recently improved post tooth extration, well managed by OTC analgesics PRN -     Magnesium  Moderate episode of recurrent major depressive disorder in remission [facility]) She reports off of medications and doing much better; remission; monitor; failed numerous in the past, most recently on pristiq which worked well  Lifestyle discussed: diet/exerise, sleep hygiene, stress management, hydration  Insomnia, unspecified type       -      Moderately well managed by current medications, ambien via Dr. Nori Riis and melatonin  Overweight- BMI 27 Long discussion about weight loss, diet, and exercise Recommended diet heavy in fruits and veggies and low in animal meats, cheeses, and dairy products, appropriate calorie intake Patient will work on exercise, particularly weight lifting and resistance exercise Excellent progress with mounjaro - sent in 15 mg/week dose High fiber  and protein diet, avoid processed carbs, add regular resistance exercises  Follow up at next visit  Vitamin D deficiency -     VITAMIN D 25 Hydroxy (Vit-D Deficiency, Fractures)  Allergic state, sequela      -      Well managed by current medications  Nocturnal leg cramps ABI was normal, CBC, iron has been normal  Doing well with requip/magnesium/heat; increase walking  Medication management -     CBC with Differential/Platelet -     CMP/GFR  Screening for ischemic heart disease -     EKG 12-Lead - defer  Screening for hematuria or proteinuria - Routine urine with reflex microscopic - Microalbumin/creatinine urine ratio  Screening for thyroid disorder -     TSH  Screening for AAA - U/S ABD Retroperitoneal LTD  Type 2 Diabetes Mellitus without complications Continue medications:Mounjaro Continue diet and exercise.  Perform daily foot/skin check, notify office of any concerning changes.  Check A1C   Chronic kidney disease (CKD), stage II (mild) -     COMPLETE METABOLIC PANEL WITH GFR  Chronic constipation Improved with "calm" magnesium Encouraged high fiber diet, hydration, regular exercise    No orders of the defined types were placed in this encounter.    Discussed med's effects and SE's. Screening labs and tests as requested with regular follow-up as recommended. Over 40 minutes of exam, counseling, chart review, and complex, high level critical decision making was performed this visit.   Future Appointments  Date Time Provider [place]  11/08/2022  3:30 PM Alycia Rossetti, NP [facility] None  11/09/2022  9:00 AM Alycia Rossetti, NP [facility]  None    HPI  57 y.o. married Caucasian female  presents for a complete physical and follow up for has Insomnia; Vitamin D deficiency; ADD (attention deficit disorder); Migraines; Allergy; Chronic lumbar pain; Nocturnal leg cramps; Recurrent major depression in remission ([place]); BMI 24.0-24.9, adult;  Osteopenia; Mixed hyperlipidemia; Chronic constipation; B12 deficiency; and Hyperinsulinemia on their problem list..   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 Enjoys spending time with grandkids.   Follows with GYN - Dr. [name] - for PAPs. He is prescribing estradiol and progesterone. She is on bASA. Gets mammograms at breast center.   She had fall with R shoulder injury, repeated dislocations, underwent arthroscopic repair 05/30/2020 by Dr. Tamera Punt, completed PT and essentially resolved.   Hx of depression in remission, did well on pristiq at one point.  Patient is on an ADD medication, adderall 20 mg on work days PRN, denies SE and works when needed. Currently taking prolonged break.   She has dx of moderate depression, has failed many agents, lexapro, zoloft, wellbutrin, did well with pristiq for 18 months but stopped and reprots is actually doing fairly with just her night time sleeping medication (ambien 5 mg via Dr. [name]).   She reports she has been followed by ortho for cervical facet issues that seem to be contributing to "migraines" - tension headaches, triggered by stress, sinus. Takes tylenol, sinus med PRN, occasional nurtec samples.   She has chronic intermittent lumbar pain attributed to sedentary job, ortho follows. Also restless legs, "calm" magnesium gummies have helped. Heating blanket at night. Takes requip 2 mg in the evening if needed.   BMI is There is no height or weight on file to calculate BMI., she has been working on diet; she is active with her grandkids. Weight has been an ongoing struggle. She has failed phentermine/topamax., did get on wegovy with samples last year with good success though with insurance coverage issues. She has been on mounjaro with coupon, now on 12.5 mg, tolerates well and down over 20 lb on home scale, takes magnesium supplement for constipation.  Wt Readings from Last 3 Encounters:   03/31/22 144 lb 6.4 oz (65.5 kg)  03/23/22 145 lb 9.6 oz (66 kg)  03/14/22 146 lb 3.2 oz (66.3 kg)   Today their BP is     She does workout. She denies chest pain, shortness of breath, dizziness.   She is not on cholesterol medication. Her cholesterol is not at goal. The cholesterol last visit was:   Lab Results  Component Value Date   CHOL 174 03/31/2022   HDL 47 (L) 03/31/2022   LDLCALC 111 (H) 03/31/2022   TRIG 71 03/31/2022   CHOLHDL 3.7 03/31/2022    Last A1C in the office was:  Lab Results  Component Value Date   HGBA1C 4.9 08/27/2020   Last GFR: Lab Results  Component Value Date   GFRNONAA 79 08/27/2020   Patient is on Vitamin D supplement and at goal, taking reduced dose vit D 2000 IU with K2 combo.  Lab Results  Component Value Date   VD25OH 36 09/08/2021     She reports is not currently on a supplement -  Lab Results  Component Value Date   [id number] 09/08/2021      Current Medications:  Current Outpatient Medications on File Prior to Visit  Medication Sig Dispense Refill   B Complex-Biotin-FA (B-COMPLEX PO) Take by mouth.  Cholecalciferol (VITAMIN D PO) Take 10,000 Units by mouth.      ezetimibe (ZETIA) 10 MG tablet Take 1 tablet (10 mg total) by mouth daily. 30 tablet 11   fluconazole (DIFLUCAN) 150 MG tablet Take 1 tab day 1 and day 4 2 tablet 1   hyoscyamine (ANASPAZ) 0.125 MG TBDP disintergrating tablet Place 1 tablet (0.125 mg total) under the tongue every 4 (four) hours as needed. 60 tablet 0   Insulin Syringe-Needle U-100 31G X 5/16" 0.3 ML MISC Use 1 daily 100 each 0   lidocaine (LIDODERM) 5 % Apply 1 patch every 12 hours. Remove & Discard patch within 12 hours or as directed by MD 6 patch 0   MAGNESIUM PO Take by mouth. Takess 4 gummies daily     Menaquinone-7 (VITAMIN K2 PO) Take by mouth.     methocarbamol (ROBAXIN) 500 MG tablet TAKE 1 TABLET(500 MG) BY MOUTH EVERY 6 HOURS AS NEEDED FOR MUSCLE SPASMS 60 tablet 1   omeprazole  (PRILOSEC) 20 MG capsule Take 20 mg by mouth daily.     ondansetron (ZOFRAN) 4 MG tablet Take 1 tablet (4 mg total) by mouth daily as needed for nausea or vomiting. 30 tablet 1   tirzepatide (MOUNJARO) 10 MG/0.5ML Pen Inject 10 mg into the skin once a week. 2 mL 3   No current facility-administered medications on file prior to visit.   Allergies:  Allergies  Allergen Reactions   Percocet [Oxycodone-Acetaminophen]    Medical History:  She has Insomnia; Vitamin D deficiency; ADD (attention deficit disorder); Migraines; Allergy; Chronic lumbar pain; Nocturnal leg cramps; Recurrent major depression in remission ([place]); BMI 24.0-24.9, adult; Osteopenia; Mixed hyperlipidemia; Chronic constipation; B12 deficiency; and Hyperinsulinemia on their problem list.  Health Maintenance:   Immunization History  Administered Date(s) Administered   Influenza Inj Mdck Quad With Preservative 05/16/2018, 05/22/2019, 07/07/2020   Influenza Split 05/09/2014   Influenza,inj,Quad PF,6+ Mos 05/31/2021   Influenza-Unspecified 05/02/2013   PFIZER(Purple Top)SARS-COV-2 Vaccination 10/02/2019, 10/25/2019   Pneumococcal-Unspecified 08/15/1997   Td 08/15/2006   Tdap 09/15/2012   Health Maintenance  Topic Date Due   Zoster Vaccines- Shingrix (1 of 2) Never done   PAP SMEAR-Modifier  03/02/2018   COVID-19 Vaccine (3 - Pfizer risk series) 11/22/2019   INFLUENZA VACCINE  03/15/2022   Diabetic kidney evaluation - Urine ACR  09/08/2022   DTaP/Tdap/Td (3 - Td or Tdap) 09/15/2022   Diabetic kidney evaluation - eGFR measurement  02/25/2023   COLONOSCOPY (Pts 45-65yrs Insurance coverage will need to be confirmed)  03/23/2023   DEXA SCAN  05/08/2023   MAMMOGRAM  05/21/2023   HPV VACCINES  Aged Out   Hepatitis C Screening  Discontinued   HIV Screening  Discontinued    LMP: Patient has had an ablation. Pap: ? 02/2020 norm, Dr. Juan Quam office annually  MGM: getting at breast center DEXA: getting q2y, osteopenia, R  fem t-1.6  Last Dental Exam: 2022, goes q67m, Dr. Christie Nottingham  Last Eye Exam: 2022 Dr. Payton Emerald office, usually sees Dr. Delman Cheadle Skin exam: 2019, Dr. Ubaldo Glassing, no concerns  Patient Care Team: Unk Pinto, MD as PCP - General (Internal Medicine) Liane Comber, NP as Nurse Practitioner (Nurse Practitioner) Delfina Redwood (Physician Assistant)  Surgical History:  She has a past surgical history that includes Gynecologic cryosurgery; Novasure ablation; Oophorectomy (Right); Breast enhancement surgery (Bilateral, 1991); Parotidectomy (Left, 09/2008); Breast biopsy (Left, 2018); Tooth extraction (Right, 05/24/2018); Augmentation mammaplasty (Bilateral); Shoulder adhesion release (Right, 01/14/2016); Shoulder adhesion release (Left,  2012); and Shoulder arthroscopy (Right, 05/30/2020). Family History:  Herfamily history includes Breast cancer in her maternal aunt, maternal grandmother, and paternal aunt; Cancer in her father; Depression in her mother; Diabetes in her brother; Heart disease in her father, maternal grandfather, and paternal grandfather; Hypertension in her father; Stroke in her paternal grandfather; Suicidality in her paternal grandmother; Tongue cancer in her father. Social History:  She reports that she has never smoked. She has never used smokeless tobacco. She reports current alcohol use. She reports that she does not use drugs.   Review of Systems: Review of Systems  Constitutional:  Negative for chills, diaphoresis, fever, malaise/fatigue and weight loss.  HENT:  Negative for ear pain, hearing loss and tinnitus.   Eyes:  Negative for blurred vision, double vision, photophobia and pain.  Respiratory:  Negative for cough, sputum production, shortness of breath and wheezing.   Cardiovascular:  Negative for chest pain, palpitations, orthopnea, claudication and leg swelling.  Gastrointestinal:  Negative for abdominal pain, blood in stool, constipation (improved with magnesium),  diarrhea, heartburn, melena, nausea and vomiting.  Genitourinary: Negative.  Negative for dysuria and urgency.  Musculoskeletal:  Negative for back pain (intermittent lower back/sacral pain), joint pain, myalgias and neck pain.  Skin:  Negative for rash.  Neurological:  Negative for dizziness, tingling, sensory change, weakness and headaches.  Endo/Heme/Allergies:  Positive for environmental allergies. Negative for polydipsia.  Psychiatric/Behavioral:  Negative for depression, memory loss, substance abuse and suicidal ideas. The patient is not nervous/anxious and does not have insomnia (managed with med from GYN).   All other systems reviewed and are negative.   Physical Exam: Estimated body mass index is 24.4 kg/m as calculated from the following:   Height as of 03/31/22: 5' 4.5" (1.638 m).   Weight as of 03/31/22: 144 lb 6.4 oz (65.5 kg). There were no vitals taken for this visit. General Appearance: Well nourished, in no apparent distress.  Eyes: PERRLA, EOMs, conjunctiva no swelling or erythema Sinuses: No Frontal/maxillary tenderness  ENT/Mouth: Ext aud canals clear, normal light reflex with TMs without erythema, bulging. Good dentition. No erythema, swelling, or exudate on post pharynx. Tonsils not swollen or erythematous. Hearing normal.  Neck: Supple, thyroid normal. No bruits  Respiratory: Respiratory effort normal, BS equal bilaterally without rales, rhonchi, wheezing or stridor.  Cardio: RRR without murmurs, rubs or gallops. Distal pulses symmetrical and intact.  Chest: symmetric, with normal excursions and percussion.  Breasts: Defer to GYN, mmg annually Abdomen: Soft, BS x 4, no tenderness, no guarding, rebound, hernias, masses, or organomegaly.  Lymphatics: Non tender without lymphadenopathy.  Genitourinary: Defer to GYN Musculoskeletal: Full ROM all peripheral extremities,5/5 strength, and normal gait.  Skin: Warm, dry without rashes, ecchymosis, lesions.  Neuro: Cranial  nerves intact, reflexes diminished throughout though equal bilaterally. Normal muscle tone, no cerebellar symptoms. Sensation intact.  Psych: Awake and oriented X 3, normal affect, Insight and Judgment appropriate.   EKG: WNL no ST changes in 2020, low risk, defer  Ghadeer Kastelic Mikki Santee, NP 12:17 PM [facility]

## 2022-11-08 NOTE — Progress Notes (Signed)
Assessment and Plan:  Carla Fuller was seen today for neck pain.  Diagnoses and all orders for this visit:  Left hip pain Continue to follow with orthopedics  Trapezius muscle spasm Use moist heat and Ibuprofen as needed. A trigger point injection was performed at the site of maximal tenderness using 1% plain Lidocaine and  10 mg of dexamethasone x 2 sites. This was well tolerated, and followed by moderate relief of pain.  -     PR INJECTION SINGLE/MLT TRIGGER POINT 1/2 MUSCLES       Further disposition pending results of labs. Discussed med's effects and SE's.   Over 30 minutes of exam, counseling, chart review, and critical decision making was performed.   Future Appointments  Date Time Provider [place]  11/09/2022  9:00 AM Alycia Rossetti, NP [facility] None    ------------------------------------------------------------------------------------------------------------------   HPI BP 98/64   Pulse 89   Temp 97.7 F (36.5 C)   Ht 5' 4.5" (1.638 m)   Wt 148 lb 12.8 oz (67.5 kg)   SpO2 99%   BMI 25.15 kg/m   57 y.o.female presents for pain left side of neck which occurred Saturday night, awoke her from sleep. Has also noted some tenderness on the right side as well.   Continues to have pain in left hip with paresthesias in both buttocks.  She was referred to ortho and has an upcoming MRI.   BP well controlled without medications BP Readings from Last 3 Encounters:  11/08/22 98/64  03/31/22 100/62  03/23/22 100/62   BMI is Body mass index is 25.15 kg/m., she has been working on diet and exercise. Wt Readings from Last 3 Encounters:  11/08/22 148 lb 12.8 oz (67.5 kg)  03/31/22 144 lb 6.4 oz (65.5 kg)  03/23/22 145 lb 9.6 oz (66 kg)     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   Chronic lumbar  pain    Sacrum   Insomnia    Migraines    Plantar fasciitis of right foot 06/21/2018   Unspecified vitamin D deficiency      Allergies  Allergen Reactions   Percocet [Oxycodone-Acetaminophen]     Current Outpatient Medications on File Prior to Visit  Medication Sig   B Complex-Biotin-FA (B-COMPLEX PO) Take by mouth.   Cholecalciferol (VITAMIN D PO) Take 10,000 Units by mouth.    estradiol (ESTRACE) 2 MG tablet Take 2 mg by mouth daily.   ezetimibe (ZETIA) 10 MG tablet Take 1 tablet (10 mg total) by mouth daily.   Insulin Syringe-Needle U-100 31G X 5/16" 0.3 ML MISC Use 1 daily   lidocaine (LIDODERM) 5 % Apply 1 patch every 12 hours. Remove & Discard patch within 12 hours or as directed by MD   MAGNESIUM PO Take by mouth. Takess 4 gummies daily   Menaquinone-7 (VITAMIN K2 PO) Take by mouth.   methocarbamol (ROBAXIN) 500 MG tablet TAKE 1 TABLET(500 MG) BY MOUTH EVERY 6 HOURS AS NEEDED FOR MUSCLE SPASMS   omeprazole (PRILOSEC) 20 MG capsule Take 20 mg by mouth daily.   ondansetron (ZOFRAN) 4 MG tablet Take 1 tablet (4 mg total) by mouth daily as needed for nausea or vomiting.   progesterone (PROMETRIUM) 100 MG capsule Take 100 mg by mouth at bedtime.  tirzepatide (MOUNJARO) 10 MG/0.5ML Pen Inject 10 mg into the skin once a week.   triamcinolone cream (KENALOG) 0.1 % APPLY SMALL AMOUNT TO THE AFFECTED AREA TWICE DAILY WHEN FLARING FOR 1 TO 2 WEEKS   zolpidem (AMBIEN) 10 MG tablet Take 10 mg by mouth at bedtime as needed.   fluconazole (DIFLUCAN) 150 MG tablet Take 1 tab day 1 and day 4 (Patient not taking: Reported on 11/08/2022)   hyoscyamine (ANASPAZ) 0.125 MG TBDP disintergrating tablet Place 1 tablet (0.125 mg total) under the tongue every 4 (four) hours as needed. (Patient not taking: Reported on 11/08/2022)   No current facility-administered medications on file prior to visit.    ROS: all negative except above.   Physical Exam:  BP 98/64   Pulse 89   Temp 97.7 F (36.5 C)    Ht 5' 4.5" (1.638 m)   Wt 148 lb 12.8 oz (67.5 kg)   SpO2 99%   BMI 25.15 kg/m   General Appearance: Well nourished, in no apparent distress. Eyes: PERRLA, EOMs, conjunctiva no swelling or erythema Sinuses: No Frontal/maxillary tenderness ENT/Mouth: Ext aud canals clear, TMs without erythema, bulging. No erythema, swelling, or exudate on post pharynx.  Tonsils not swollen or erythematous. Hearing normal.  Neck: Supple, thyroid normal.  Respiratory: Respiratory effort normal, BS equal bilaterally without rales, rhonchi, wheezing or stridor.  Cardio: RRR with no MRGs. Brisk peripheral pulses without edema.  Abdomen: Soft, + BS.  Non tender, no guarding, rebound, hernias, masses. Lymphatics: Non tender without lymphadenopathy.  Musculoskeletal: Full ROM, 5/5 strength, normal gait. Spasm noted of left and right trapezius Skin: Warm, dry without rashes, lesions, ecchymosis.  Neuro: Cranial nerves intact. Normal muscle tone, no cerebellar symptoms. Sensation intact.  Psych: Awake and oriented X 3, normal affect, Insight and Judgment appropriate.    A trigger point injection was performed at the site of maximal tenderness using 1% plain Lidocaine and  10 mg of dexamethasone x 2 sites. This was well tolerated, and followed by moderate relief of pain.   Alycia Rossetti, NP 3:46 PM [facility]

## 2022-11-08 NOTE — Patient Instructions (Signed)
Muscle Cramps and Spasms Muscle cramps and spasms occur when a muscle or muscles tighten and you have no control over this tightening (involuntary muscle contraction). They are a common problem that can happen in any muscle. The most common place is in the calf muscles of the leg. There are a few ways that muscle cramps and spasms differ: Muscle cramps are painful. They come and go and may last for a few seconds or up to 15 minutes. Muscle cramps are often more forceful and last longer than muscle spasms. Muscle spasms may or may not be painful. They may last just a few seconds or last much longer. Certain conditions, such as diabetes or Parkinson's disease, can make you more likely to have cramps or spasms. But in most cases, cramps and spasms are not caused by other conditions. Common causes include: Overexertion. This is when you do more physical work or exercise than your body is ready for. Overuse from doing the same movements too many times. Staying in one position for too long. Improper preparation, form, or technique when playing a sport or doing an activity. Not enough water or other fluids in your body (dehydration). Other causes may include: Injury. Side effects of some medicines. Too few salts and minerals in your body (electrolytes), such as potassium and calcium. This could happen if you are taking water pills (diuretics) or if you are pregnant. In many cases, the cause of muscle cramps or spasms is not known. Follow these instructions at home: Eating and drinking Drink enough fluid to keep your pee (urine) pale yellow. This can help prevent cramps or spasms. Eat a healthy diet that includes a lot of nutrients to help your muscles work. A healthy diet includes fruits and vegetables, lean protein, whole grains, and low-fat or nonfat dairy products. Managing pain and stiffness     Try to massage, stretch, and relax the affected muscle. Do this for a few minutes at a time. If told,  put ice on the muscles. This may help if you are sore or have pain after a cramp or spasm. Put ice in a plastic bag. Place a towel between your skin and the bag. Leave the ice on for 20 minutes, 2-3 times a day. If told, apply heat to tight or tense muscles as often as told by your health care provider. Use the heat source that your provider recommends, such as a moist heat pack or a heating pad. Place a towel between your skin and the heat source. Leave the heat on for 20-30 minutes. If your skin turns bright red, remove the ice or heat right away to prevent skin damage. The risk of damage is higher if you cannot feel pain, heat, or cold. Take hot showers or baths to help relax tight muscles. General instructions If you are having cramps often, avoid intense exercise for a few days. Take over-the-counter and prescription medicines only as told by your provider. Watch for any changes in your symptoms. Contact a health care provider if: Your cramps or spasms get more severe or happen more often. Your cramps or spasms do not get better over time. This information is not intended to replace advice given to you by your health care provider. Make sure you discuss any questions you have with your health care provider. Document Revised: 03/22/2022 Document Reviewed: 03/22/2022 Elsevier Patient Education  2023 [employer]  

## 2022-11-09 ENCOUNTER — Encounter: Payer: 59 | Admitting: Nurse Practitioner

## 2022-11-09 DIAGNOSIS — G4762 Sleep related leg cramps: Secondary | ICD-10-CM

## 2022-11-09 DIAGNOSIS — F988 Other specified behavioral and emotional disorders with onset usually occurring in childhood and adolescence: Secondary | ICD-10-CM

## 2022-11-09 DIAGNOSIS — E559 Vitamin D deficiency, unspecified: Secondary | ICD-10-CM

## 2022-11-09 DIAGNOSIS — G8929 Other chronic pain: Secondary | ICD-10-CM

## 2022-11-09 DIAGNOSIS — Z1389 Encounter for screening for other disorder: Secondary | ICD-10-CM

## 2022-11-09 DIAGNOSIS — G43009 Migraine without aura, not intractable, without status migrainosus: Secondary | ICD-10-CM

## 2022-11-09 DIAGNOSIS — Z0001 Encounter for general adult medical examination with abnormal findings: Secondary | ICD-10-CM

## 2022-11-09 DIAGNOSIS — Z6824 Body mass index (BMI) 24.0-24.9, adult: Secondary | ICD-10-CM

## 2022-11-09 DIAGNOSIS — Z1329 Encounter for screening for other suspected endocrine disorder: Secondary | ICD-10-CM

## 2022-11-09 DIAGNOSIS — Z79899 Other long term (current) drug therapy: Secondary | ICD-10-CM

## 2022-11-09 DIAGNOSIS — Z136 Encounter for screening for cardiovascular disorders: Secondary | ICD-10-CM

## 2022-11-09 DIAGNOSIS — E119 Type 2 diabetes mellitus without complications: Secondary | ICD-10-CM

## 2022-11-09 DIAGNOSIS — E782 Mixed hyperlipidemia: Secondary | ICD-10-CM

## 2022-11-09 DIAGNOSIS — M25552 Pain in left hip: Secondary | ICD-10-CM

## 2022-11-09 DIAGNOSIS — T7840XS Allergy, unspecified, sequela: Secondary | ICD-10-CM

## 2022-11-09 DIAGNOSIS — G4700 Insomnia, unspecified: Secondary | ICD-10-CM

## 2022-11-09 DIAGNOSIS — F3340 Major depressive disorder, recurrent, in remission, unspecified: Secondary | ICD-10-CM

## 2022-11-23 NOTE — Progress Notes (Unsigned)
Complete Physical  Assessment and Plan:  Carla Fuller was seen today for annual exam.  Diagnoses and all orders for this visit:  Encounter for Annual Physical Exam with abnormal findings Due annually  Health Maintenance reviewed Healthy lifestyle reviewed and goals set Follows with GYN Dr. Rana Snare Mammograms at breast center Check with insurance about shingrix  Hyperlipidemia Currently on Zetia 10 mg daily and working on diet and exercise Continue low cholesterol diet and exercise.  Check lipid panel.  -     Lipid panel  Attention deficit disorder (ADD) without hyperactivity Currently controlled without medication Helps with focus, no AE's. The patient was counseled on the addictive nature of the medication and was encouraged to take drug holidays when not needed.   Chronic low back pain, unspecified back pain laterality, with sciatica presence unspecified       -     Chronic tailbone pain- numbness in buttocks bilaterally and burning pain in buttocks  Migraine without aura and without status migrainosus, not intractable Uses Excedrin migraine and Nurtec as needed -     Magnesium  Moderate episode of recurrent major depressive disorder in remission Carla Fuller) She reports off of medications and doing much better; remission; monitor; failed numerous in the past, most recently on pristiq which worked well  Lifestyle discussed: diet/exerise, sleep hygiene, stress management, hydration  Insomnia, unspecified type       -      Moderately well managed by current medications, ambien via Dr. Jennette Kettle and melatonin  Overweight- BMI 25 Long discussion about weight loss, diet, and exercise Recommended diet heavy in fruits and veggies and low in animal meats, cheeses, and dairy products, appropriate calorie intake Patient will work on exercise, particularly weight lifting and resistance exercise Excellent progress with mounjaro - sent in 15 mg/week dose High fiber and protein diet, avoid processed  carbs, add regular resistance exercises  Follow up at next visit  Vitamin D deficiency Continue Vit D supplementation to maintain value in therapeutic level of 60-100  -     VITAMIN D 25 Hydroxy (Vit-D Deficiency, Fractures)  Allergic state, sequela      -      Well managed by current medications  Nocturnal leg cramps ABI was normal, CBC, iron has been normal  Doing well with magnesium/heat; increase walking  Medication management -     CBC with Differential/Platelet -     CMP/GFR  Screening for ischemic heart disease -     EKG 12-Lead - defer  Screening for hematuria or proteinuria - Routine urine with reflex microscopic - Microalbumin/creatinine urine ratio  Screening for thyroid disorder -     TSH  Screening for AAA - U/S ABD Retroperitoneal LTD  Type 2 Diabetes Mellitus without complications Continue medications:Mounjaro 12.5 mg Continue diet and exercise.  Perform daily foot/skin check, notify office of any concerning changes.  Check A1C   Chronic kidney disease (CKD), stage II (mild) Increase fluids, avoid NSAIDS, monitor sugars, will monitor  -     COMPLETE METABOLIC PANEL WITH GFR  Chronic constipation Controlled with "calm" magnesium Encouraged high fiber diet, hydration, regular exercise   Cyst on coccyx/coccyx pain/Bursitis of left hip Has seen orthopedics but wants second opinion from neurosurgery Burning pain in buttocks with pins and needle sensation - Referral to neurosurgery   Discussed med's effects and SE's. Screening labs and tests as requested with regular follow-up as recommended. Over 40 minutes of exam, counseling, chart review, and complex, high level critical decision making was  performed this visit.   Future Appointments  Date Time Provider Department Center  11/24/2023  9:00 AM Raynelle DickWilkinson, Deionna Marcantonio E, NP [facility] None    HPI  57 y.o. married Caucasian female  presents for a complete physical and follow up for has Insomnia; Vitamin D  deficiency; ADD (attention deficit disorder); Migraines; Allergy; Chronic lumbar pain; Nocturnal leg cramps; Recurrent major depression in remission; BMI 24.0-24.9, adult; Osteopenia; Mixed hyperlipidemia; Chronic constipation; B12 deficiency; and Hyperinsulinemia on their problem list..   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 Enjoys spending time with grandkids.   Follows with GYN - Dr. Rana SnareLowe - for PAPs. He is prescribing estradiol and progesterone. She is on bASA. Gets mammograms at breast center. Colonoscopy is UTD  She is having numbness/pins and needles/burning pain in buttocks. Was seen by [facility] and he said there was a cyst on coccyx.  She does want a second opinion at neurosurgery. She also has lumbar scoliosis  Hx of depression in remission, did well on pristiq at one point. She has used meds in the past but feels symptoms are controlled without medications She has dx of moderate depression, has failed many agents, lexapro, zoloft, wellbutrin, did well with pristiq for 18 months but stopped and reprots is actually doing fairly with just her night time sleeping medication (ambien 5 mg via Dr. Rana SnareLowe).   She reports she has been followed by ortho for cervical facet issues that seem to be contributing to "migraines" - tension headaches, triggered by stress, sinus. Takes tylenol, sinus med PRN, occasional nurtec samples. Migraines are currently occurring 2 times a month.  Nurtec does relieve symptoms  She has chronic intermittent lumbar pain attributed to sedentary job, ortho follows. Also restless legs, "calm" magnesium gummies have helped. Heating blanket at night. Symptoms are controlled with magnesium  BMI is Body mass index is 25.35 kg/m., she has been working on diet; she is active with her grandkids. Weight has been an ongoing struggle. She has failed phentermine/topamax. She has been on mounjaro with coupon, now on 12.5  mg, tolerates well  Wt Readings from Last 3 Encounters:  11/24/22 150 lb (68 kg)  11/08/22 148 lb 12.8 oz (67.5 kg)  03/31/22 144 lb 6.4 oz (65.5 kg)   Today their BP is BP: 98/62  BP Readings from Last 3 Encounters:  11/24/22 98/62  11/08/22 98/64  03/31/22 100/62  She does workout. She denies chest pain, shortness of breath, dizziness.   She is not on cholesterol medication. Her cholesterol is not at goal. The cholesterol last visit was:   Lab Results  Component Value Date   CHOL 174 03/31/2022   HDL 47 (L) 03/31/2022   LDLCALC 111 (H) 03/31/2022   TRIG 71 03/31/2022   CHOLHDL 3.7 03/31/2022    Last A1C in the office was:  Lab Results  Component Value Date   HGBA1C 4.9 08/27/2020   Last GFR: Lab Results  Component Value Date   EGFR 76 02/24/2022    Patient is on Vitamin D supplement and at goal, taking reduced dose vit D 2000 IU with K2 combo.  Lab Results  Component Value Date   VD25OH 10357 09/08/2021     She reports is not currently on a supplement -  Lab Results  Component Value Date   VITAMINB12 444 09/08/2021      Current Medications:  Current Outpatient Medications on File Prior to Visit  Medication Sig Dispense  Refill   B Complex-Biotin-FA (B-COMPLEX PO) Take by mouth.     Cholecalciferol (VITAMIN D PO) Take 10,000 Units by mouth.      estradiol (ESTRACE) 2 MG tablet Take 2 mg by mouth daily.     ezetimibe (ZETIA) 10 MG tablet Take 1 tablet (10 mg total) by mouth daily. 30 tablet 11   gabapentin (NEURONTIN) 100 MG capsule Take 100 mg by mouth 3 (three) times daily.     lidocaine (LIDODERM) 5 % Apply 1 patch every 12 hours. Remove & Discard patch within 12 hours or as directed by MD 6 patch 0   MAGNESIUM PO Take by mouth. Takess 4 gummies daily     meloxicam (MOBIC) 15 MG tablet Take 15 mg by mouth daily as needed.     Menaquinone-7 (VITAMIN K2 PO) Take by mouth.     methocarbamol (ROBAXIN) 500 MG tablet TAKE 1 TABLET(500 MG) BY MOUTH EVERY 6 HOURS AS  NEEDED FOR MUSCLE SPASMS 60 tablet 1   omeprazole (PRILOSEC) 20 MG capsule Take 20 mg by mouth daily.     ondansetron (ZOFRAN) 4 MG tablet Take 1 tablet (4 mg total) by mouth daily as needed for nausea or vomiting. 30 tablet 1   progesterone (PROMETRIUM) 100 MG capsule Take 100 mg by mouth at bedtime.     tirzepatide (MOUNJARO) 10 MG/0.5ML Pen Inject 10 mg into the skin once a week. 2 mL 3   triamcinolone cream (KENALOG) 0.1 % APPLY SMALL AMOUNT TO THE AFFECTED AREA TWICE DAILY WHEN FLARING FOR 1 TO 2 WEEKS     zolpidem (AMBIEN) 10 MG tablet Take 10 mg by mouth at bedtime as needed.     Insulin Syringe-Needle U-100 31G X 5/16" 0.3 ML MISC Use 1 daily 100 each 0   No current facility-administered medications on file prior to visit.   Allergies:  Allergies  Allergen Reactions   Percocet [Oxycodone-Acetaminophen]    Medical History:  She has Insomnia; Vitamin D deficiency; ADD (attention deficit disorder); Migraines; Allergy; Chronic lumbar pain; Nocturnal leg cramps; Recurrent major depression in remission; BMI 24.0-24.9, adult; Osteopenia; Mixed hyperlipidemia; Chronic constipation; B12 deficiency; and Hyperinsulinemia on their problem list.  Health Maintenance:   Immunization History  Administered Date(s) Administered   Influenza Inj Mdck Quad With Preservative 05/16/2018, 05/22/2019, 07/07/2020   Influenza Split 05/09/2014   Influenza,inj,Quad PF,6+ Mos 05/31/2021   Influenza-Unspecified 05/02/2013   PFIZER(Purple Top)SARS-COV-2 Vaccination 10/02/2019, 10/25/2019   Pneumococcal-Unspecified 08/15/1997   Td 08/15/2006   Tdap 09/15/2012   Health Maintenance  Topic Date Due   Zoster Vaccines- Shingrix (1 of 2) Never done   PAP SMEAR-Modifier  03/02/2018   COVID-19 Vaccine (3 - Pfizer risk series) 11/22/2019   Diabetic kidney evaluation - Urine ACR  09/08/2022   DTaP/Tdap/Td (3 - Td or Tdap) 09/15/2022   Diabetic kidney evaluation - eGFR measurement  02/25/2023   INFLUENZA VACCINE   03/16/2023   COLONOSCOPY (Pts 45-74yrs Insurance coverage will need to be confirmed)  03/23/2023   DEXA SCAN  05/08/2023   MAMMOGRAM  05/21/2023   HPV VACCINES  Aged Out   Hepatitis C Screening  Discontinued   HIV Screening  Discontinued    LMP: Patient has had an ablation. Pap: ? 02/2020 norm, Dr. Satira Sark office annually  MGM: getting at breast center DEXA: getting q2y, osteopenia, R fem t-1.6  Last Dental Exam: 2022, goes q34m, Dr. Regan Rakers  Last Eye Exam: 2022 Dr. Deirdre Evener office, usually sees Dr. Emily Filbert Skin exam: 2019,  Dr. Nicholas Lose, no concerns  Patient Care Team: Lucky Cowboy, MD as PCP - General (Internal Medicine) Judd Gaudier, NP as Nurse Practitioner (Nurse Practitioner) Javier Glazier (Physician Assistant)  Surgical History:  She has a past surgical history that includes Gynecologic cryosurgery; Novasure ablation; Oophorectomy (Right); Breast enhancement surgery (Bilateral, 1991); Parotidectomy (Left, 09/2008); Breast biopsy (Left, 2018); Tooth extraction (Right, 05/24/2018); Augmentation mammaplasty (Bilateral); Shoulder adhesion release (Right, 01/14/2016); Shoulder adhesion release (Left, 2012); and Shoulder arthroscopy (Right, 05/30/2020). Family History:  Herfamily history includes Breast cancer in her maternal aunt, maternal grandmother, and paternal aunt; Cancer in her father; Depression in her mother; Diabetes in her brother; Heart disease in her father, maternal grandfather, and paternal grandfather; Hypertension in her father; Stroke in her paternal grandfather; Suicidality in her paternal grandmother; Tongue cancer in her father. Social History:  She reports that she has never smoked. She has never used smokeless tobacco. She reports current alcohol use. She reports that she does not use drugs.   Review of Systems: Review of Systems  Constitutional:  Negative for chills, diaphoresis, fever, malaise/fatigue and weight loss.  HENT:  Negative for ear  pain, hearing loss and tinnitus.   Eyes:  Positive for redness (on drops from Dr Elmer Picker). Negative for blurred vision, double vision, photophobia and pain.  Respiratory:  Negative for cough, sputum production, shortness of breath and wheezing.   Cardiovascular:  Negative for chest pain, palpitations, orthopnea, claudication and leg swelling.  Gastrointestinal:  Positive for constipation (improved with magnesium). Negative for abdominal pain, blood in stool, diarrhea, heartburn, melena, nausea and vomiting.  Genitourinary: Negative.  Negative for dysuria and urgency.  Musculoskeletal:  Positive for back pain (low back/sacral- burning with pins/needles). Negative for joint pain, myalgias and neck pain.  Skin:  Negative for rash.  Neurological:  Positive for tingling (buttocks). Negative for dizziness, sensory change, weakness and headaches.  Endo/Heme/Allergies:  Positive for environmental allergies. Negative for polydipsia.  Psychiatric/Behavioral:  Negative for depression, memory loss, substance abuse and suicidal ideas. The patient has insomnia (managed with med from GYN). The patient is not nervous/anxious.   All other systems reviewed and are negative.   Physical Exam: Estimated body mass index is 25.35 kg/m as calculated from the following:   Height as of this encounter: 5' 4.5" (1.638 m).   Weight as of this encounter: 150 lb (68 kg). BP 98/62   Pulse 75   Temp (!) 97.5 F (36.4 C)   Ht 5' 4.5" (1.638 m)   Wt 150 lb (68 kg)   SpO2 99%   BMI 25.35 kg/m  General Appearance: Well nourished, in no apparent distress.  Eyes: PERRLA, EOMs, erythema of conjunctiva bilaterally Sinuses: No Frontal/maxillary tenderness  ENT/Mouth: Ext aud canals clear, normal light reflex with TMs without erythema, bulging. Good dentition. No erythema, swelling, or exudate on post pharynx. Tonsils not swollen or erythematous. Hearing normal.  Neck: Supple, thyroid normal. No bruits  Respiratory:  Respiratory effort normal, BS equal bilaterally without rales, rhonchi, wheezing or stridor.  Cardio: RRR without murmurs, rubs or gallops. Distal pulses symmetrical and intact.  Chest: symmetric, with normal excursions and percussion.  Breasts: Defer to GYN, mmg annually Abdomen: Soft, BS x 4, no tenderness, no guarding, rebound, hernias, masses, or organomegaly.  Lymphatics: Non tender without lymphadenopathy.  Genitourinary: Defer to GYN Musculoskeletal: Full ROM all peripheral extremities,5/5 strength, and normal gait.  Skin: Warm, dry without rashes, ecchymosis, lesions.  Neuro: Cranial nerves intact, reflexes diminished throughout though equal bilaterally. Normal  muscle tone, no cerebellar symptoms. Sensation intact.  Psych: Awake and oriented X 3, normal affect, Insight and Judgment appropriate.   EKG: NSR, no ST changes AAA: < 3 cm  Carla Bozard Hollie Salk, NP 9:13 AM [facility]

## 2022-11-24 ENCOUNTER — Ambulatory Visit (INDEPENDENT_AMBULATORY_CARE_PROVIDER_SITE_OTHER): Payer: 59 | Admitting: Nurse Practitioner

## 2022-11-24 ENCOUNTER — Encounter: Payer: Self-pay | Admitting: Nurse Practitioner

## 2022-11-24 VITALS — BP 98/62 | HR 75 | Temp 97.5°F | Ht 64.5 in | Wt 150.0 lb

## 2022-11-24 DIAGNOSIS — Z136 Encounter for screening for cardiovascular disorders: Secondary | ICD-10-CM

## 2022-11-24 DIAGNOSIS — G4762 Sleep related leg cramps: Secondary | ICD-10-CM

## 2022-11-24 DIAGNOSIS — Z0000 Encounter for general adult medical examination without abnormal findings: Secondary | ICD-10-CM

## 2022-11-24 DIAGNOSIS — F3340 Major depressive disorder, recurrent, in remission, unspecified: Secondary | ICD-10-CM

## 2022-11-24 DIAGNOSIS — E559 Vitamin D deficiency, unspecified: Secondary | ICD-10-CM

## 2022-11-24 DIAGNOSIS — I10 Essential (primary) hypertension: Secondary | ICD-10-CM

## 2022-11-24 DIAGNOSIS — E119 Type 2 diabetes mellitus without complications: Secondary | ICD-10-CM

## 2022-11-24 DIAGNOSIS — F988 Other specified behavioral and emotional disorders with onset usually occurring in childhood and adolescence: Secondary | ICD-10-CM

## 2022-11-24 DIAGNOSIS — M533 Sacrococcygeal disorders, not elsewhere classified: Secondary | ICD-10-CM

## 2022-11-24 DIAGNOSIS — Z0001 Encounter for general adult medical examination with abnormal findings: Secondary | ICD-10-CM

## 2022-11-24 DIAGNOSIS — Z1329 Encounter for screening for other suspected endocrine disorder: Secondary | ICD-10-CM

## 2022-11-24 DIAGNOSIS — T7840XS Allergy, unspecified, sequela: Secondary | ICD-10-CM

## 2022-11-24 DIAGNOSIS — M7072 Other bursitis of hip, left hip: Secondary | ICD-10-CM

## 2022-11-24 DIAGNOSIS — I700 Atherosclerosis of aorta: Secondary | ICD-10-CM

## 2022-11-24 DIAGNOSIS — Z79899 Other long term (current) drug therapy: Secondary | ICD-10-CM

## 2022-11-24 DIAGNOSIS — Z1389 Encounter for screening for other disorder: Secondary | ICD-10-CM

## 2022-11-24 DIAGNOSIS — E782 Mixed hyperlipidemia: Secondary | ICD-10-CM

## 2022-11-24 DIAGNOSIS — G4700 Insomnia, unspecified: Secondary | ICD-10-CM

## 2022-11-24 DIAGNOSIS — G8929 Other chronic pain: Secondary | ICD-10-CM

## 2022-11-24 DIAGNOSIS — E663 Overweight: Secondary | ICD-10-CM

## 2022-11-24 DIAGNOSIS — G43009 Migraine without aura, not intractable, without status migrainosus: Secondary | ICD-10-CM

## 2022-11-24 MED ORDER — LIDOCAINE 5 % EX PTCH
MEDICATED_PATCH | CUTANEOUS | 0 refills | Status: DC
Start: 2022-11-24 — End: 2023-07-31

## 2022-11-24 NOTE — Patient Instructions (Signed)

## 2022-11-25 LAB — URINALYSIS, ROUTINE W REFLEX MICROSCOPIC
Bilirubin Urine: NEGATIVE
Glucose, UA: NEGATIVE
Hgb urine dipstick: NEGATIVE
Ketones, ur: NEGATIVE
Leukocytes,Ua: NEGATIVE
Nitrite: NEGATIVE
Protein, ur: NEGATIVE
Specific Gravity, Urine: 1.003 (ref 1.001–1.035)
pH: 6.5 (ref 5.0–8.0)

## 2022-11-25 LAB — COMPLETE METABOLIC PANEL WITH GFR
AG Ratio: 2 (calc) (ref 1.0–2.5)
ALT: 9 U/L (ref 6–29)
AST: 12 U/L (ref 10–35)
Albumin: 4.3 g/dL (ref 3.6–5.1)
Alkaline phosphatase (APISO): 52 U/L (ref 37–153)
BUN: 18 mg/dL (ref 7–25)
CO2: 27 mmol/L (ref 20–32)
Calcium: 9 mg/dL (ref 8.6–10.4)
Chloride: 106 mmol/L (ref 98–110)
Creat: 0.88 mg/dL (ref 0.50–1.03)
Globulin: 2.2 g/dL (calc) (ref 1.9–3.7)
Glucose, Bld: 73 mg/dL (ref 65–99)
Potassium: 4.7 mmol/L (ref 3.5–5.3)
Sodium: 140 mmol/L (ref 135–146)
Total Bilirubin: 0.5 mg/dL (ref 0.2–1.2)
Total Protein: 6.5 g/dL (ref 6.1–8.1)
eGFR: 77 mL/min/{1.73_m2} (ref >=60)

## 2022-11-25 LAB — LIPID PANEL
Cholesterol: 163 mg/dL (ref <200)
HDL: 62 mg/dL (ref >=50)
LDL Cholesterol (Calc): 84 mg/dL (calc)
Non-HDL Cholesterol (Calc): 101 mg/dL (calc) (ref <130)
Total CHOL/HDL Ratio: 2.6 (calc) (ref <5.0)
Triglycerides: 79 mg/dL (ref <150)

## 2022-11-25 LAB — TSH: TSH: 2.3 mIU/L (ref 0.40–4.50)

## 2022-11-25 LAB — CBC WITH DIFFERENTIAL/PLATELET
Absolute Monocytes: 342 cells/uL (ref 200–950)
Basophils Absolute: 51 cells/uL (ref 0–200)
Basophils Relative: 0.9 %
Eosinophils Absolute: 68 cells/uL (ref 15–500)
Eosinophils Relative: 1.2 %
HCT: 38.7 % (ref 35.0–45.0)
Hemoglobin: 13 g/dL (ref 11.7–15.5)
Lymphs Abs: 2388 cells/uL (ref 850–3900)
MCH: 30 pg (ref 27.0–33.0)
MCHC: 33.6 g/dL (ref 32.0–36.0)
MCV: 89.4 fL (ref 80.0–100.0)
MPV: 8.9 fL (ref 7.5–12.5)
Monocytes Relative: 6 %
Neutro Abs: 2850 cells/uL (ref 1500–7800)
Neutrophils Relative %: 50 %
Platelets: 338 10*3/uL (ref 140–400)
RBC: 4.33 10*6/uL (ref 3.80–5.10)
RDW: 11.9 % (ref 11.0–15.0)
Total Lymphocyte: 41.9 %
WBC: 5.7 10*3/uL (ref 3.8–10.8)

## 2022-11-25 LAB — HEMOGLOBIN A1C
Hgb A1c MFr Bld: 4.9 % of total Hgb (ref <5.7)
Mean Plasma Glucose: 94 mg/dL
eAG (mmol/L): 5.2 mmol/L

## 2022-11-25 LAB — MICROALBUMIN / CREATININE URINE RATIO
Creatinine, Urine: 15 mg/dL — ABNORMAL LOW (ref 20–275)
Microalb, Ur: <0.2 mg/dL

## 2022-11-25 LAB — VITAMIN D 25 HYDROXY (VIT D DEFICIENCY, FRACTURES): Vit D, 25-Hydroxy: 61 ng/mL (ref 30–100)

## 2022-11-25 LAB — MAGNESIUM: Magnesium: 2.2 mg/dL (ref 1.5–2.5)

## 2022-12-07 ENCOUNTER — Emergency Department (HOSPITAL_BASED_OUTPATIENT_CLINIC_OR_DEPARTMENT_OTHER): Payer: 59 | Admitting: Radiology

## 2022-12-07 ENCOUNTER — Emergency Department (HOSPITAL_BASED_OUTPATIENT_CLINIC_OR_DEPARTMENT_OTHER)
Admission: EM | Admit: 2022-12-07 | Discharge: 2022-12-07 | Disposition: A | Discharge status: Home / Self Care | Payer: 59 | Attending: Emergency Medicine | Admitting: Emergency Medicine

## 2022-12-07 ENCOUNTER — Other Ambulatory Visit: Payer: Self-pay

## 2022-12-07 ENCOUNTER — Encounter (HOSPITAL_BASED_OUTPATIENT_CLINIC_OR_DEPARTMENT_OTHER): Payer: Self-pay

## 2022-12-07 DIAGNOSIS — S43004A Unspecified dislocation of right shoulder joint, initial encounter: Secondary | ICD-10-CM

## 2022-12-07 DIAGNOSIS — M25511 Pain in right shoulder: Secondary | ICD-10-CM | POA: Diagnosis present

## 2022-12-07 DIAGNOSIS — S43014A Anterior dislocation of right humerus, initial encounter: Secondary | ICD-10-CM | POA: Diagnosis not present

## 2022-12-07 DIAGNOSIS — X58XXXA Exposure to other specified factors, initial encounter: Secondary | ICD-10-CM | POA: Insufficient documentation

## 2022-12-07 MED ORDER — LIDOCAINE-EPINEPHRINE (PF) 2 %-1:200000 IJ SOLN
10.0000 mL | Freq: Once | INTRAMUSCULAR | Status: AC
  Administered 2022-12-07: 10 mL via INTRADERMAL
  Filled 2022-12-07: qty 20

## 2022-12-07 MED ORDER — FENTANYL CITRATE PF 50 MCG/ML IJ SOSY
100.0000 ug | PREFILLED_SYRINGE | Freq: Once | INTRAMUSCULAR | Status: AC
  Administered 2022-12-07: 100 ug via INTRAVENOUS
  Filled 2022-12-07: qty 2

## 2022-12-07 NOTE — ED Triage Notes (Signed)
Pt BIB EMS for right shoulder dislocation. Per pt, she has hx of shoulder dislocation. Per pt, she was sleeping and woke up with her shoulder dislocated.

## 2022-12-07 NOTE — ED Provider Notes (Signed)
[place] EMERGENCY DEPARTMENT AT [facility] Provider Note   CSN: [id number] Arrival date & time: 12/07/22  6295     History  Chief Complaint  Patient presents with   Shoulder Pain    Carla Fuller is a 57 y.o. female.  57 yo F with a cc of right shoulder pain.  Patient with hx of prior shoulder dislocation.  She felt that suddenly fall out of socket tonight while she was sleeping on her stomach.  This occurred about 3 hours ago.  She had a fall a few days ago but does not think she had any injury to her shoulder.  She is wondering if they are somehow related.  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   Shoulder Pain      Home Medications Prior to Admission medications   Medication Sig Start Date End Date Taking? Authorizing Provider  B Complex-Biotin-FA (B-COMPLEX PO) Take by mouth.    [provider]  Cholecalciferol (VITAMIN D PO) Take 10,000 Units by mouth.     [provider]  estradiol (ESTRACE) 2 MG tablet Take 2 mg by mouth daily.    [provider]  ezetimibe (ZETIA) 10 MG tablet Take 1 tablet (10 mg total) by mouth daily. 03/04/22 03/04/23  Raynelle Dick, NP  gabapentin (NEURONTIN) 100 MG capsule Take 100 mg by mouth 3 (three) times daily.    [provider]  Insulin Syringe-Needle U-100 31G X 5/16" 0.3 ML MISC Use 1 daily 11/02/22   Raynelle Dick, NP  lidocaine (LIDODERM) 5 % Apply 1 patch every 12 hours. Remove & Discard patch within 12 hours or as directed by MD 11/24/22   Raynelle Dick, NP  MAGNESIUM PO Take by mouth. Takess 4 gummies daily    [provider]  meloxicam (MOBIC) 15 MG tablet Take 15 mg by mouth daily as needed. 11/15/22   [provider]  Menaquinone-7 (VITAMIN K2 PO) Take by mouth.    [provider]  methocarbamol (ROBAXIN) 500 MG tablet TAKE 1 TABLET(500 MG) BY MOUTH EVERY 6 HOURS AS NEEDED FOR MUSCLE SPASMS 09/05/22   Raynelle Dick,  NP  omeprazole (PRILOSEC) 20 MG capsule Take 20 mg by mouth daily.    [provider]  ondansetron (ZOFRAN) 4 MG tablet Take 1 tablet (4 mg total) by mouth daily as needed for nausea or vomiting. 06/24/22 06/24/23  Raynelle Dick, NP  progesterone (PROMETRIUM) 100 MG capsule Take 100 mg by mouth at bedtime.    [provider]  tirzepatide Greggory Keen) 10 MG/0.5ML Pen Inject 10 mg into the skin once a week. 09/19/22   Raynelle Dick, NP  triamcinolone cream (KENALOG) 0.1 % APPLY SMALL AMOUNT TO THE AFFECTED AREA TWICE DAILY WHEN FLARING FOR 1 TO 2 WEEKS    [provider]  zolpidem (AMBIEN) 10 MG tablet Take 10 mg by mouth at bedtime as needed.    [provider]      Allergies    Percocet [oxycodone-acetaminophen]    Review of Systems   Review of Systems  Physical Exam Updated Vital Signs BP (!) 106/58 (BP Location: Right Arm)   Pulse 97   Temp 98.1 F (36.7 C) (Oral)   Resp 19   Ht  (1.626 m)   Wt 68 kg   SpO2 100%   BMI 25.75 kg/m  Physical Exam Vitals and nursing note reviewed.  Constitutional:      General: She is  not in acute distress.    Appearance: She is well-developed. She is not diaphoretic.  HENT:     Head: Normocephalic and atraumatic.  Eyes:     Pupils: Pupils are equal, round, and reactive to light.  Cardiovascular:     Rate and Rhythm: Normal rate and regular rhythm.     Heart sounds: No murmur heard.    No friction rub. No gallop.  Pulmonary:     Effort: Pulmonary effort is normal.     Breath sounds: No wheezing or rales.  Abdominal:     General: There is no distension.     Palpations: Abdomen is soft.     Tenderness: There is no abdominal tenderness.  Musculoskeletal:        General: Tenderness and deformity present.     Cervical back: Normal range of motion and neck supple.     Comments: Obvious deformity to the right shoulder.  Intact pulse motor and sensation distally.  Skin:    General: Skin is warm and  dry.  Neurological:     Mental Status: She is alert and oriented to person, place, and time.  Psychiatric:        Behavior: Behavior normal.     ED Results / Procedures / Treatments   Labs (all labs ordered are listed, but only abnormal results are displayed) Labs Reviewed - No data to display  EKG None  Radiology No results found.  Procedures Reduction of dislocation  Date/Time: 12/07/2022 6:26 AM  Performed by: Melene Plan, DO Authorized by: Melene Plan, DO  Consent: Verbal consent obtained. Risks and benefits: risks, benefits and alternatives were discussed Consent given by: patient and spouse Imaging studies: imaging studies available Patient identity confirmed: verbally with patient Time out: Immediately prior to procedure a "time out" was called to verify the correct patient, procedure, equipment, support staff and site/side marked as required. Local anesthesia used: yes Anesthesia: local infiltration  Anesthesia: Local anesthesia used: yes Local Anesthetic: lidocaine 2% with epinephrine Anesthetic total: 10 mL  Sedation: Patient sedated: no  Patient tolerance: patient tolerated the procedure well with no immediate complications       Medications Ordered in ED Medications  fentaNYL (SUBLIMAZE) injection 100 mcg (100 mcg Intravenous Given 12/07/22 0600)  lidocaine-EPINEPHrine (XYLOCAINE W/EPI) 2 %-1:200000 (PF) injection 10 mL (10 mLs Intradermal Given 12/07/22 0602)    ED Course/ Medical Decision Making/ A&P                             Medical Decision Making Amount and/or Complexity of Data Reviewed Radiology: ordered.  Risk Prescription drug management.   57 yo F with a cc of right shoulder pain.  Patient has a history of right shoulder dislocations.  She thinks that this is the same.  Clinically is dislocated on initial exam.  Plain film independently interpreted by me without obvious fracture but obvious anterior dislocation.  Will attempt to  reduce at bedside.  Reduced at bedside.  Orthopedic follow-up.  6:26 AM:  I have discussed the diagnosis/risks/treatment options with the patient.  Evaluation and diagnostic testing in the emergency department does not suggest an emergent condition requiring admission or immediate intervention beyond what has been performed at this time.  They will follow up with PCP. We also discussed returning to the ED immediately if new or worsening sx occur. We discussed the sx which are most concerning (e.g., sudden worsening pain, fever, inability to tolerate by  mouth) that necessitate immediate return. Medications administered to the patient during their visit and any new prescriptions provided to the patient are listed below.  Medications given during this visit Medications  fentaNYL (SUBLIMAZE) injection 100 mcg (100 mcg Intravenous Given 12/07/22 0600)  lidocaine-EPINEPHrine (XYLOCAINE W/EPI) 2 %-1:200000 (PF) injection 10 mL (10 mLs Intradermal Given 12/07/22 0602)     The patient appears reasonably screen and/or stabilized for discharge and I doubt any other medical condition or other [facility] requiring further screening, evaluation, or treatment in the ED at this time prior to discharge.          Final Clinical Impression(s) / ED Diagnoses Final diagnoses:  Dislocation of right shoulder joint, initial encounter    Rx / DC Orders ED Discharge Orders     None         Melene Plan, DO 12/07/22 [phone]

## 2022-12-07 NOTE — Discharge Instructions (Signed)
Take 4 over the counter ibuprofen tablets 3 times a day or 2 over-the-counter naproxen tablets twice a day for pain. Also take tylenol (2 extra strength) four times a day.   Follow up with your ortho in the office.

## 2022-12-07 NOTE — ED Notes (Signed)
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 Has required surgery in the past d/t this.

## 2022-12-08 ENCOUNTER — Other Ambulatory Visit (HOSPITAL_COMMUNITY): Payer: Self-pay | Admitting: Orthopedic Surgery

## 2022-12-08 DIAGNOSIS — M25511 Pain in right shoulder: Secondary | ICD-10-CM

## 2022-12-09 ENCOUNTER — Ambulatory Visit (HOSPITAL_COMMUNITY)
Admission: RE | Admit: 2022-12-09 | Discharge: 2022-12-09 | Disposition: A | Discharge status: Home / Self Care | Payer: 59 | Source: Ambulatory Visit | Attending: Orthopedic Surgery | Admitting: Orthopedic Surgery

## 2022-12-09 DIAGNOSIS — M67813 Other specified disorders of tendon, right shoulder: Secondary | ICD-10-CM | POA: Insufficient documentation

## 2022-12-09 DIAGNOSIS — M25511 Pain in right shoulder: Secondary | ICD-10-CM | POA: Diagnosis present

## 2022-12-09 DIAGNOSIS — M25311 Other instability, right shoulder: Secondary | ICD-10-CM | POA: Insufficient documentation

## 2022-12-09 DIAGNOSIS — W19XXXA Unspecified fall, initial encounter: Secondary | ICD-10-CM | POA: Diagnosis not present

## 2023-02-14 ENCOUNTER — Other Ambulatory Visit: Payer: Self-pay | Admitting: Nurse Practitioner

## 2023-02-14 DIAGNOSIS — R110 Nausea: Secondary | ICD-10-CM

## 2023-02-27 ENCOUNTER — Other Ambulatory Visit: Payer: Self-pay | Admitting: Nurse Practitioner

## 2023-02-27 DIAGNOSIS — B3731 Acute candidiasis of vulva and vagina: Secondary | ICD-10-CM

## 2023-02-27 DIAGNOSIS — E782 Mixed hyperlipidemia: Secondary | ICD-10-CM

## 2023-02-27 MED ORDER — FLUCONAZOLE 150 MG PO TABS
ORAL_TABLET | ORAL | 3 refills | Status: DC
Start: 2023-02-27 — End: 2023-08-07

## 2023-03-09 ENCOUNTER — Other Ambulatory Visit: Payer: Self-pay | Admitting: Internal Medicine

## 2023-03-09 MED ORDER — TIRZEPATIDE 12.5 MG/0.5ML ~~LOC~~ SOAJ: SUBCUTANEOUS | 0 refills | Status: DC

## 2023-03-19 ENCOUNTER — Other Ambulatory Visit: Payer: Self-pay | Admitting: Nurse Practitioner

## 2023-04-19 ENCOUNTER — Encounter: Payer: Self-pay | Admitting: Nurse Practitioner

## 2023-04-19 ENCOUNTER — Other Ambulatory Visit: Payer: Self-pay | Admitting: Nurse Practitioner

## 2023-04-19 DIAGNOSIS — M8580 Other specified disorders of bone density and structure, unspecified site: Secondary | ICD-10-CM

## 2023-04-19 DIAGNOSIS — E2839 Other primary ovarian failure: Secondary | ICD-10-CM

## 2023-04-20 ENCOUNTER — Other Ambulatory Visit: Payer: Self-pay | Admitting: Nurse Practitioner

## 2023-04-20 DIAGNOSIS — Z1231 Encounter for screening mammogram for malignant neoplasm of breast: Secondary | ICD-10-CM

## 2023-04-20 DIAGNOSIS — M8580 Other specified disorders of bone density and structure, unspecified site: Secondary | ICD-10-CM

## 2023-04-20 DIAGNOSIS — E2839 Other primary ovarian failure: Secondary | ICD-10-CM

## 2023-05-23 ENCOUNTER — Ambulatory Visit
Admission: RE | Admit: 2023-05-23 | Discharge: 2023-05-23 | Disposition: A | Discharge status: Home / Self Care | Payer: 59 | Source: Ambulatory Visit | Attending: Nurse Practitioner

## 2023-05-23 DIAGNOSIS — Z1231 Encounter for screening mammogram for malignant neoplasm of breast: Secondary | ICD-10-CM

## 2023-05-25 ENCOUNTER — Other Ambulatory Visit: Payer: Self-pay | Admitting: Nurse Practitioner

## 2023-05-25 DIAGNOSIS — H6692 Otitis media, unspecified, left ear: Secondary | ICD-10-CM

## 2023-05-25 MED ORDER — AMOXICILLIN 500 MG PO TABS
500.0000 mg | ORAL_TABLET | Freq: Three times a day (TID) | ORAL | 0 refills | Status: DC
Start: 2023-05-25 — End: 2023-08-07

## 2023-05-25 NOTE — Progress Notes (Signed)
Left ear pain, congestion, fatigue- R TM- no bulging or erythema; L TM dull bulging mild erythema Amoxicillin 500 mg TID x 10 sent to [facility].  Monitor symptoms and if no improvement notify the office

## 2023-05-30 ENCOUNTER — Ambulatory Visit: Payer: 59 | Admitting: Nurse Practitioner

## 2023-06-27 ENCOUNTER — Other Ambulatory Visit: Payer: Self-pay | Admitting: Internal Medicine

## 2023-06-29 ENCOUNTER — Ambulatory Visit: Payer: 59 | Admitting: Nurse Practitioner

## 2023-06-29 ENCOUNTER — Encounter: Payer: Self-pay | Admitting: Internal Medicine

## 2023-07-25 ENCOUNTER — Ambulatory Visit: Payer: 59 | Admitting: Nurse Practitioner

## 2023-07-25 NOTE — Progress Notes (Signed)
Not seen

## 2023-07-27 ENCOUNTER — Ambulatory Visit: Payer: 59 | Admitting: Nurse Practitioner

## 2023-07-31 ENCOUNTER — Other Ambulatory Visit: Payer: Self-pay | Admitting: Nurse Practitioner

## 2023-07-31 DIAGNOSIS — E782 Mixed hyperlipidemia: Secondary | ICD-10-CM

## 2023-07-31 DIAGNOSIS — M7072 Other bursitis of hip, left hip: Secondary | ICD-10-CM

## 2023-07-31 DIAGNOSIS — R21 Rash and other nonspecific skin eruption: Secondary | ICD-10-CM

## 2023-07-31 MED ORDER — EZETIMIBE 10 MG PO TABS
10.0000 mg | ORAL_TABLET | Freq: Every day | ORAL | 11 refills | Status: DC
Start: 2023-07-31 — End: 2024-04-11

## 2023-07-31 MED ORDER — TRIAMCINOLONE ACETONIDE 0.1 % EX CREA
TOPICAL_CREAM | Freq: Two times a day (BID) | CUTANEOUS | 2 refills | Status: AC
Start: 2023-07-31 — End: ?

## 2023-08-03 ENCOUNTER — Other Ambulatory Visit: Payer: Self-pay | Admitting: Nurse Practitioner

## 2023-08-03 DIAGNOSIS — J069 Acute upper respiratory infection, unspecified: Secondary | ICD-10-CM

## 2023-08-03 MED ORDER — DEXAMETHASONE 4 MG PO TABS
ORAL_TABLET | ORAL | 0 refills | Status: DC
Start: 2023-08-03 — End: 2024-02-21

## 2023-08-07 ENCOUNTER — Ambulatory Visit: Payer: 59 | Admitting: Nurse Practitioner

## 2023-08-07 ENCOUNTER — Encounter: Payer: Self-pay | Admitting: Nurse Practitioner

## 2023-08-07 VITALS — BP 90/56 | HR 76 | Temp 97.3°F | Ht 64.0 in

## 2023-08-07 DIAGNOSIS — U071 COVID-19: Secondary | ICD-10-CM | POA: Diagnosis not present

## 2023-08-07 MED ORDER — BENZONATATE 100 MG PO CAPS
100.0000 mg | ORAL_CAPSULE | Freq: Four times a day (QID) | ORAL | 1 refills | Status: DC | PRN
Start: 2023-08-07 — End: 2024-02-21

## 2023-08-07 MED ORDER — AZITHROMYCIN 250 MG PO TABS
ORAL_TABLET | ORAL | 1 refills | Status: DC
Start: 2023-08-07 — End: 2024-02-21

## 2023-08-07 MED ORDER — PROMETHAZINE-DM 6.25-15 MG/5ML PO SYRP
5.0000 mL | ORAL_SOLUTION | Freq: Four times a day (QID) | ORAL | 1 refills | Status: DC | PRN
Start: 2023-08-07 — End: 2024-02-21

## 2023-08-07 NOTE — Progress Notes (Signed)
THIS ENCOUNTER IS A VIRTUAL VISIT DUE TO COVID-19 - PATIENT WAS NOT SEEN IN THE OFFICE.  PATIENT HAS CONSENTED TO VIRTUAL VISIT / TELEMEDICINE VISIT   Virtual Visit via telephone Note  I connected with  Carla Fuller on 08/07/2023 by telephone.  I verified that I am speaking with the correct person using two identifiers.    I discussed the limitations of evaluation and management by telemedicine and the availability of in person appointments. The patient expressed understanding and agreed to proceed.  History of Present Illness:  BP (!) 90/56   Pulse 76   Temp (!) 97.3 F (36.3 C)   Ht 5\' 4"  (1.626 m)   SpO2 99%   BMI 25.75 kg/m  57 y.o. patient contacted office reporting URI sx productive cough white/yellow. she tested positive by covid test at home. OV was conducted by telephone to minimize exposure. This patient was vaccinated for covid 19, last 10/25/19  Sx began 3 days ago with fatigue, chills, nausea, productive cough of yellow mucus and nasal congestion with yellow mucus.  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 Take with food.   estradiol (ESTRACE) 2 MG tablet, Take 2 mg by mouth daily.   MOUNJARO 12.5 MG/0.5ML Pen, INJECT 1 PEN EVERY SEVEN DAYS   progesterone (PROMETRIUM) 100 MG capsule, Take 100 mg by mouth at bedtime.  Current Outpatient Medications (Cardiovascular):    ezetimibe (ZETIA) 10 MG tablet, Take 1 tablet (10 mg total) by mouth daily.  Current Outpatient Medications (Respiratory):    DM-Phenylephrine-Acetaminophen (ALKA-SELTZER PLUS DAY COLD/FLU PO), Take by mouth.  Current Outpatient Medications (Analgesics):    meloxicam (MOBIC) 15 MG tablet, Take 15 mg by mouth daily as needed. (Patient not taking: Reported on 08/07/2023)   Current Outpatient  Medications (Other):    B Complex-Biotin-FA (B-COMPLEX PO), Take by mouth.   Cholecalciferol (VITAMIN D PO), Take 10,000 Units by mouth.    gabapentin (NEURONTIN) 100 MG capsule, Take 100 mg by mouth 3 (three) times daily.   MAGNESIUM PO, Take by mouth. Takess 4 gummies daily   Menaquinone-7 (VITAMIN K2 PO), Take by mouth.   omeprazole (PRILOSEC) 20 MG capsule, Take 20 mg by mouth daily.   ondansetron (ZOFRAN) 4 MG tablet, TAKE 1 TABLET(4 MG) BY MOUTH DAILY AS NEEDED FOR NAUSEA OR VOMITING   triamcinolone cream (KENALOG) 0.1 %, Apply topically 2 (two) times daily.   zolpidem (AMBIEN) 10 MG tablet, Take 10 mg by mouth at bedtime as needed.   amoxicillin (AMOXIL) 500 MG tablet, Take 1 tablet (500 mg total) by mouth 3 (three) times daily. 10 days (Patient not taking: Reported on 08/07/2023)   fluconazole (DIFLUCAN) 150 MG tablet, Take 1 tab and repeat in 3 days as needed (Patient not taking: Reported on 08/07/2023)   Insulin Syringe-Needle U-100 31G X 5/16" 0.3 ML MISC, Use 1 daily (Patient not taking: Reported on 08/07/2023)   lidocaine (LIDODERM) 5 %, PLACE 1 PATCH EVERY 12 HOURS. REMOVE AND DISCARD PATCH AFTER 12 HOURS (Patient not taking: Reported on 08/07/2023)   methocarbamol (ROBAXIN) 500 MG tablet, TAKE 1 TABLET(500 MG) BY MOUTH EVERY 6 HOURS AS NEEDED FOR MUSCLE SPASMS (Patient not taking: Reported on 08/07/2023)  Allergies:  Allergies  Allergen Reactions   Percocet [Oxycodone-Acetaminophen]  Problem list She has Insomnia; Vitamin D deficiency; ADD (attention deficit disorder); Migraines; Allergy; Chronic lumbar pain; Nocturnal leg cramps; Recurrent major depression in remission (HCC); BMI 24.0-24.9, adult; Osteopenia; Mixed hyperlipidemia; Chronic constipation; B12 deficiency; and Hyperinsulinemia on their problem list.   Social History:   reports that she has never smoked. She has never used smokeless tobacco. She reports current alcohol use. She reports that she does not use  drugs.  Observations/Objective:  General : Well sounding patient in no apparent distress HEENT: no hoarseness, no cough for duration of visit Lungs: speaks in complete sentences, no audible wheezing, no apparent distress Neurological: alert, oriented x 3 Psychiatric: pleasant, judgement appropriate   Assessment and Plan:  Covid 19 Covid 19 positive per rapid screening test at home Risk factors include: has Insomnia; Vitamin D deficiency; ADD (attention deficit disorder); Migraines; Allergy; Chronic lumbar pain; Nocturnal leg cramps; Recurrent major depression in remission (HCC); BMI 24.0-24.9, adult; Osteopenia; Mixed hyperlipidemia; Chronic constipation; B12 deficiency; and Hyperinsulinemia on their problem list.  Symptoms are: mild Immue support reviewed  Take tylenol PRN temp 101+ Push hydration Regular ambulation or calf exercises exercises for clot prevention and 81 mg ASA unless contraindicated Sx supportive therapy suggested Follow up via mychart or telephone if needed Advised patient obtain O2 monitor; present to ED if persistently <90% or with severe dyspnea, CP, fever uncontrolled by tylenol, confusion, sudden decline       Should remain in isolation 5 days from testing positive and then wear a mask when around other people for the following 5 days  [name] was seen today for acute visit.  Diagnoses and all orders for this visit:  COVID -     azithromycin (ZITHROMAX) 250 MG tablet; Take 2 tablets (500 mg) on  Day 1,  followed by 1 tablet (250 mg) once daily on Days 2 through 5. -     benzonatate (TESSALON PERLES) 100 MG capsule; Take 1 capsule (100 mg total) by mouth every 6 (six) hours as needed for cough. -     promethazine-dextromethorphan (PROMETHAZINE-DM) 6.25-15 MG/5ML syrup; Take 5 mLs by mouth 4 (four) times daily as needed for cough.     Follow Up Instructions:  I discussed the assessment and treatment plan with the patient. The patient was provided an  opportunity to ask questions and all were answered. The patient agreed with the plan and demonstrated an understanding of the instructions.   The patient was advised to call back or seek an in-person evaluation if the symptoms worsen or if the condition fails to improve as anticipated.  I provided 15 minutes of non-face-to-face time during this encounter.   Raynelle Dick, NP

## 2023-08-07 NOTE — Patient Instructions (Addendum)
Covid  Immue support reviewed  Take tylenol PRN temp 101+ Push hydration Regular ambulation or calf exercises exercises for clot prevention and 81 mg ASA unless contraindicated Sx supportive therapy suggested Follow up via mychart or telephone if needed Advised patient obtain O2 monitor; present to ED if persistently <90% or with severe dyspnea, CP, fever uncontrolled by tylenol, confusion, sudden decline       Should remain in isolation 5 days from testing positive and then wear a mask when around other people for the following 5 days -     azithromycin (ZITHROMAX) 250 MG tablet; Take 2 tablets (500 mg) on  Day 1,  followed by 1 tablet (250 mg) once daily on Days 2 through 5. -     benzonatate (TESSALON PERLES) 100 MG capsule; Take 1 capsule (100 mg total) by mouth every 6 (six) hours as needed for cough. -     promethazine-dextromethorphan (PROMETHAZINE-DM) 6.25-15 MG/5ML syrup; Take 5 mLs by mouth 4 (four) times daily as needed for cough.

## 2023-08-11 ENCOUNTER — Ambulatory Visit: Payer: 59 | Admitting: Nurse Practitioner

## 2023-08-11 NOTE — Progress Notes (Deleted)
FOLLOW UP  Assessment and Plan:  Hyperlipidemia Currently on Zetia 10 mg daily and working on diet and exercise Continue low cholesterol diet and exercise.  Check lipid panel.  -     Lipid panel  Attention deficit disorder (ADD) without hyperactivity Currently controlled without medication Helps with focus, no AE's. The patient was counseled on the addictive nature of the medication and was encouraged to take drug holidays when not needed.   Chronic low back pain, unspecified back pain laterality, with sciatica presence unspecified       -     Chronic tailbone pain- numbness in buttocks bilaterally and burning pain in buttocks  Migraine without aura and without status migrainosus, not intractable Uses Excedrin migraine and Nurtec as needed -     Magnesium  Moderate episode of recurrent major depressive disorder in remission [facility]) She reports off of medications and doing much better; remission; monitor; failed numerous in the past, most recently on pristiq which worked well  Lifestyle discussed: diet/exerise, sleep hygiene, stress management, hydration  Insomnia, unspecified type       -      Moderately well managed by current medications, ambien via Dr. Jennette Kettle and melatonin  Overweight- BMI 25 Long discussion about weight loss, diet, and exercise Recommended diet heavy in fruits and veggies and low in animal meats, cheeses, and dairy products, appropriate calorie intake Patient will work on exercise, particularly weight lifting and resistance exercise Excellent progress with mounjaro - sent in 15 mg/week dose High fiber and protein diet, avoid processed carbs, add regular resistance exercises  Follow up at next visit  Vitamin D deficiency Continue Vit D supplementation to maintain value in therapeutic level of 60-100  -     VITAMIN D 25 Hydroxy (Vit-D Deficiency, Fractures)  Allergic state, sequela      -      Well managed by current medications  Nocturnal leg cramps ABI  was normal, CBC, iron has been normal  Doing well with magnesium/heat; increase walking  Medication management -     CBC with Differential/Platelet -     CMP/GFR   Type 2 Diabetes Mellitus without complications Continue medications:Mounjaro 12.5 mg Continue diet and exercise.  Perform daily foot/skin check, notify office of any concerning changes.  Check A1C    Chronic constipation Controlled with "calm" magnesium Encouraged high fiber diet, hydration, regular exercise      Discussed med's effects and SE's. Screening labs and tests as requested with regular follow-up as recommended. Over 40 minutes of exam, counseling, chart review, and complex, high level critical decision making was performed this visit.   Future Appointments  Date Time Provider Department Center  08/11/2023  9:30 AM Raynelle Dick, NP [facility] None  10/26/2023 12:00 PM [facility] DX DEXA 1 [facility]  11/21/2023  2:00 PM Raynelle Dick, NP [facility] None    HPI  57 y.o. married Caucasian female  presents for follow up for has Insomnia; Vitamin D deficiency; ADD (attention deficit disorder); Migraines; Allergy; Chronic lumbar pain; Nocturnal leg cramps; Recurrent major depression in remission (HCC); BMI 24.0-24.9, adult; Osteopenia; Mixed hyperlipidemia; Chronic constipation; B12 deficiency; and Hyperinsulinemia on their problem list..   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 Enjoys spending time with grandkids.   Follows with GYN - Dr. Rana Snare - for PAPs. He is prescribing estradiol and progesterone. She is on bASA. Gets mammograms at breast center. Colonoscopy is UTD  She is having numbness/pins and needles/burning pain in buttocks. Was seen by [facility] and he said there was a cyst on coccyx.  She does want a second opinion at neurosurgery. She also has lumbar scoliosis  Hx of depression in remission, did well on pristiq at one  point. She has used meds in the past but feels symptoms are controlled without medications She has dx of moderate depression, has failed many agents, lexapro, zoloft, wellbutrin, did well with pristiq for 18 months but stopped and reprots is actually doing fairly with just her night time sleeping medication (ambien 5 mg via Dr. Rana Snare).   She reports she has been followed by ortho for cervical facet issues that seem to be contributing to "migraines" - tension headaches, triggered by stress, sinus. Takes tylenol, sinus med PRN, occasional nurtec samples. Migraines are currently occurring 2 times a month.  Nurtec does relieve symptoms  She has chronic intermittent lumbar pain attributed to sedentary job, ortho follows. Also restless legs, "calm" magnesium gummies have helped. Heating blanket at night. Symptoms are controlled with magnesium  BMI is There is no height or weight on file to calculate BMI., she has been working on diet; she is active with her grandkids. Weight has been an ongoing struggle. She has failed phentermine/topamax. She has been on mounjaro with coupon, now on 12.5 mg, tolerates well  Wt Readings from Last 3 Encounters:  12/07/22 150 lb (68 kg)  11/24/22 150 lb (68 kg)  11/08/22 148 lb 12.8 oz (67.5 kg)   Today their BP is    BP Readings from Last 3 Encounters:  08/07/23 (!) 90/56  12/07/22 110/62  11/24/22 98/62  She does workout. She denies chest pain, shortness of breath, dizziness.   She is not on cholesterol medication. Her cholesterol is not at goal. The cholesterol last visit was:   Lab Results  Component Value Date   CHOL 163 11/24/2022   HDL 62 11/24/2022   LDLCALC 84 11/24/2022   TRIG 79 11/24/2022   CHOLHDL 2.6 11/24/2022    Last A1C in the office was:  Lab Results  Component Value Date   HGBA1C 4.9 11/24/2022   Last GFR: Lab Results  Component Value Date   EGFR 77 11/24/2022    Patient is on Vitamin D supplement and at goal, taking reduced dose vit  D 2000 IU with K2 combo.  Lab Results  Component Value Date   VD25OH 45 11/24/2022     She reports is not currently on a supplement -  Lab Results  Component Value Date   VITAMINB12 444 09/08/2021      Current Medications:  Current Outpatient Medications on File Prior to Visit  Medication Sig Dispense Refill   azithromycin (ZITHROMAX) 250 MG tablet Take 2 tablets (500 mg) on  Day 1,  followed by 1 tablet (250 mg) once daily on Days 2 through 5. 6 each 1   B Complex-Biotin-FA (B-COMPLEX PO) Take by mouth.     benzonatate (TESSALON PERLES) 100 MG capsule Take 1 capsule (100 mg total) by mouth every 6 (six) hours as needed for cough. 30 capsule 1   Cholecalciferol (VITAMIN D PO) Take 10,000 Units by mouth.      dexamethasone (DECADRON) 4 MG tablet Take 3 tabs for 3 days, 2 tabs for 3 days 1 tab for 5 days. Take with food. 20 tablet 0   DM-Phenylephrine-Acetaminophen (ALKA-SELTZER PLUS DAY COLD/FLU PO) Take by mouth.     estradiol (ESTRACE) 2 MG  tablet Take 2 mg by mouth daily.     ezetimibe (ZETIA) 10 MG tablet Take 1 tablet (10 mg total) by mouth daily. 30 tablet 11   gabapentin (NEURONTIN) 100 MG capsule Take 100 mg by mouth 3 (three) times daily.     lidocaine (LIDODERM) 5 % PLACE 1 PATCH EVERY 12 HOURS. REMOVE AND DISCARD PATCH AFTER 12 HOURS (Patient not taking: Reported on 08/07/2023) 6 patch 0   MAGNESIUM PO Take by mouth. Takess 4 gummies daily     meloxicam (MOBIC) 15 MG tablet Take 15 mg by mouth daily as needed. (Patient not taking: Reported on 08/07/2023)     Menaquinone-7 (VITAMIN K2 PO) Take by mouth.     methocarbamol (ROBAXIN) 500 MG tablet TAKE 1 TABLET(500 MG) BY MOUTH EVERY 6 HOURS AS NEEDED FOR MUSCLE SPASMS (Patient not taking: Reported on 08/07/2023) 60 tablet 1   MOUNJARO 12.5 MG/0.5ML Pen INJECT 1 PEN EVERY SEVEN DAYS 6 mL 0   omeprazole (PRILOSEC) 20 MG capsule Take 20 mg by mouth daily.     ondansetron (ZOFRAN) 4 MG tablet TAKE 1 TABLET(4 MG) BY MOUTH DAILY AS  NEEDED FOR NAUSEA OR VOMITING 30 tablet 1   Phenyleph-Doxyl-DM-Aspirin (ALKA-SELTZER PLUS COLD DY/NGHT PO) Take by mouth.     progesterone (PROMETRIUM) 100 MG capsule Take 100 mg by mouth at bedtime.     promethazine-dextromethorphan (PROMETHAZINE-DM) 6.25-15 MG/5ML syrup Take 5 mLs by mouth 4 (four) times daily as needed for cough. 240 mL 1   triamcinolone cream (KENALOG) 0.1 % Apply topically 2 (two) times daily. 30 g 2   zolpidem (AMBIEN) 10 MG tablet Take 10 mg by mouth at bedtime as needed.     No current facility-administered medications on file prior to visit.   Allergies:  Allergies  Allergen Reactions   Percocet [Oxycodone-Acetaminophen]    Medical History:  She has Insomnia; Vitamin D deficiency; ADD (attention deficit disorder); Migraines; Allergy; Chronic lumbar pain; Nocturnal leg cramps; Recurrent major depression in remission (HCC); BMI 24.0-24.9, adult; Osteopenia; Mixed hyperlipidemia; Chronic constipation; B12 deficiency; and Hyperinsulinemia on their problem list.   Patient Care Team: Lucky Cowboy, MD as PCP - General (Internal Medicine) Judd Gaudier, NP as Nurse Practitioner (Nurse Practitioner) Javier Glazier (Physician Assistant)  Surgical History:  She has a past surgical history that includes Gynecologic cryosurgery; Novasure ablation; Oophorectomy (Right); Breast enhancement surgery (Bilateral, 1991); Parotidectomy (Left, 09/2008); Breast biopsy (Left, 2018); Tooth extraction (Right, 05/24/2018); Augmentation mammaplasty (Bilateral); Shoulder adhesion release (Right, 01/14/2016); Shoulder adhesion release (Left, 2012); and Shoulder arthroscopy (Right, 05/30/2020). Family History:  Herfamily history includes Breast cancer in her maternal aunt, maternal grandmother, and paternal aunt; Cancer in her father; Depression in her mother; Diabetes in her brother; Heart disease in her father, maternal grandfather, and paternal grandfather; Hypertension in her  father; Stroke in her paternal grandfather; Suicidality in her paternal grandmother; Tongue cancer in her father. Social History:  She reports that she has never smoked. She has never used smokeless tobacco. She reports current alcohol use. She reports that she does not use drugs.   Review of Systems: Review of Systems  Constitutional:  Negative for chills, diaphoresis, fever, malaise/fatigue and weight loss.  HENT:  Negative for ear pain, hearing loss and tinnitus.   Eyes:  Negative for blurred vision, double vision, photophobia, pain and redness.  Respiratory:  Negative for cough, sputum production, shortness of breath and wheezing.   Cardiovascular:  Negative for chest pain, palpitations, orthopnea, claudication and leg swelling.  Gastrointestinal:  Positive for constipation (improved with magnesium). Negative for abdominal pain, blood in stool, diarrhea, heartburn, melena, nausea and vomiting.  Genitourinary: Negative.  Negative for dysuria and urgency.  Musculoskeletal:  Positive for back pain (low back/sacral- burning with pins/needles). Negative for joint pain, myalgias and neck pain.  Skin:  Negative for rash.  Neurological:  Positive for tingling (buttocks). Negative for dizziness, sensory change, weakness and headaches.  Endo/Heme/Allergies:  Positive for environmental allergies. Negative for polydipsia.  Psychiatric/Behavioral:  Negative for depression, memory loss, substance abuse and suicidal ideas. The patient has insomnia (managed with med from GYN). The patient is not nervous/anxious.   All other systems reviewed and are negative.   Physical Exam: Estimated body mass index is 25.75 kg/m as calculated from the following:   Height as of 08/07/23: 5\' 4"  (1.626 m).   Weight as of 12/07/22: 150 lb (68 kg). There were no vitals taken for this visit. General Appearance: Well nourished, in no apparent distress.  Eyes: PERRLA, EOMs, erythema of conjunctiva bilaterally Sinuses: No  Frontal/maxillary tenderness  ENT/Mouth: Ext aud canals clear, normal light reflex with TMs without erythema, bulging. Good dentition. No erythema, swelling, or exudate on post pharynx. Tonsils not swollen or erythematous. Hearing normal.  Neck: Supple, thyroid normal. No bruits  Respiratory: Respiratory effort normal, BS equal bilaterally without rales, rhonchi, wheezing or stridor.  Cardio: RRR without murmurs, rubs or gallops. Distal pulses symmetrical and intact.  Chest: symmetric, with normal excursions and percussion.  Abdomen: Soft, BS x 4, no tenderness, no guarding, rebound, hernias, masses, or organomegaly.  Lymphatics: Non tender without lymphadenopathy.  Musculoskeletal: Full ROM all peripheral extremities,5/5 strength, and normal gait.  Skin: Warm, dry without rashes, ecchymosis, lesions.  Neuro: Cranial nerves intact, reflexes diminished throughout though equal bilaterally. Normal muscle tone, no cerebellar symptoms. Sensation intact.  Psych: Awake and oriented X 3, normal affect, Insight and Judgment appropriate.     Raynelle Dick, NP 6:31 AM [facility]

## 2023-08-21 ENCOUNTER — Encounter: Payer: 59 | Admitting: Nurse Practitioner

## 2023-08-21 ENCOUNTER — Other Ambulatory Visit: Payer: Self-pay | Admitting: Nurse Practitioner

## 2023-08-21 DIAGNOSIS — M542 Cervicalgia: Secondary | ICD-10-CM

## 2023-08-21 MED ORDER — CYCLOBENZAPRINE HCL 5 MG PO TABS
5.0000 mg | ORAL_TABLET | Freq: Three times a day (TID) | ORAL | 0 refills | Status: DC | PRN
Start: 2023-08-21 — End: 2024-02-28

## 2023-08-28 ENCOUNTER — Ambulatory Visit: Payer: 59 | Admitting: Nurse Practitioner

## 2023-09-07 ENCOUNTER — Ambulatory Visit: Payer: 59 | Admitting: Nurse Practitioner

## 2023-09-20 ENCOUNTER — Ambulatory Visit: Payer: 59 | Admitting: Nurse Practitioner

## 2023-09-27 ENCOUNTER — Encounter: Payer: 59 | Admitting: Nurse Practitioner

## 2023-10-06 ENCOUNTER — Other Ambulatory Visit: Payer: Self-pay

## 2023-10-06 DIAGNOSIS — R110 Nausea: Secondary | ICD-10-CM

## 2023-10-06 MED ORDER — ONDANSETRON HCL 4 MG PO TABS
4.0000 mg | ORAL_TABLET | Freq: Three times a day (TID) | ORAL | 1 refills | Status: DC | PRN
Start: 2023-10-06 — End: 2024-02-28

## 2023-10-06 MED ORDER — MOUNJARO 12.5 MG/0.5ML ~~LOC~~ SOAJ: 12.5000 mg | SUBCUTANEOUS | 1 refills | Status: DC

## 2023-10-26 ENCOUNTER — Other Ambulatory Visit: Payer: 59

## 2023-11-02 ENCOUNTER — Other Ambulatory Visit: Payer: Self-pay | Admitting: Obstetrics and Gynecology

## 2023-11-02 DIAGNOSIS — Z13820 Encounter for screening for osteoporosis: Secondary | ICD-10-CM

## 2023-11-20 ENCOUNTER — Other Ambulatory Visit (HOSPITAL_COMMUNITY): Payer: Self-pay

## 2023-11-20 ENCOUNTER — Telehealth: Payer: Self-pay

## 2023-11-20 NOTE — Telephone Encounter (Signed)
 Pharmacy sent a PA request for [facility] 12.5MG /0.5ML auto-injectors, pt not covered under [employer]. After further investigation we were not able to find any active coverage. Spoke to pharmacy and they are waiting on patient to bring by information.

## 2023-11-21 ENCOUNTER — Encounter: Payer: 59 | Admitting: Nurse Practitioner

## 2023-11-24 ENCOUNTER — Encounter: Payer: 59 | Admitting: Nurse Practitioner

## 2023-12-20 ENCOUNTER — Other Ambulatory Visit: Payer: Self-pay | Admitting: Obstetrics and Gynecology

## 2023-12-20 DIAGNOSIS — Z1231 Encounter for screening mammogram for malignant neoplasm of breast: Secondary | ICD-10-CM

## 2023-12-24 ENCOUNTER — Encounter: Payer: Self-pay | Admitting: Emergency Medicine

## 2023-12-24 ENCOUNTER — Ambulatory Visit
Admission: EM | Admit: 2023-12-24 | Discharge: 2023-12-24 | Disposition: A | Discharge status: Home / Self Care | Attending: Internal Medicine | Admitting: Internal Medicine

## 2023-12-24 DIAGNOSIS — L03211 Cellulitis of face: Secondary | ICD-10-CM

## 2023-12-24 DIAGNOSIS — H1013 Acute atopic conjunctivitis, bilateral: Secondary | ICD-10-CM | POA: Diagnosis not present

## 2023-12-24 DIAGNOSIS — J309 Allergic rhinitis, unspecified: Secondary | ICD-10-CM | POA: Diagnosis not present

## 2023-12-24 DIAGNOSIS — H5789 Other specified disorders of eye and adnexa: Secondary | ICD-10-CM

## 2023-12-24 MED ORDER — AMOXICILLIN-POT CLAVULANATE 875-125 MG PO TABS: 1.0000 | ORAL_TABLET | Freq: Two times a day (BID) | ORAL | 0 refills | Status: DC

## 2023-12-24 MED ORDER — PREDNISONE 20 MG PO TABS: 40.0000 mg | ORAL_TABLET | Freq: Every day | ORAL | 0 refills | Status: AC

## 2023-12-24 NOTE — Discharge Instructions (Addendum)
 I suspect that your symptoms are due to severe allergies and there is a secondary bacterial infection due to all of the itching and irritation.  Take Augmentin antibiotic twice a day for the next 7 days. Take prednisone  2 pills (40 mg) once daily for the next 5 days. Take prednisone  with food to avoid stomach upset.  Do not take any ibuprofen/other NSAID containing medications when taking prednisone .  Apply 1 drop of olopatadine eyedrops to both eyes every 12 hours.  Perform warm/cool compresses (whichever 1 feels the best) to the eyes.  Continue Zyrtec and Flonase 2 puffs into each nostril once daily.  Schedule a follow-up appointment with [facility] for further workup and evaluation.  [facility] [place] at [facility] [place]. [address] [place],  [place]  [address] [phone]

## 2023-12-24 NOTE — ED Provider Notes (Signed)
 [name]    CSN: [id number] Arrival date & time: 12/24/23  0849      History   Chief Complaint Chief Complaint  Patient presents with  . Eye Problem    HPI Carla Fuller is a 58 y.o. female.   Patient presents to urgent care for evaluation of redness, swelling, and itching to the eyes that started initially approximately 1 month ago and has worsened significantly over the last few days.  Both eyes are puffy, swollen, and with burning/itching watery drainage.  Denies recent trauma/injuries to the eyes.  History of seasonal allergies, she believes this is what is causing her symptoms.  The soft tissue and skin surrounding the eyes is also very red and swollen.  Denies vision changes, dizziness, ear pain, fever, chills, sore throat, contact lens use, nausea, vomiting, and foreign body sensation to the eyes.  She does report some crusty drainage in the mornings from the eyes but states its mostly clear.  Her worst symptom is the itching around her eyes.  She has been using Systane eyedrops topically to both eyes.  She has also been using triamcinolone  steroid cream to the inferior bilateral eyes every other day for the last month for itching.  She takes Zyrtec daily and also uses azelastine  with some relief of itching.  Denies recent antibiotic or steroid use.  No recent contact with potential allergens to her knowledge.    Past Medical History:  Diagnosis Date  . Abnormal cells of cervix    PAP  . ADD (attention deficit disorder)   . Allergy   . Anemia   . Anxiety   . 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 Chronic lumbar pain    Sacrum  . Insomnia   . Migraines   . Plantar fasciitis of right foot 06/21/2018  . Unspecified vitamin D  deficiency     Patient Active Problem List   Diagnosis Date Noted  . B12 deficiency 08/28/2020  . Hyperinsulinemia 08/28/2020  . Chronic constipation 07/04/2019  . Mixed hyperlipidemia 06/24/2019  .  Osteopenia 08/17/2018  . BMI 24.0-24.9, adult 06/20/2018  . Nocturnal leg cramps 06/20/2017  . Recurrent major depression in remission (HCC) 06/20/2017  . Insomnia   . Vitamin D  deficiency   . ADD (attention deficit disorder)   . Migraines   . Allergy   . Chronic lumbar pain     Past Surgical History:  Procedure Laterality Date  . AUGMENTATION MAMMAPLASTY Bilateral   . BREAST BIOPSY Left 2018  . BREAST ENHANCEMENT SURGERY Bilateral 1991   Saline implants  . GYNECOLOGIC CRYOSURGERY    . NOVASURE ABLATION    . OOPHORECTOMY Right   . PAROTIDECTOMY Left 09/2008   benign  . SHOULDER ADHESION RELEASE Right 01/14/2016   Dr Dalldorf  . SHOULDER ADHESION RELEASE Left 2012   Dr. Lorena Rolling  . SHOULDER ARTHROSCOPY Right 05/30/2020   Dr. Arletha Bend repair, with labral tear repair  . TOOTH EXTRACTION Right 05/24/2018   #31 extraction    OB History   No obstetric history on file.      Home Medications    Prior to Admission medications   Medication Sig Start Date End Date Taking? Authorizing Provider  amoxicillin -clavulanate (AUGMENTIN) 875-125 MG tablet Take 1 tablet by mouth every 12 (twelve) hours. 12/24/23  Yes Starlene Eaton, FNP  predniSONE  (DELTASONE ) 20 MG tablet Take 2 tablets (40 mg total) by mouth daily with breakfast for 5 days. 12/24/23 12/29/23 Yes Sai Zinn,  Danny Dye, FNP  azithromycin  (ZITHROMAX ) 250 MG tablet Take 2 tablets (500 mg) on  Day 1,  followed by 1 tablet (250 mg) once daily on Days 2 through 5. Patient not taking: Reported on 12/24/2023 08/07/23   Wilkinson, Dana E, FNP  B Complex-Biotin-FA (B-COMPLEX PO) Take by mouth.    [provider]  benzonatate  (TESSALON  PERLES) 100 MG capsule Take 1 capsule (100 mg total) by mouth every 6 (six) hours as needed for cough. 08/07/23 08/06/24  Wilkinson, Dana E, FNP  Cholecalciferol (VITAMIN D  PO) Take 10,000 Units by mouth.     [provider]  cyclobenzaprine  (FLEXERIL ) 5 MG tablet Take 1  tablet (5 mg total) by mouth 3 (three) times daily as needed for muscle spasms. 08/21/23   Wilkinson, Dana E, FNP  dexamethasone  (DECADRON ) 4 MG tablet Take 3 tabs for 3 days, 2 tabs for 3 days 1 tab for 5 days. Take with food. Patient not taking: Reported on 12/24/2023 08/03/23   Wilkinson, Dana E, FNP  DM-Phenylephrine-Acetaminophen  (ALKA-SELTZER PLUS DAY COLD/FLU PO) Take by mouth.    [provider]  estradiol  (ESTRACE ) 2 MG tablet Take 2 mg by mouth daily.    [provider]  ezetimibe  (ZETIA ) 10 MG tablet Take 1 tablet (10 mg total) by mouth daily. 07/31/23   Wilkinson, Dana E, FNP  gabapentin  (NEURONTIN ) 100 MG capsule Take 100 mg by mouth 3 (three) times daily. Patient not taking: Reported on 12/24/2023    [provider]  lidocaine  (LIDODERM ) 5 % PLACE 1 PATCH EVERY 12 HOURS. REMOVE AND DISCARD [facility] AFTER 12 HOURS Patient not taking: Reported on 08/07/2023 07/31/23   Wilkinson, Dana E, FNP  MAGNESIUM PO Take by mouth. Takess 4 gummies daily    [provider]  meloxicam  (MOBIC ) 15 MG tablet Take 15 mg by mouth daily as needed. Patient not taking: Reported on 08/07/2023 11/15/22   [provider]  Menaquinone-7 (VITAMIN K2 PO) Take by mouth.    [provider]  omeprazole (PRILOSEC) 20 MG capsule Take 20 mg by mouth daily. Patient not taking: Reported on 12/24/2023    [provider]  ondansetron  (ZOFRAN ) 4 MG tablet Take 1 tablet (4 mg total) by mouth every 8 (eight) hours as needed for nausea or vomiting. Patient not taking: Reported on 12/24/2023 10/06/23   Webb, Padonda B, FNP  Phenyleph-Doxyl-DM-Aspirin (ALKA-SELTZER PLUS COLD DY/NGHT PO) Take by mouth.    [provider]  progesterone  (PROMETRIUM ) 100 MG capsule Take 100 mg by mouth at bedtime.    [provider]  promethazine -dextromethorphan (PROMETHAZINE -DM) 6.25-15 MG/5ML syrup Take 5 mLs by mouth 4 (four) times daily as needed for cough. Patient not  taking: Reported on 12/24/2023 08/07/23   Wilkinson, Dana E, FNP  tirzepatide  (MOUNJARO ) 12.5 MG/0.5ML Pen Inject 12.5 mg into the skin once a week. Patient not taking: Reported on 12/24/2023 10/06/23   Webb, Padonda B, FNP  triamcinolone  cream (KENALOG ) 0.1 % Apply topically 2 (two) times daily. 07/31/23   Wilkinson, Dana E, FNP  zolpidem (AMBIEN) 10 MG tablet Take 10 mg by mouth at bedtime as needed.    [provider]    Family History Family History  Problem Relation Age of Onset  . Depression Mother   . Heart disease Father   . Tongue cancer Father        squamous cell carcinoma mets  . Hypertension Father   . Cancer Father        sacrum/lung mets  .  Diabetes Brother   . Breast cancer Maternal Grandmother   . Heart disease Maternal Grandfather   . Suicidality Paternal Grandmother   . Heart disease Paternal Grandfather   . Stroke Paternal Grandfather   . Breast cancer Maternal Aunt   . Breast cancer Paternal Aunt     Social History Social History   Tobacco Use  . Smoking status: Never  . Smokeless tobacco: Never  Vaping Use  . Vaping status: Never Used  Substance Use Topics  . Alcohol use: Yes    Comment: rarely-wine  . Drug use: No     Allergies   Percocet [oxycodone-acetaminophen ]   Review of Systems Review of Systems Per HPI  Physical Exam Triage Vital Signs ED Triage Vitals  Encounter Vitals Group     BP 12/24/23 0853 122/63     Systolic BP Percentile --      Diastolic BP Percentile --      Pulse Rate 12/24/23 0853 88     Resp 12/24/23 0853 17     Temp 12/24/23 0853 97.9 F (36.6 C)     Temp Source 12/24/23 0853 Oral     SpO2 12/24/23 0853 98 %     Weight --      Height --      Head Circumference --      Peak Flow --      Pain Score 12/24/23 0857 6     Pain Loc --      Pain Education --      Exclude from Growth Chart --    No data found.  Updated Vital Signs BP 122/63 (BP Location: Right Arm)   Pulse 88   Temp 97.9 F (36.6  C) (Oral)   Resp 17   SpO2 98%   Visual Acuity Right Eye Distance:   Left Eye Distance:   Bilateral Distance:    Right Eye Near:   Left Eye Near:    Bilateral Near:     Physical Exam Vitals and nursing note reviewed.  Constitutional:      Appearance: She is not ill-appearing or toxic-appearing.  HENT:     Head: Normocephalic and atraumatic. Right periorbital erythema and left periorbital erythema present. No raccoon eyes, Battle's sign, abrasion or contusion. Hair is normal.     Jaw: There is normal jaw occlusion.     Comments: Periorbital erythema and swelling of the bilateral upper eyelids. Non-tender to palpation. Minimal warmth to palpation over the bilateral inferior periorbital regions. No signs of skin excoriation.    Right Ear: Hearing and external ear normal.     Left Ear: Hearing and external ear normal.     Nose: Nose normal. No rhinorrhea.     Mouth/Throat:     Lips: Pink.     Mouth: Mucous membranes are moist. No injury or oral lesions.     Dentition: Normal dentition.     Tongue: No lesions.     Pharynx: Oropharynx is clear. Uvula midline. No pharyngeal swelling, oropharyngeal exudate, posterior oropharyngeal erythema, uvula swelling or postnasal drip.     Tonsils: No tonsillar exudate.  Eyes:     General: Lids are normal. Vision grossly intact. Gaze aligned appropriately. Allergic shiner present.        Right eye: Discharge (scant, watery) present.        Left eye: Discharge (scant, watery) present.    Extraocular Movements: Extraocular movements intact.     Conjunctiva/sclera:     Right eye: Right conjunctiva is injected.  No chemosis, exudate or hemorrhage.    Left eye: Left conjunctiva is injected. No chemosis, exudate or hemorrhage.    Pupils: Pupils are equal, round, and reactive to light.     Visual Fields: Right eye visual fields normal and left eye visual fields normal.     Comments: EOMs intact without pain or dizziness elicited.   Neck:     Trachea:  Trachea and phonation normal.  Pulmonary:     Effort: Pulmonary effort is normal.  Musculoskeletal:     Cervical back: Neck supple.  Lymphadenopathy:     Cervical: No cervical adenopathy.  Skin:    General: Skin is warm and dry.     Capillary Refill: Capillary refill takes less than 2 seconds.     Findings: No rash.  Neurological:     General: No focal deficit present.     Mental Status: She is alert and oriented to person, place, and time. Mental status is at baseline.     Cranial Nerves: No dysarthria or facial asymmetry.  Psychiatric:        Mood and Affect: Mood normal.        Speech: Speech normal.        Behavior: Behavior normal.        Thought Content: Thought content normal.        Judgment: Judgment normal.     UC Treatments / Results  Labs (all labs ordered are listed, but only abnormal results are displayed) Labs Reviewed - No data to display  EKG   Radiology No results found.  Procedures Procedures (including critical care time)  Medications Ordered in UC Medications - No data to display  Initial Impression / Assessment and Plan / UC Course  I have reviewed the triage vital signs and the nursing notes.  Pertinent labs & imaging results that were available during my care of the patient were reviewed by me and considered in my medical decision making (see chart for details).   1. Allergic conjunctivitis of both eyes and rhinitis   *** Final Clinical Impressions(s) / UC Diagnoses   Final diagnoses:  Allergic conjunctivitis of both eyes and rhinitis     Discharge Instructions      I suspect that your symptoms are due to severe allergies and there is a secondary bacterial infection due to all of the itching and irritation.  Take Augmentin antibiotic twice a day for the next 7 days. Take prednisone  2 pills (40 mg) once daily for the next 5 days. Take prednisone  with food to avoid stomach upset.  Do not take any ibuprofen/other NSAID containing  medications when taking prednisone .  Apply 1 drop of olopatadine eyedrops to both eyes every 12 hours.  Perform warm/cool compresses (whichever 1 feels the best) to the eyes.  Continue Zyrtec and Flonase 2 puffs into each nostril once daily.  Schedule a follow-up appointment with [facility] for further workup and evaluation.  [facility] at [facility]. [address] [place],  [place]  [address] [phone]  ED Prescriptions     Medication Sig Dispense Auth. Provider   predniSONE  (DELTASONE ) 20 MG tablet Take 2 tablets (40 mg total) by mouth daily with breakfast for 5 days. 10 tablet Zyana Amaro M, FNP   amoxicillin -clavulanate (AUGMENTIN) 875-125 MG tablet Take 1 tablet by mouth every 12 (twelve) hours. 14 tablet Starlene Eaton, FNP      PDMP not reviewed this encounter.

## 2023-12-24 NOTE — ED Triage Notes (Signed)
 Pt c/o bilateral eye swelling and irritation for over 1 month that has gotten worse last few days.

## 2023-12-25 ENCOUNTER — Ambulatory Visit

## 2024-01-02 ENCOUNTER — Ambulatory Visit
Admission: RE | Admit: 2024-01-02 | Discharge: 2024-01-02 | Disposition: A | Discharge status: Home / Self Care | Source: Ambulatory Visit | Attending: Internal Medicine

## 2024-01-02 VITALS — BP 114/78 | HR 67 | Temp 97.8°F | Resp 17

## 2024-01-02 DIAGNOSIS — L309 Dermatitis, unspecified: Secondary | ICD-10-CM | POA: Diagnosis not present

## 2024-01-02 MED ORDER — PREDNISONE 10 MG PO TABS: ORAL_TABLET | ORAL | 0 refills | Status: AC

## 2024-01-02 NOTE — ED Triage Notes (Addendum)
 Pt seen on 5/11 at [facility] for eye swelling. Returns today for re-evaluation. States her eyes got better but yesterday they started getting red again.

## 2024-01-02 NOTE — ED Provider Notes (Signed)
 [facility]    CSN: [id number] Arrival date & time: 01/02/24  1717      History   Chief Complaint Chief Complaint  Patient presents with   Allergic Reaction    Re evaluate my eyes. Recurrent red, itchy, painful, swollen area around both eyes. - Entered by patient    HPI Carla Fuller is a 58 y.o. female.   Carla Fuller is a 58 y.o. female presenting for chief complaint of facial rash that started approximately 6 weeks ago.  She has had redness and swelling around the eyes as well as watery eye drainage and itching for the last approximately 6 weeks.  She was seen at urgent care on Dec 24, 2023 where she was placed on Augmentin  antibiotic to cover for any secondary bacterial infection as well as prednisone  burst to treat allergic reaction to unknown substance due to itching and irritation of the skin.  She finished the prednisone  3 days ago and states that this helped significantly with the facial rash.  Her skin was clear for 2 days and then the redness and swelling to the eyelids returned yesterday.  Symptoms are much more mild now than they were at urgent care visit 9 days ago.  Swelling is localized to the eyelids and has not spread to the cheeks.  She has continued using olopatadine eyedrops and Systane drops with some relief of eye itching.  Denies recent exposure to new make-ups, powders, etc.  No history of facial eczema.  She has an appointment with [facility]  in a few weeks, then PCP in July, then dermatology appointment in July as well.  She is here for reevaluation due to persistent symptoms.     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   Chronic lumbar pain    Sacrum   Insomnia    Migraines    Plantar fasciitis of right foot 06/21/2018   Unspecified vitamin D   deficiency     Patient Active Problem List   Diagnosis Date Noted   B12 deficiency 08/28/2020   Hyperinsulinemia 08/28/2020   Chronic constipation 07/04/2019   Mixed hyperlipidemia 06/24/2019   Osteopenia 08/17/2018   BMI 24.0-24.9, adult 06/20/2018   Nocturnal leg cramps 06/20/2017   Recurrent major depression in remission (HCC) 06/20/2017   Insomnia    Vitamin D  deficiency    ADD (attention deficit disorder)    Migraines    Allergy    Chronic lumbar pain     Past Surgical History:  Procedure Laterality Date   AUGMENTATION MAMMAPLASTY Bilateral    BREAST BIOPSY Left 2018   BREAST ENHANCEMENT SURGERY Bilateral 1991   Saline implants   GYNECOLOGIC CRYOSURGERY     NOVASURE ABLATION     OOPHORECTOMY Right    PAROTIDECTOMY Left 09/2008   benign   SHOULDER ADHESION RELEASE Right 01/14/2016   Dr Ana Balling   SHOULDER ADHESION RELEASE Left 2012   Dr. Lorena Rolling   SHOULDER ARTHROSCOPY Right 05/30/2020   Dr. Arletha Bend repair, with labral tear repair   TOOTH EXTRACTION Right 05/24/2018   #31 extraction    OB History   No obstetric history on file.      Home Medications    Prior to Admission medications   Medication Sig  Start Date End Date Taking? Authorizing Provider  predniSONE  (DELTASONE ) 10 MG tablet Take 4 tablets (40 mg total) by mouth daily with breakfast for 3 days, THEN 2 tablets (20 mg total) daily with breakfast for 3 days, THEN 1 tablet (10 mg total) daily with breakfast for 3 days. 01/02/24 01/11/24 Yes Starlene Eaton, FNP  amoxicillin -clavulanate (AUGMENTIN ) 875-125 MG tablet Take 1 tablet by mouth every 12 (twelve) hours. 12/24/23   Starlene Eaton, FNP  azithromycin  (ZITHROMAX ) 250 MG tablet Take 2 tablets (500 mg) on  Day 1,  followed by 1 tablet (250 mg) once daily on Days 2 through 5. Patient not taking: Reported on 12/24/2023 08/07/23   Wilkinson, Dana E, FNP  B Complex-Biotin-FA (B-COMPLEX PO) Take by mouth.    [provider]   benzonatate  (TESSALON  PERLES) 100 MG capsule Take 1 capsule (100 mg total) by mouth every 6 (six) hours as needed for cough. 08/07/23 08/06/24  Wilkinson, Dana E, FNP  Cholecalciferol (VITAMIN D  PO) Take 10,000 Units by mouth.     [provider]  cyclobenzaprine  (FLEXERIL ) 5 MG tablet Take 1 tablet (5 mg total) by mouth 3 (three) times daily as needed for muscle spasms. 08/21/23   Wilkinson, Dana E, FNP  dexamethasone  (DECADRON ) 4 MG tablet Take 3 tabs for 3 days, 2 tabs for 3 days 1 tab for 5 days. Take with food. Patient not taking: Reported on 12/24/2023 08/03/23   Wilkinson, Dana E, FNP  DM-Phenylephrine-Acetaminophen  (ALKA-SELTZER PLUS DAY COLD/FLU PO) Take by mouth.    [provider]  estradiol  (ESTRACE ) 2 MG tablet Take 2 mg by mouth daily.    [provider]  ezetimibe  (ZETIA ) 10 MG tablet Take 1 tablet (10 mg total) by mouth daily. 07/31/23   Wilkinson, Dana E, FNP  gabapentin  (NEURONTIN ) 100 MG capsule Take 100 mg by mouth 3 (three) times daily. Patient not taking: Reported on 12/24/2023    [provider]  lidocaine  (LIDODERM ) 5 % PLACE 1 PATCH EVERY 12 HOURS. REMOVE AND DISCARD [facility] AFTER 12 HOURS Patient not taking: Reported on 08/07/2023 07/31/23   Wilkinson, Dana E, FNP  MAGNESIUM PO Take by mouth. Takess 4 gummies daily    [provider]  meloxicam  (MOBIC ) 15 MG tablet Take 15 mg by mouth daily as needed. Patient not taking: Reported on 08/07/2023 11/15/22   [provider]  Menaquinone-7 (VITAMIN K2 PO) Take by mouth.    [provider]  omeprazole (PRILOSEC) 20 MG capsule Take 20 mg by mouth daily. Patient not taking: Reported on 12/24/2023    [provider]  ondansetron  (ZOFRAN ) 4 MG tablet Take 1 tablet (4 mg total) by mouth every 8 (eight) hours as needed for nausea or vomiting. Patient not taking: Reported on 12/24/2023 10/06/23   Webb, Padonda B, FNP  Phenyleph-Doxyl-DM-Aspirin (ALKA-SELTZER PLUS COLD  DY/NGHT PO) Take by mouth.    [provider]  progesterone  (PROMETRIUM ) 100 MG capsule Take 100 mg by mouth at bedtime.    [provider]  promethazine -dextromethorphan (PROMETHAZINE -DM) 6.25-15 MG/5ML syrup Take 5 mLs by mouth 4 (four) times daily as needed for cough. Patient not taking: Reported on 12/24/2023 08/07/23   Wilkinson, Dana E, FNP  tirzepatide  (MOUNJARO ) 12.5 MG/0.5ML Pen Inject 12.5 mg into the skin once a week. Patient not taking: Reported on 12/24/2023 10/06/23   Webb, Padonda B, FNP  triamcinolone  cream (KENALOG ) 0.1 % Apply topically 2 (two) times daily. 07/31/23   Wilkinson, Dana E, FNP  zolpidem (  AMBIEN) 10 MG tablet Take 10 mg by mouth at bedtime as needed.    [provider]    Family History Family History  Problem Relation Age of Onset   Depression Mother    Heart disease Father    Tongue cancer Father        squamous cell carcinoma mets   Hypertension Father    Cancer Father        sacrum/lung mets   Diabetes Brother    Breast cancer Maternal Grandmother    Heart disease Maternal Grandfather    Suicidality Paternal Grandmother    Heart disease Paternal Grandfather    Stroke Paternal Grandfather    Breast cancer Maternal Aunt    Breast cancer Paternal Aunt     Social History Social History   Tobacco Use   Smoking status: Never   Smokeless tobacco: Never  Vaping Use   Vaping status: Never Used  Substance Use Topics   Alcohol use: Yes    Comment: rarely-wine   Drug use: No     Allergies   Percocet [oxycodone-acetaminophen ]   Review of Systems Review of Systems Per HPI  Physical Exam Triage Vital Signs ED Triage Vitals  Encounter Vitals Group     BP 01/02/24 1735 114/78     Systolic BP Percentile --      Diastolic BP Percentile --      Pulse Rate 01/02/24 1735 67     Resp 01/02/24 1735 17     Temp 01/02/24 1735 97.8 F (36.6 C)     Temp Source 01/02/24 1735 Oral     SpO2 01/02/24 1735 98 %     Weight --       Height --      Head Circumference --      Peak Flow --      Pain Score 01/02/24 1738 0     Pain Loc --      Pain Education --      Exclude from Growth Chart --    No data found.  Updated Vital Signs BP 114/78 (BP Location: Right Arm)   Pulse 67   Temp 97.8 F (36.6 C) (Oral)   Resp 17   SpO2 98%   Visual Acuity Right Eye Distance:   Left Eye Distance:   Bilateral Distance:    Right Eye Near:   Left Eye Near:    Bilateral Near:     Physical Exam Vitals and nursing note reviewed.  Constitutional:      Appearance: She is not ill-appearing or toxic-appearing.  HENT:     Head: Normocephalic and atraumatic.     Jaw: There is normal jaw occlusion.     Right Ear: Hearing and external ear normal.     Left Ear: Hearing and external ear normal.     Nose: Nose normal.     Mouth/Throat:     Lips: Pink.  Eyes:     General: Lids are normal. Vision grossly intact. Gaze aligned appropriately.     Extraocular Movements: Extraocular movements intact.     Conjunctiva/sclera: Conjunctivae normal.     Pupils: Pupils are equal, round, and reactive to light.     Comments: EOMs intact without pain or dizziness elicited. Subtle erythema and swelling to the upper eyelids bilaterally.  Mildly tender to palpation, no warmth to palpation.   Pulmonary:     Effort: Pulmonary effort is normal.  Musculoskeletal:     Cervical back: Neck supple.  Skin:  General: Skin is warm and dry.     Capillary Refill: Capillary refill takes less than 2 seconds.     Findings: No rash.  Neurological:     General: No focal deficit present.     Mental Status: She is alert and oriented to person, place, and time. Mental status is at baseline.     Cranial Nerves: No dysarthria or facial asymmetry.  Psychiatric:        Mood and Affect: Mood normal.        Speech: Speech normal.        Behavior: Behavior normal.        Thought Content: Thought content normal.        Judgment: Judgment normal.       UC Treatments / Results  Labs (all labs ordered are listed, but only abnormal results are displayed) Labs Reviewed - No data to display  EKG   Radiology No results found.  Procedures Procedures (including critical care time)  Medications Ordered in UC Medications - No data to display  Initial Impression / Assessment and Plan / UC Course  I have reviewed the triage vital signs and the nursing notes.  Pertinent labs & imaging results that were available during my care of the patient were reviewed by me and considered in my medical decision making (see chart for details).   1.  Facial dermatitis Redness and swelling have improved significantly on exam when compared to presentation at urgent care visit 9 days ago. Question facial eczema, she is unsure if symptoms worsen or improve with prolonged exposure to hot water/heat and moisture. She has never had facial eczema in the past.  Symptoms responded well to use of steroid burst previously. She has a history of osteopenia (T-score 1.6/1.7 from 2022). She is steroid nave and does not frequently take steroids for other health problems, therefore I am comfortable prescribing 1 more taper for her to see if this helps further reduce inflammation to the face.   Advised to continue using Aquaphor as needed for moisture. No steroid creams to the face due to risk of skin thinning.  Prednisone  40 mg once daily for 3 days, then 20 mg for 3 days, then 10 mg for 3 days ordered.  Encouraged to get on cancellation list with dermatologist for further evaluation.  Infection return precautions discussed. No makeup or products to the face other than Aquaphor until this clears.  Counseled patient on potential for adverse effects with medications prescribed/recommended today, strict ER and return-to-clinic precautions discussed, patient verbalized understanding.    Final Clinical Impressions(s) / UC Diagnoses   Final diagnoses:  Facial  dermatitis   Discharge Instructions   None    ED Prescriptions     Medication Sig Dispense Auth. Provider   predniSONE  (DELTASONE ) 10 MG tablet Take 4 tablets (40 mg total) by mouth daily with breakfast for 3 days, THEN 2 tablets (20 mg total) daily with breakfast for 3 days, THEN 1 tablet (10 mg total) daily with breakfast for 3 days. 21 tablet Starlene Eaton, FNP      PDMP not reviewed this encounter.   Starlene Eaton, [place] 01/02/24 1845

## 2024-01-03 ENCOUNTER — Telehealth: Payer: Self-pay

## 2024-01-09 DIAGNOSIS — L245 Irritant contact dermatitis due to other chemical products: Secondary | ICD-10-CM | POA: Diagnosis not present

## 2024-01-09 DIAGNOSIS — L309 Dermatitis, unspecified: Secondary | ICD-10-CM | POA: Diagnosis not present

## 2024-01-22 ENCOUNTER — Ambulatory Visit: Admitting: Internal Medicine

## 2024-01-24 DIAGNOSIS — L309 Dermatitis, unspecified: Secondary | ICD-10-CM | POA: Diagnosis not present

## 2024-01-24 DIAGNOSIS — L239 Allergic contact dermatitis, unspecified cause: Secondary | ICD-10-CM | POA: Diagnosis not present

## 2024-02-20 NOTE — Progress Notes (Unsigned)
 New Patient Note  RE: Carla Fuller MRN: [id number] DOB: 04-25-1966 Date of Office Visit: 02/21/2024  Consult requested by: Enedelia Dorna HERO, * Primary care provider: Patient, [name]  Chief Complaint: No chief complaint on file.  History of Present Illness: I had the pleasure of seeing Carla Fuller for initial evaluation at the [facility] on 02/21/2024. She is a 58 y.o. female, who is referred here by Patient, [name] for the evaluation of allergic conjunctivitis.  Discussed the use of AI scribe software for clinical note transcription with the patient, who gave verbal consent to proceed.  History of Present Illness             She reports symptoms of ***. Symptoms have been going on for *** years. The symptoms are present *** all year around with worsening in ***. Other triggers include exposure to ***. Anosmia: ***. Headache: ***. She has used *** with ***fair improvement in symptoms. Sinus infections: ***. Previous work up includes: ***. Previous ENT evaluation: ***. Previous sinus imaging: ***. History of nasal polyps: ***. Last eye exam: ***. History of reflux: ***.  12/24/2023 UC visit: Patient presents to urgent care for evaluation of redness, swelling, and itching to the eyes that started initially approximately 1 month ago and has worsened significantly over the last few days.  Both eyes are puffy, swollen, and with burning/itching watery drainage.  Denies recent trauma/injuries to the eyes.  History of seasonal allergies, she believes this is what is causing her symptoms.  The soft tissue and skin surrounding the eyes is also very red and swollen.  Denies vision changes, dizziness, ear pain, fever, chills, sore throat, contact lens use, nausea, vomiting, and foreign body sensation to the eyes.  She does report some crusty drainage in the mornings from the eyes but states its mostly clear.  Her worst symptom is the itching around her eyes.   She has been using Systane eyedrops topically to both eyes.  She has also been using triamcinolone  steroid cream to the inferior bilateral eyes every other day for the last month for itching.  She takes Zyrtec daily and also uses azelastine  with some relief of itching.  Denies recent antibiotic or steroid use.  No recent contact with potential allergens to her knowledge.  Assessment and Plan: Carla Fuller is a 58 y.o. female with: ***  Assessment and Plan               No follow-ups on file.  No orders of the defined types were placed in this encounter.  Lab Orders  No laboratory test(s) ordered today    Other allergy screening: Asthma: {Blank single:19197::yes,no} Rhino conjunctivitis: {Blank single:19197::yes,no} Food allergy: {Blank single:19197::yes,no} Medication allergy: {Blank single:19197::yes,no} Hymenoptera allergy: {Blank single:19197::yes,no} Urticaria: {Blank single:19197::yes,no} Eczema:{Blank single:19197::yes,no} History of recurrent infections suggestive of immunodeficency: {Blank single:19197::yes,no}  Diagnostics: Spirometry:  Tracings reviewed. Her effort: {Blank single:19197::Good reproducible efforts.,It was hard to get consistent efforts and there is a question as to whether this reflects a maximal maneuver.,Poor effort, data can not be interpreted.} FVC: ***L FEV1: ***L, ***% predicted FEV1/FVC ratio: ***% Interpretation: {Blank single:19197::Spirometry consistent with mild obstructive disease,Spirometry consistent with moderate obstructive disease,Spirometry consistent with severe obstructive disease,Spirometry consistent with possible restrictive disease,Spirometry consistent with mixed obstructive and restrictive disease,Spirometry uninterpretable due to technique,Spirometry consistent with normal pattern,No overt abnormalities noted given today's efforts}.  Please see scanned spirometry results for  details.  Skin Testing: {Blank single:19197::Select foods,Environmental allergy panel,Environmental allergy panel  and select foods,Food allergy panel,None,Deferred due to recent antihistamines use}. *** Results discussed with patient/family.   Past Medical History: Patient Active Problem List   Diagnosis Date Noted  . B12 deficiency 08/28/2020  . Hyperinsulinemia 08/28/2020  . Chronic constipation 07/04/2019  . Mixed hyperlipidemia 06/24/2019  . Osteopenia 08/17/2018  . BMI 24.0-24.9, adult 06/20/2018  . Nocturnal leg cramps 06/20/2017  . Recurrent major depression in remission (HCC) 06/20/2017  . Insomnia   . Vitamin D  deficiency   . ADD (attention deficit disorder)   . Migraines   . Allergy   . Chronic lumbar pain    Past Medical History:  Diagnosis Date  . Abnormal cells of cervix    PAP  . ADD (attention deficit disorder)   . Allergy   . Anemia   . Anxiety   . 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 Chronic lumbar pain    Sacrum  . Insomnia   . Migraines   . Plantar fasciitis of right foot 06/21/2018  . Unspecified vitamin D  deficiency    Past Surgical History: Past Surgical History:  Procedure Laterality Date  . AUGMENTATION MAMMAPLASTY Bilateral   . BREAST BIOPSY Left 2018  . BREAST ENHANCEMENT SURGERY Bilateral 1991   Saline implants  . GYNECOLOGIC CRYOSURGERY    . NOVASURE ABLATION    . OOPHORECTOMY Right   . PAROTIDECTOMY Left 09/2008   benign  . SHOULDER ADHESION RELEASE Right 01/14/2016   Dr Dalldorf  . SHOULDER ADHESION RELEASE Left 2012   Dr. Leonor  . SHOULDER ARTHROSCOPY Right 05/30/2020   Dr. Dozier Greek repair, with labral tear repair  . TOOTH EXTRACTION Right 05/24/2018   #31 extraction   Medication List:  Current Outpatient Medications  Medication Sig Dispense Refill  . amoxicillin -clavulanate (AUGMENTIN ) 875-125 MG tablet Take 1 tablet by mouth every 12 (twelve) hours. 14 tablet 0  . azithromycin   (ZITHROMAX ) 250 MG tablet Take 2 tablets (500 mg) on  Day 1,  followed by 1 tablet (250 mg) once daily on Days 2 through 5. (Patient not taking: Reported on 12/24/2023) 6 each 1  . B Complex-Biotin-FA (B-COMPLEX PO) Take by mouth.    . benzonatate  (TESSALON  PERLES) 100 MG capsule Take 1 capsule (100 mg total) by mouth every 6 (six) hours as needed for cough. 30 capsule 1  . Cholecalciferol (VITAMIN D  PO) Take 10,000 Units by mouth.     . cyclobenzaprine  (FLEXERIL ) 5 MG tablet Take 1 tablet (5 mg total) by mouth 3 (three) times daily as needed for muscle spasms. 60 tablet 0  . dexamethasone  (DECADRON ) 4 MG tablet Take 3 tabs for 3 days, 2 tabs for 3 days 1 tab for 5 days. Take with food. (Patient not taking: Reported on 12/24/2023) 20 tablet 0  . DM-Phenylephrine-Acetaminophen  (ALKA-SELTZER PLUS DAY COLD/FLU PO) Take by mouth.    . estradiol  (ESTRACE ) 2 MG tablet Take 2 mg by mouth daily.    . ezetimibe  (ZETIA ) 10 MG tablet Take 1 tablet (10 mg total) by mouth daily. 30 tablet 11  . gabapentin  (NEURONTIN ) 100 MG capsule Take 100 mg by mouth 3 (three) times daily. (Patient not taking: Reported on 12/24/2023)    . lidocaine  (LIDODERM ) 5 % PLACE 1 PATCH EVERY 12 HOURS. REMOVE AND DISCARD PATCH AFTER 12 HOURS (Patient not taking: Reported on 08/07/2023) 6 patch 0  . MAGNESIUM PO Take by mouth. Takess 4 gummies daily    . meloxicam  (MOBIC ) 15 MG tablet Take 15 mg by mouth  daily as needed. (Patient not taking: Reported on 08/07/2023)    . Menaquinone-7 (VITAMIN K2 PO) Take by mouth.    SABRA omeprazole (PRILOSEC) 20 MG capsule Take 20 mg by mouth daily. (Patient not taking: Reported on 12/24/2023)    . ondansetron  (ZOFRAN ) 4 MG tablet Take 1 tablet (4 mg total) by mouth every 8 (eight) hours as needed for nausea or vomiting. (Patient not taking: Reported on 12/24/2023) 30 tablet 1  . Phenyleph-Doxyl-DM-Aspirin (ALKA-SELTZER PLUS COLD DY/NGHT PO) Take by mouth.    . progesterone  (PROMETRIUM ) 100 MG capsule Take 100  mg by mouth at bedtime.    . promethazine -dextromethorphan (PROMETHAZINE -DM) 6.25-15 MG/5ML syrup Take 5 mLs by mouth 4 (four) times daily as needed for cough. (Patient not taking: Reported on 12/24/2023) 240 mL 1  . tirzepatide  (MOUNJARO ) 12.5 MG/0.5ML Pen Inject 12.5 mg into the skin once a week. (Patient not taking: Reported on 12/24/2023) 6 mL 1  . triamcinolone  cream (KENALOG ) 0.1 % Apply topically 2 (two) times daily. 30 g 2  . zolpidem (AMBIEN) 10 MG tablet Take 10 mg by mouth at bedtime as needed.     No current facility-administered medications for this visit.   Allergies: Allergies  Allergen Reactions  . Percocet Alma.Arms ]    Social History: Social History   Socioeconomic History  . Marital status: Married    Spouse name: Not on file  . Number of children: 0  . Years of education: Not on file  . Highest education level: Not on file  Occupational History  . Occupation: Insurance account manager: [employer]  Tobacco Use  . Smoking status: Never  . Smokeless tobacco: Never  Vaping Use  . Vaping status: Never Used  Substance and Sexual Activity  . Alcohol use: Yes    Comment: rarely-wine  . Drug use: No  . Sexual activity: Yes    Partners: Male    Birth control/protection: Post-menopausal  Other Topics Concern  . Not on file  Social History Narrative  . Not on file   Social Drivers of Health   Financial Resource Strain: Not on file  Food Insecurity: Not on file  Transportation Needs: Not on file  Physical Activity: Insufficiently Active (06/21/2018)   Exercise Vital Sign   . Days of Exercise per Week: 3 days   . Minutes of Exercise per Session: 20 min  Stress: Stress Concern Present (06/21/2018)   [employer] of Occupational Health - Occupational Stress Questionnaire   . Feeling of Stress : To some extent  Social Connections: Not on file   Lives in a ***. Smoking: *** Occupation: ***  Environmental  HistorySurveyor, minerals in the house: Network engineer in the family room: {Blank single:19197::yes,no} Carpet in the bedroom: {Blank single:19197::yes,no} Heating: {Blank single:19197::electric,gas,heat pump} Cooling: {Blank single:19197::central,window,heat pump} Pet: {Blank single:19197::yes ***,no}  Family History: Family History  Problem Relation Age of Onset  . Depression Mother   . Heart disease Father   . Tongue cancer Father        squamous cell carcinoma mets  . Hypertension Father   . Cancer Father        sacrum/lung mets  . Diabetes Brother   . Breast cancer Maternal Grandmother   . Heart disease Maternal Grandfather   . Suicidality Paternal Grandmother   . Heart disease Paternal Grandfather   . Stroke Paternal Grandfather   . Breast cancer Maternal Aunt   . Breast cancer Paternal Aunt    Problem  Relation Asthma                                   *** Eczema                                *** Food allergy                          *** Allergic rhino conjunctivitis     ***  Review of Systems  Constitutional:  Negative for appetite change, chills, fever and unexpected weight change.  HENT:  Negative for congestion and rhinorrhea.   Eyes:  Negative for itching.  Respiratory:  Negative for cough, chest tightness, shortness of breath and wheezing.   Cardiovascular:  Negative for chest pain.  Gastrointestinal:  Negative for abdominal pain.  Genitourinary:  Negative for difficulty urinating.  Skin:  Negative for rash.  Neurological:  Negative for headaches.    Objective: There were no vitals taken for this visit. There is no height or weight on file to calculate BMI. Physical Exam Vitals and nursing note reviewed.  Constitutional:      Appearance: Normal appearance. She is well-developed.  HENT:     Head: Normocephalic and atraumatic.     Right Ear: Tympanic membrane and external  ear normal.     Left Ear: Tympanic membrane and external ear normal.     Nose: Nose normal.     Mouth/Throat:     Mouth: Mucous membranes are moist.     Pharynx: Oropharynx is clear.  Eyes:     Conjunctiva/sclera: Conjunctivae normal.  Cardiovascular:     Rate and Rhythm: Normal rate and regular rhythm.     Heart sounds: Normal heart sounds. No murmur heard.    No friction rub. No gallop.  Pulmonary:     Effort: Pulmonary effort is normal.     Breath sounds: Normal breath sounds. No wheezing, rhonchi or rales.  Musculoskeletal:     Cervical back: Neck supple.  Skin:    General: Skin is warm.     Findings: No rash.  Neurological:     Mental Status: She is alert and oriented to person, place, and time.  Psychiatric:        Behavior: Behavior normal.   The plan was reviewed with the patient/family, and all questions/concerned were addressed.  It was my pleasure to see Carla Fuller today and participate in her care. Please feel free to contact me with any questions or concerns.  Sincerely,  Orlan Cramp, DO Allergy & Immunology  [facility]  [place] office: [phone] [facility] office: [phone]

## 2024-02-21 ENCOUNTER — Other Ambulatory Visit (HOSPITAL_BASED_OUTPATIENT_CLINIC_OR_DEPARTMENT_OTHER): Payer: Self-pay

## 2024-02-21 ENCOUNTER — Ambulatory Visit (INDEPENDENT_AMBULATORY_CARE_PROVIDER_SITE_OTHER): Admitting: Allergy

## 2024-02-21 ENCOUNTER — Ambulatory Visit (INDEPENDENT_AMBULATORY_CARE_PROVIDER_SITE_OTHER): Admitting: Nurse Practitioner

## 2024-02-21 ENCOUNTER — Ambulatory Visit: Payer: Self-pay | Admitting: Nurse Practitioner

## 2024-02-21 ENCOUNTER — Encounter: Payer: Self-pay | Admitting: Allergy

## 2024-02-21 ENCOUNTER — Other Ambulatory Visit (HOSPITAL_COMMUNITY): Payer: Self-pay

## 2024-02-21 ENCOUNTER — Other Ambulatory Visit: Payer: Self-pay

## 2024-02-21 ENCOUNTER — Encounter: Payer: Self-pay | Admitting: Nurse Practitioner

## 2024-02-21 VITALS — BP 118/72 | HR 75 | Temp 97.5°F | Ht 64.0 in | Wt 172.6 lb

## 2024-02-21 VITALS — BP 110/70 | HR 87 | Temp 98.2°F | Resp 18 | Ht 64.17 in | Wt 169.9 lb

## 2024-02-21 DIAGNOSIS — G4700 Insomnia, unspecified: Secondary | ICD-10-CM | POA: Diagnosis not present

## 2024-02-21 DIAGNOSIS — L2389 Allergic contact dermatitis due to other agents: Secondary | ICD-10-CM

## 2024-02-21 DIAGNOSIS — E782 Mixed hyperlipidemia: Secondary | ICD-10-CM | POA: Diagnosis not present

## 2024-02-21 DIAGNOSIS — E161 Other hypoglycemia: Secondary | ICD-10-CM

## 2024-02-21 DIAGNOSIS — G43009 Migraine without aura, not intractable, without status migrainosus: Secondary | ICD-10-CM

## 2024-02-21 DIAGNOSIS — L309 Dermatitis, unspecified: Secondary | ICD-10-CM | POA: Diagnosis not present

## 2024-02-21 DIAGNOSIS — E663 Overweight: Secondary | ICD-10-CM | POA: Diagnosis not present

## 2024-02-21 DIAGNOSIS — E559 Vitamin D deficiency, unspecified: Secondary | ICD-10-CM

## 2024-02-21 DIAGNOSIS — E538 Deficiency of other specified B group vitamins: Secondary | ICD-10-CM | POA: Diagnosis not present

## 2024-02-21 LAB — COMPREHENSIVE METABOLIC PANEL WITH GFR
ALT: 13 U/L (ref 0–35)
AST: 15 U/L (ref 0–37)
Albumin: 4.3 g/dL (ref 3.5–5.2)
Alkaline Phosphatase: 63 U/L (ref 39–117)
BUN: 16 mg/dL (ref 6–23)
CO2: 32 meq/L (ref 19–32)
Calcium: 9.2 mg/dL (ref 8.4–10.5)
Chloride: 101 meq/L (ref 96–112)
Creatinine, Ser: 0.87 mg/dL (ref 0.40–1.20)
GFR: 73.32 mL/min (ref >60.00)
Glucose, Bld: 89 mg/dL (ref 70–99)
Potassium: 3.8 meq/L (ref 3.5–5.1)
Sodium: 138 meq/L (ref 135–145)
Total Bilirubin: 0.4 mg/dL (ref 0.2–1.2)
Total Protein: 6.8 g/dL (ref 6.0–8.3)

## 2024-02-21 LAB — TSH: TSH: 3.56 u[IU]/mL (ref 0.35–5.50)

## 2024-02-21 LAB — LIPID PANEL
Cholesterol: 208 mg/dL — ABNORMAL HIGH (ref 0–200)
HDL: 60.7 mg/dL (ref >39.00)
LDL Cholesterol: 102 mg/dL — ABNORMAL HIGH (ref 0–99)
NonHDL: 147.04
Total CHOL/HDL Ratio: 3
Triglycerides: 227 mg/dL — ABNORMAL HIGH (ref 0.0–149.0)
VLDL: 45.4 mg/dL — ABNORMAL HIGH (ref 0.0–40.0)

## 2024-02-21 LAB — CBC WITH DIFFERENTIAL/PLATELET
Basophils Absolute: 0 K/uL (ref 0.0–0.1)
Basophils Relative: 0.7 % (ref 0.0–3.0)
Eosinophils Absolute: 0.1 K/uL (ref 0.0–0.7)
Eosinophils Relative: 1.5 % (ref 0.0–5.0)
HCT: 40.1 % (ref 36.0–46.0)
Hemoglobin: 13.6 g/dL (ref 12.0–15.0)
Lymphocytes Relative: 36.4 % (ref 12.0–46.0)
Lymphs Abs: 2.3 K/uL (ref 0.7–4.0)
MCHC: 33.8 g/dL (ref 30.0–36.0)
MCV: 89.5 fl (ref 78.0–100.0)
Monocytes Absolute: 0.5 K/uL (ref 0.1–1.0)
Monocytes Relative: 7.1 % (ref 3.0–12.0)
Neutro Abs: 3.5 K/uL (ref 1.4–7.7)
Neutrophils Relative %: 54.3 % (ref 43.0–77.0)
Platelets: 416 K/uL — ABNORMAL HIGH (ref 150.0–400.0)
RBC: 4.48 Mil/uL (ref 3.87–5.11)
RDW: 13 % (ref 11.5–15.5)
WBC: 6.4 K/uL (ref 4.0–10.5)

## 2024-02-21 LAB — VITAMIN B12: Vitamin B-12: 293 pg/mL (ref 211–911)

## 2024-02-21 LAB — VITAMIN D 25 HYDROXY (VIT D DEFICIENCY, FRACTURES): VITD: 43.74 ng/mL (ref 30.00–100.00)

## 2024-02-21 LAB — HEMOGLOBIN A1C: Hgb A1c MFr Bld: 5.3 % (ref 4.6–6.5)

## 2024-02-21 MED ORDER — PHENTERMINE-TOPIRAMATE ER 3.75-23 MG PO CP24
1.0000 | ORAL_CAPSULE | Freq: Every day | ORAL | 0 refills | Status: DC
  Filled 2024-02-21 (×2): qty 30, 30d supply, fill #0

## 2024-02-21 NOTE — Assessment & Plan Note (Signed)
 Chronic, stable. Continue ambien 10mg  at bedtime as needed.

## 2024-02-21 NOTE — Assessment & Plan Note (Signed)
 She has a history of low B12. Check B12 levels and treat based on results.

## 2024-02-21 NOTE — Assessment & Plan Note (Signed)
Check vitamin D and treat based on results.  ?

## 2024-02-21 NOTE — Assessment & Plan Note (Signed)
 BMI 29. She experienced weight gain after discontinuing Mounjaro . Discussed appetite management options and chose Qsymia  due to prior success with phentermine . Explained potential side effects. Prescribe Qsymia , starting with a low dose for one month, then increase. Encourage physical activity for at least 150 minutes weekly. Recommend tracking foods and limiting snacking. Follow-up in 4-6  weeks.

## 2024-02-21 NOTE — Assessment & Plan Note (Signed)
 Insulin  levels were elevated in the past. Check A1c today.

## 2024-02-21 NOTE — Assessment & Plan Note (Signed)
 Monthly migraines are managed with Nurtec as needed. Continue using Nurtec 75mg  as needed.

## 2024-02-21 NOTE — Assessment & Plan Note (Signed)
 Chronic, stable. She is currently taking zetia  10mg  daily. Check CMP, CBC, lipid panel today and adjust regimen based on results.

## 2024-02-21 NOTE — Patient Instructions (Signed)
 It was great to see you!  We are checking your labs today and will let you know the results via mychart/phone.   Start qsymia  1 capsule daily   Start pepcid 1 tablet daily to help with allergies (famotidine 20mg )  Let's follow-up in 4-6 weeks, sooner if you have concerns.  If a referral was placed today, you will be contacted for an appointment. Please note that routine referrals can sometimes take up to 3-4 weeks to process. Please call our office if you haven't heard anything after this time frame.  Take care,  Tinnie Harada, NP

## 2024-02-21 NOTE — Progress Notes (Signed)
 New Patient Visit  BP 118/72 (BP Location: Left Arm, Patient Position: Sitting, Cuff Size: Normal)   Pulse 75   Temp (!) 97.5 F (36.4 C)   Ht 5' 4 (1.626 m)   Wt 172 lb 9.6 oz (78.3 kg)   SpO2 98%   BMI 29.63 kg/m    Subjective:    Patient ID: Carla Fuller, female    DOB: Sep 02, 1965, 57 y.o.   MRN: [id number]  CC: Chief Complaint  Patient presents with   Establish Care    NP. Est. Care, concerns with weight, patient is fasting    HPI: Carla Fuller is a 58 y.o. female presents for new patient visit to establish care.  Introduced to Publishing rights manager role and practice setting.  All questions answered.  Discussed provider/patient relationship and expectations.  Discussed the use of AI scribe software for clinical note transcription with the patient, who gave verbal consent to proceed.  History of Present Illness   Carla Fuller is a 58 year old female who presents to establish care and discuss weight management.  She has experienced weight gain, which she attributes to a sedentary job and increased snacking. She previously lost weight with Mounjaro  injections but regained weight after discontinuing the medication. She remains active, using a treadmill and weights, but struggles with evening hunger.  She has acute eye symptoms since Mother's Day, including a sensation of 'sandpaper', swelling, redness, and flakiness. This is on the skin around her eyes and not the eyes themselves. She uses Aveeno face wash, Gold Bond, Vaseline, and takes Claritin and Zyrtec. She has an appointment with allergist today.   She is on progesterone  and estradiol  therapy following early menopause. She has a history of multiple shoulder dislocations and surgeries. She had COVID-19 in December 2024. Anemia was managed with B vitamins, but she is not taking supplements currently due to her eye condition.  She experiences migraines monthly, with symptoms of severe eye pain, light and noise sensitivity,  and finds relief in a dark, quiet place. She uses Nurtec for management.   She has a history of insomnia and is taking ambien 10mg  at night as needed.    She has a history of hyperlipidemia and is taking zetia  10mg  daily. She has not taken a statin in the past. She denies chest pain and shortness of breath.     Depression and Anxiety Screen done:     02/21/2024    8:43 AM 09/08/2021    3:30 PM 08/27/2020    2:27 PM 07/04/2019   12:35 PM 06/21/2018   10:23 AM  Depression screen PHQ 2/9  Decreased Interest 1 0 1 0 0  Down, Depressed, Hopeless 0 0 1 1 1   PHQ - 2 Score 1 0 2 1 1   Altered sleeping 0  1    Tired, decreased energy 1  0    Change in appetite 3  0    Feeling bad or failure about yourself  0  0    Trouble concentrating 0  0    Moving slowly or fidgety/restless 0  0    Suicidal thoughts 0  0    PHQ-9 Score 5  3    Difficult doing work/chores Somewhat difficult  Not difficult at all        02/21/2024    8:43 AM  GAD 7 : Generalized Anxiety Score  Nervous, Anxious, on Edge 0  Control/stop worrying 0  Worry too much - different things 0  Trouble relaxing  0  Restless 0  Easily annoyed or irritable 1  Afraid - awful might happen 0  Total GAD 7 Score 1  Anxiety Difficulty Not difficult at all    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 Leonor   SHOULDER ARTHROSCOPY Right 05/30/2020   Dr. Dozier Greek repair, with labral tear repair   TOOTH EXTRACTION Right 05/24/2018   #31 extraction    Family History  Problem Relation Age of Onset   Depression Mother    Heart disease Father    Tongue cancer Father        squamous cell carcinoma mets   Hypertension Father    Cancer Father        sacrum/lung mets   Diabetes Brother    Breast cancer Maternal Aunt    Breast cancer Paternal Aunt    Depression Maternal Grandmother    Breast cancer Maternal Grandmother    Heart disease Maternal Grandfather    Suicidality Paternal Grandmother    Heart disease Paternal Grandfather    Stroke Paternal Grandfather      Social History   Tobacco Use   Smoking status: Never    Passive exposure: Past   Smokeless tobacco: Never  Vaping Use   Vaping status: Never Used  Substance Use Topics   Alcohol use: Yes    Comment: rarely-wine   Drug use: No    Current Outpatient Medications on File Prior to Visit  Medication Sig Dispense Refill   Cetirizine HCl (ZYRTEC PO) Take by mouth daily.     estradiol  (ESTRACE ) 2 MG tablet Take 2 mg by mouth daily.     ezetimibe  (ZETIA ) 10 MG tablet Take 1 tablet (10 mg total) by mouth daily. 30 tablet 11   Loratadine (CLARITIN PO) Take by mouth daily.     progesterone  (PROMETRIUM ) 100 MG capsule Take 100 mg by mouth at bedtime.     triamcinolone  cream (KENALOG ) 0.1 % Apply topically 2 (two) times daily. 30 g 2   zolpidem (AMBIEN) 10 MG tablet Take 10 mg by mouth at bedtime as needed.     cyclobenzaprine  (FLEXERIL ) 5 MG tablet Take 1 tablet (5 mg total) by mouth 3 (three) times daily as needed for muscle spasms. (Patient not taking: Reported on 02/21/2024) 60 tablet 0   meloxicam  (MOBIC ) 15 MG tablet Take 15 mg by mouth daily as needed. (Patient not taking: Reported on 02/21/2024)     ondansetron   (ZOFRAN ) 4 MG tablet Take 1 tablet (4 mg total) by mouth every 8 (eight) hours as needed for nausea or vomiting. (Patient not  taking: Reported on 02/21/2024) 30 tablet 1   No current facility-administered medications on file prior to visit.     Review of Systems  Constitutional: Negative.   HENT: Negative.    Eyes: Negative.   Respiratory: Negative.    Cardiovascular: Negative.   Gastrointestinal: Negative.   Endocrine: Negative.   Genitourinary: Negative.   Musculoskeletal: Negative.   Skin: Negative.   Neurological: Negative.   Psychiatric/Behavioral: Negative.          Objective:    BP 118/72 (BP Location: Left Arm, Patient Position: Sitting, Cuff Size: Normal)   Pulse 75   Temp (!) 97.5 F (36.4 C)   Ht 5' 4 (1.626 m)   Wt 172 lb 9.6 oz (78.3 kg)   SpO2 98%   BMI 29.63 kg/m   Wt Readings from Last 3 Encounters:  02/21/24 169 lb 14.4 oz (77.1 kg)  02/21/24 172 lb 9.6 oz (78.3 kg)  12/07/22 150 lb (68 kg)    BP Readings from Last 3 Encounters:  02/21/24 110/70  02/21/24 118/72  01/02/24 114/78    Physical Exam Vitals and nursing note reviewed.  Constitutional:      General: She is not in acute distress.    Appearance: Normal appearance.  HENT:     Head: Normocephalic and atraumatic.     Right Ear: Tympanic membrane, ear canal and external ear normal.     Left Ear: Tympanic membrane, ear canal and external ear normal.     Mouth/Throat:     Mouth: Mucous membranes are moist.     Pharynx: No posterior oropharyngeal erythema.  Eyes:     Conjunctiva/sclera: Conjunctivae normal.  Cardiovascular:     Rate and Rhythm: Normal rate and regular rhythm.     Pulses: Normal pulses.     Heart sounds: Normal heart sounds.  Pulmonary:     Effort: Pulmonary effort is normal.     Breath sounds: Normal breath sounds.  Abdominal:     Palpations: Abdomen is soft.     Tenderness: There is no abdominal tenderness.  Musculoskeletal:        General: Normal range of motion.      Cervical back: Normal range of motion and neck supple.     Right lower leg: No edema.     Left lower leg: No edema.  Lymphadenopathy:     Cervical: No cervical adenopathy.  Skin:    General: Skin is warm and dry.  Neurological:     General: No focal deficit present.     Mental Status: She is alert and oriented to person, place, and time.     Cranial Nerves: No cranial nerve deficit.     Coordination: Coordination normal.     Gait: Gait normal.  Psychiatric:        Mood and Affect: Mood normal.        Behavior: Behavior normal.        Thought Content: Thought content normal.        Judgment: Judgment normal.        Assessment & Plan:   Problem List Items Addressed This Visit       Cardiovascular and Mediastinum   Migraines - Primary   Monthly migraines are managed with Nurtec as needed. Continue using Nurtec 75mg  as needed.         Digestive   Hyperinsulinemia   Insulin  levels were elevated in the past. Check A1c today.       Relevant Orders  Hemoglobin A1c (Completed)     Musculoskeletal and Integument   Dermatitis   Contact dermatitis with ocular involvement is managed with minimal skincare and antihistamines. Continue using Aveeno oatmeal face wash and Gold Bond, and apply Vaseline regularly. Maintain Claritin in the morning and Zyrtec at night. Consider adding Pepcid (famotidine) once daily for allergic reactions. Attend the upcoming allergy and asthma appointment.        Other   Insomnia   Chronic, stable. Continue ambien 10mg  at bedtime as needed.       Vitamin D  deficiency   Check vitamin D  and treat based on results.        Relevant Orders   VITAMIN D  25 Hydroxy (Vit-D Deficiency, Fractures) (Completed)   Mixed hyperlipidemia   Chronic, stable. She is currently taking zetia  10mg  daily. Check CMP, CBC, lipid panel today and adjust regimen based on results.       Relevant Orders   CBC with Differential/Platelet (Completed)   Comprehensive  metabolic panel with GFR (Completed)   Lipid panel (Completed)   B12 deficiency   She has a history of low B12. Check B12 levels and treat based on results.       Relevant Orders   Vitamin B12 (Completed)   Overweight (BMI 25.0-29.9)   BMI 29. She experienced weight gain after discontinuing Mounjaro . Discussed appetite management options and chose Qsymia  due to prior success with phentermine . Explained potential side effects. Prescribe Qsymia , starting with a low dose for one month, then increase. Encourage physical activity for at least 150 minutes weekly. Recommend tracking foods and limiting snacking. Follow-up in 4-6  weeks.       Relevant Orders   TSH (Completed)     Follow up plan: Return in about 4 weeks (around 03/20/2024) for 4-6 weeks weight management .  Donavon Kimrey A Quanika Solem

## 2024-02-21 NOTE — Assessment & Plan Note (Signed)
 Contact dermatitis with ocular involvement is managed with minimal skincare and antihistamines. Continue using Aveeno oatmeal face wash and Gold Bond, and apply Vaseline regularly. Maintain Claritin in the morning and Zyrtec at night. Consider adding Pepcid (famotidine) once daily for allergic reactions. Attend the upcoming allergy and asthma appointment.

## 2024-02-21 NOTE — Patient Instructions (Addendum)
 Return for allergy skin testing first and then patch testing the following week. Will make additional recommendations based on results. Make sure you don't take any antihistamines for 3 days before the skin testing appointment. Don't put any lotion on the back and arms on the day of testing.  Must be in good health and not ill. No vaccines/injections/antibiotics within the past 7 days.  Plan on being here for 30-60 minutes.  Rash  Keep track of rashes and take pictures. Write down what you had done during flares.  See below for proper skin care. Use fragrance free and dye free products. No dryer sheets or fabric softener.    Avoid hair dyes and fingernails for now.  Patches are best placed on Monday with return to office on Wednesday and Friday of same week for readings.  Patches once placed should not get wet.  You do not have to stop any medications for patch testing but should not be on oral prednisone . You can schedule a patch testing visit when convenient for your schedule.     Follow up for skin testing.    Skin care recommendations  Bath time: Always use lukewarm water. AVOID very hot or cold water. Keep bathing time to 5-10 minutes. Do NOT use bubble bath. Use a mild soap and use just enough to wash the dirty areas. Do NOT scrub skin vigorously.  After bathing, pat dry your skin with a towel. Do NOT rub or scrub the skin.  Moisturizers and prescriptions:  ALWAYS apply moisturizers immediately after bathing (within 3 minutes). This helps to lock-in moisture. Use the moisturizer several times a day over the whole body. Good summer moisturizers include: Aveeno, CeraVe, Cetaphil. Good winter moisturizers include: Aquaphor, Vaseline, Cerave, Cetaphil, Eucerin, Vanicream. When using moisturizers along with medications, the moisturizer should be applied about one hour after applying the medication to prevent diluting effect of the medication or moisturize around where you applied  the medications. When not using medications, the moisturizer can be continued twice daily as maintenance.  Laundry and clothing: Avoid laundry products with added color or perfumes. Use unscented hypo-allergenic laundry products such as Tide free, Cheer free & gentle, and All free and clear.  If the skin still seems dry or sensitive, you can try double-rinsing the clothes. Avoid tight or scratchy clothing such as wool. Do not use fabric softeners or dyer sheets.

## 2024-02-22 ENCOUNTER — Other Ambulatory Visit (HOSPITAL_COMMUNITY): Payer: Self-pay

## 2024-02-22 ENCOUNTER — Encounter: Payer: Self-pay | Admitting: Allergy

## 2024-02-23 ENCOUNTER — Other Ambulatory Visit (HOSPITAL_COMMUNITY): Payer: Self-pay

## 2024-02-26 ENCOUNTER — Other Ambulatory Visit (HOSPITAL_COMMUNITY): Payer: Self-pay

## 2024-02-26 ENCOUNTER — Telehealth: Payer: Self-pay

## 2024-02-26 ENCOUNTER — Encounter: Payer: Self-pay | Admitting: Nurse Practitioner

## 2024-02-26 NOTE — Telephone Encounter (Signed)
 Pharmacy Patient Advocate Encounter   Received notification from Pt Calls Messages that prior authorization for Phentermine -Topiramate  ER 3.75-23MG  er capsules  is required/requested.   Insurance verification completed.   The patient is insured through [facility] .   Per test claim: PA required; PA submitted to above mentioned insurance via CoverMyMeds Key/confirmation #/EOC  (Key: [id number])   Status is pending

## 2024-02-26 NOTE — Telephone Encounter (Signed)
 Can we get a prior auth on Phentermine-Topiramate 3.75-23mg ?

## 2024-02-26 NOTE — Telephone Encounter (Signed)
 Noted pending prior auth.

## 2024-02-27 ENCOUNTER — Other Ambulatory Visit (HOSPITAL_COMMUNITY): Payer: Self-pay

## 2024-02-27 NOTE — Telephone Encounter (Signed)
 Message sent to patient via Mychart.

## 2024-02-27 NOTE — Telephone Encounter (Signed)
 Pharmacy Patient Advocate Encounter   Received notification from [facility] that Prior Authorization for Phentermine -Topiramate  ER 3.75-23MG  er capsules has been APPROVED from 7.15.25 to 7.29.25. Ran test claim, Copay is $162.46. This test claim was processed through [facility]- copay amounts may vary at other pharmacies due to pharmacy/plan contracts, or as the patient moves through the different stages of their insurance plan.    PA #/Case ID/Reference #:  (Key: [id number])       Please note that  phentermine -topiramate  ER 7.5-46mg  tablets,is also Approved. effective 03/09/2024 to 06/07/2024

## 2024-02-27 NOTE — Progress Notes (Unsigned)
 Skin testing note  RE: Carla Fuller MRN: [id number] DOB: 10/02/1965 Date of Office Visit: 02/28/2024  Referring provider: Nedra Tinnie LABOR, NP Primary care provider: Nedra Tinnie LABOR, NP  Chief Complaint: skin testing  History of Present Illness: I had the pleasure of seeing Carla Fuller for a skin testing visit at the [facility] on 02/28/2024. She is a 58 y.o. female, who is being followed for allergic conjunctivitis and contact dermatitis. Her previous allergy  office visit was on 02/21/2024 with Dr. Luke. Today is a skin testing visit.   Discussed the use of AI scribe software for clinical note transcription with the patient, who gave verbal consent to proceed.    She has persistent eye problems that have remained unchanged since her last visit. The symptoms are consistent and have not improved or worsened.  She has previously used steroid creams but has stopped using them for about a month. She currently uses Aquaphor and Vaseline for her skin care and is considering nonsteroidal creams. She also uses Cetaphil and Aveeno Oatmeal Gentle face wash.  She is eager to undergo patch testing to identify potential allergens, including chemicals from hair color.     Assessment and Plan: [name] is a 58 y.o. female with: Allergic conjunctivitis of both eyes Other allergic rhinitis Today's skin testing positive to grass, weed, trees, mold, dust mites. Start environmental control measures as below. Use over the counter antihistamines such as Zyrtec (cetirizine), Claritin (loratadine), Allegra (fexofenadine), or Xyzal (levocetirizine) daily as needed. May take twice a day during allergy  flares. May switch antihistamines every few months. Consider allergy  injections for long term control if above medications do not help the symptoms - handout given.   Allergic contact dermatitis due to other agents Past history - Chronic periorbital contact dermatitis with swelling,  erythema, desquamation, pruritus. Previous treatments provided temporary relief. Patient concerned about environmental and chemical triggers. Keep track of rashes and take pictures. Write down what you had done during flares.  Continue proper skin care. Use fragrance free and dye free products. No dryer sheets or fabric softener.   Avoid hair dyes and fingernail polish for now. Recommend patch testing next.  Avoid hair dyes and fingernail polish for now.  Return for Patch testing.  No orders of the defined types were placed in this encounter.  Lab Orders  No laboratory test(s) ordered today    Diagnostics: Skin Testing: Environmental allergy  panel. Today's skin testing positive to grass, weed, trees, mold, dust mites. Results discussed with patient/family.  Airborne Adult Perc - 02/28/24 0843     Time Antigen Placed 0843    Allergen Manufacturer Jestine    Location Back    Number of Test 55    1. Control-Buffer 50% Glycerol Negative    2. Control-Histamine 3+    3. Bahia Negative    4. French Southern Territories Negative    5. Johnson 2+    6. [place]  Blue Negative    7. Meadow Fescue Negative    8. Perennial Rye Negative    9. Timothy Negative    10. Ragweed Mix Negative    11. Cocklebur Negative    12. Plantain,  English Negative    13. Baccharis Negative    14. Dog Fennel Negative    15. Russian Thistle Negative    16. Lamb's Quarters Negative    17. Sheep Sorrell Negative    18. Rough Pigweed 2+    19. Marsh Elder, Rough Negative    20. Mugwort,  Common Negative    21. Box, Elder --   +/-   22. Cedar, red Negative    23. Sweet Gum Negative    24. Pecan Pollen Negative    25. Pine Mix Negative    26. Walnut, Black Pollen Negative    27. Red Mulberry Negative    28. Ash Mix Negative    29. Birch Mix Negative    30. Beech American Negative    31. Cottonwood, Guinea-Bissau Negative    32. Hickory, White Negative    33. Maple Mix Negative    34. Oak, [place] Mix Negative    35.  Sycamore Eastern 2+    36. Alternaria Alternata Negative    37. Cladosporium Herbarum Negative    38. Aspergillus Mix Negative    39. Penicillium Mix Negative    40. Bipolaris Sorokiniana (Helminthosporium) Negative    41. Drechslera Spicifera (Curvularia) Negative    42. Mucor Plumbeus Negative    43. Fusarium Moniliforme 2+    44. Aureobasidium Pullulans (pullulara) Negative    45. Rhizopus Oryzae Negative    46. Botrytis Cinera 2+    47. Epicoccum Nigrum Negative    48. Phoma Betae Negative    49. Dust Mite Mix 2+    50. Cat Hair 10,000 BAU/ml Negative    51.  Dog Epithelia Negative    52. Mixed Feathers Negative    53. Horse Epithelia Negative    54. Cockroach, German Negative    55. Tobacco Leaf Negative          Intradermal - 02/28/24 0911     Time Antigen Placed 0911    Allergen Manufacturer Other    Location Arm    Number of Test 11    Control Negative    Bahia Negative    French Southern Territories Negative    7 Grass Negative    Ragweed Mix Negative    Mold 1 Negative    Mold 2 Negative    Mold 3 Negative    Cat Negative    Dog Negative    Cockroach Negative          Previous notes and tests were reviewed. The plan was reviewed with the patient/family, and all questions/concerned were addressed.  It was my pleasure to see [name] today and participate in her care. Please feel free to contact me with any questions or concerns.  Sincerely,  Orlan Cramp, DO Allergy  & Immunology  [facility]  [place] office: [phone] [facility] office: [phone]

## 2024-02-28 ENCOUNTER — Encounter: Payer: Self-pay | Admitting: Allergy

## 2024-02-28 ENCOUNTER — Ambulatory Visit: Admitting: Allergy

## 2024-02-28 DIAGNOSIS — L2389 Allergic contact dermatitis due to other agents: Secondary | ICD-10-CM

## 2024-02-28 DIAGNOSIS — H1013 Acute atopic conjunctivitis, bilateral: Secondary | ICD-10-CM

## 2024-02-28 DIAGNOSIS — J3089 Other allergic rhinitis: Secondary | ICD-10-CM | POA: Diagnosis not present

## 2024-02-28 NOTE — Patient Instructions (Addendum)
 Today's skin testing positive to grass, weed, trees, mold, dust mites.  Results given.  Environmental allergies Start environmental control measures as below. Use over the counter antihistamines such as Zyrtec (cetirizine), Claritin (loratadine), Allegra (fexofenadine), or Xyzal (levocetirizine) daily as needed. May take twice a day during allergy  flares. May switch antihistamines every few months. Consider allergy  injections for long term control if above medications do not help the symptoms - handout given.   Rash  Keep track of rashes and take pictures. Write down what you had done during flares.  Continue proper skin care. Use fragrance free and dye free products. No dryer sheets or fabric softener.   Avoid hair dyes and fingernail polish for now. Recommend patch testing next.   Patches are best placed on Monday with return to office on Wednesday and Friday of same week for readings.  Patches once placed should not get wet.  You do not have to stop any medications for patch testing but should not be on oral prednisone . You can schedule a patch testing visit when convenient for your schedule.    Follow up for patch testing - NAC 80 panel.  Follow up in 2 months after patch testing.   Control of House Dust Mite Allergen Dust mite allergens are a common trigger of allergy  and asthma symptoms. While they can be found throughout the house, these microscopic creatures thrive in warm, humid environments such as bedding, upholstered furniture and carpeting. Because so much time is spent in the bedroom, it is essential to reduce mite levels there.  Encase pillows, mattresses, and box springs in special allergen-proof fabric covers or airtight, zippered plastic covers.  Bedding should be washed weekly in hot water (130 F) and dried in a hot dryer. Allergen-proof covers are available for comforters and pillows that can't be regularly washed.  Wash the allergy -proof covers every few months.  Minimize clutter in the bedroom. Keep pets out of the bedroom.  Keep humidity less than 50% by using a dehumidifier or air conditioning. You can buy a humidity measuring device called a hygrometer to monitor this.  If possible, replace carpets with hardwood, linoleum, or washable area rugs. If that's not possible, vacuum frequently with a vacuum that has a HEPA filter. Remove all upholstered furniture and non-washable window drapes from the bedroom. Remove all non-washable stuffed toys from the bedroom.  Wash stuffed toys weekly.  Reducing Pollen Exposure Pollen seasons: trees (spring), grass (summer) and ragweed/weeds (fall). Keep windows closed in your home and car to lower pollen exposure.  Install air conditioning in the bedroom and throughout the house if possible.  Avoid going out in dry windy days - especially early morning. Pollen counts are highest between 5 - 10 AM and on dry, hot and windy days.  Save outside activities for late afternoon or after a heavy rain, when pollen levels are lower.  Avoid mowing of grass if you have grass pollen allergy . Be aware that pollen can also be transported indoors on people and pets.  Dry your clothes in an automatic dryer rather than hanging them outside where they might collect pollen.  Rinse hair and eyes before bedtime.  Mold Control Mold and fungi can grow on a variety of surfaces provided certain temperature and moisture conditions exist.  Outdoor molds grow on plants, decaying vegetation and soil. The major outdoor mold, Alternaria and Cladosporium, are found in very high numbers during hot and dry conditions. Generally, a late summer - fall peak is seen for common  outdoor fungal spores. Rain will temporarily lower outdoor mold spore count, but counts rise rapidly when the rainy period ends. The most important indoor molds are Aspergillus and Penicillium. Dark, humid and poorly ventilated basements are ideal sites for mold growth. The next  most common sites of mold growth are the bathroom and the kitchen. Outdoor (Seasonal) Mold Control Use air conditioning and keep windows closed. Avoid exposure to decaying vegetation. Avoid leaf raking. Avoid grain handling. Consider wearing a face mask if working in moldy areas.  Indoor (Perennial) Mold Control  Maintain humidity below 50%. Get rid of mold growth on hard surfaces with water, detergent and, if necessary, 5% bleach (do not mix with other cleaners). Then dry the area completely. If mold covers an area more than 10 square feet, consider hiring an indoor environmental professional. For clothing, washing with soap and water is best. If moldy items cannot be cleaned and dried, throw them away. Remove sources e.g. contaminated carpets. Repair and seal leaking roofs or pipes. Using dehumidifiers in damp basements may be helpful, but empty the water and clean units regularly to prevent mildew from forming. All rooms, especially basements, bathrooms and kitchens, require ventilation and cleaning to deter mold and mildew growth. Avoid carpeting on concrete or damp floors, and storing items in damp areas.   Skin care recommendations  Bath time: Always use lukewarm water. AVOID very hot or cold water. Keep bathing time to 5-10 minutes. Do NOT use bubble bath. Use a mild soap and use just enough to wash the dirty areas. Do NOT scrub skin vigorously.  After bathing, pat dry your skin with a towel. Do NOT rub or scrub the skin.  Moisturizers and prescriptions:  ALWAYS apply moisturizers immediately after bathing (within 3 minutes). This helps to lock-in moisture. Use the moisturizer several times a day over the whole body. Good summer moisturizers include: Aveeno, CeraVe, Cetaphil. Good winter moisturizers include: Aquaphor, Vaseline, Cerave, Cetaphil, Eucerin, Vanicream. When using moisturizers along with medications, the moisturizer should be applied about one hour after applying the  medication to prevent diluting effect of the medication or moisturize around where you applied the medications. When not using medications, the moisturizer can be continued twice daily as maintenance.  Laundry and clothing: Avoid laundry products with added color or perfumes. Use unscented hypo-allergenic laundry products such as Tide free, Cheer free & gentle, and All free and clear.  If the skin still seems dry or sensitive, you can try double-rinsing the clothes. Avoid tight or scratchy clothing such as wool. Do not use fabric softeners or dyer sheets.

## 2024-03-01 ENCOUNTER — Other Ambulatory Visit (HOSPITAL_COMMUNITY): Payer: Self-pay

## 2024-03-01 ENCOUNTER — Other Ambulatory Visit (HOSPITAL_BASED_OUTPATIENT_CLINIC_OR_DEPARTMENT_OTHER): Payer: Self-pay

## 2024-03-01 ENCOUNTER — Other Ambulatory Visit: Payer: Self-pay | Admitting: Nurse Practitioner

## 2024-03-01 MED ORDER — PHENTERMINE-TOPIRAMATE ER 3.75-23 MG PO CP24
1.0000 | ORAL_CAPSULE | Freq: Every day | ORAL | 0 refills | Status: DC
  Filled 2024-03-01: qty 30, 30d supply, fill #0

## 2024-03-01 NOTE — Telephone Encounter (Signed)
 Patient is requesting for medication to be sent to [facility].

## 2024-03-04 ENCOUNTER — Other Ambulatory Visit (HOSPITAL_BASED_OUTPATIENT_CLINIC_OR_DEPARTMENT_OTHER): Payer: Self-pay

## 2024-03-05 ENCOUNTER — Other Ambulatory Visit (HOSPITAL_BASED_OUTPATIENT_CLINIC_OR_DEPARTMENT_OTHER): Payer: Self-pay

## 2024-03-08 ENCOUNTER — Other Ambulatory Visit (HOSPITAL_BASED_OUTPATIENT_CLINIC_OR_DEPARTMENT_OTHER): Payer: Self-pay

## 2024-03-19 ENCOUNTER — Encounter: Payer: Self-pay | Admitting: Nurse Practitioner

## 2024-03-19 ENCOUNTER — Encounter: Payer: Self-pay | Admitting: Allergy

## 2024-03-19 NOTE — Telephone Encounter (Signed)
 Please see message below. Last Rx was sent in on 03/01/2024 of Phentermine -Topiramate  ER 3.75-23 MG CP24. Patient request to be sent to [facility]. Thanks, BMS/CMA

## 2024-03-20 ENCOUNTER — Telehealth: Payer: Self-pay

## 2024-03-20 ENCOUNTER — Other Ambulatory Visit (HOSPITAL_COMMUNITY): Payer: Self-pay

## 2024-03-20 MED ORDER — PHENTERMINE-TOPIRAMATE ER 3.75-23 MG PO CP24: 1.0000 | ORAL_CAPSULE | Freq: Every day | ORAL | 0 refills | Status: DC

## 2024-03-20 NOTE — Telephone Encounter (Signed)
 Pharmacy Patient Advocate Encounter   Received notification from CoverMyMeds that prior authorization for Phentermine -Topiramate  ER 3.75-23MG  er capsules is required/requested.   Insurance verification completed.   The patient is insured through [facility] .   Per test claim: PA required; PA submitted to above mentioned insurance via CoverMyMeds Key/confirmation #/EOC [id number] Status is pending

## 2024-03-25 ENCOUNTER — Other Ambulatory Visit (HOSPITAL_COMMUNITY): Payer: Self-pay

## 2024-03-25 ENCOUNTER — Ambulatory Visit: Admitting: Nurse Practitioner

## 2024-03-25 NOTE — Telephone Encounter (Signed)
 Pharmacy Patient Advocate Encounter  Received notification from [facility] that Prior Authorization for Phentermine -Topiramate  ER 3.75-23MG  er capsules  has been APPROVED from 03/22/24 to 04/05/24. Ran test claim, Copay is $5. This test claim was processed through [facility]- copay amounts may vary at other pharmacies due to pharmacy/plan contracts, or as the patient moves through the different stages of their insurance plan.   PA #/Case ID/Reference #:  [id number]

## 2024-04-11 ENCOUNTER — Encounter: Payer: Self-pay | Admitting: Nurse Practitioner

## 2024-04-11 ENCOUNTER — Ambulatory Visit (INDEPENDENT_AMBULATORY_CARE_PROVIDER_SITE_OTHER): Admitting: Nurse Practitioner

## 2024-04-11 ENCOUNTER — Telehealth: Payer: Self-pay

## 2024-04-11 VITALS — BP 110/72 | HR 90 | Temp 97.8°F | Ht 64.0 in | Wt 166.2 lb

## 2024-04-11 DIAGNOSIS — E663 Overweight: Secondary | ICD-10-CM | POA: Diagnosis not present

## 2024-04-11 DIAGNOSIS — E538 Deficiency of other specified B group vitamins: Secondary | ICD-10-CM

## 2024-04-11 DIAGNOSIS — M8580 Other specified disorders of bone density and structure, unspecified site: Secondary | ICD-10-CM | POA: Diagnosis not present

## 2024-04-11 DIAGNOSIS — Z23 Encounter for immunization: Secondary | ICD-10-CM

## 2024-04-11 DIAGNOSIS — Z780 Asymptomatic menopausal state: Secondary | ICD-10-CM | POA: Diagnosis not present

## 2024-04-11 DIAGNOSIS — E782 Mixed hyperlipidemia: Secondary | ICD-10-CM | POA: Diagnosis not present

## 2024-04-11 DIAGNOSIS — G43009 Migraine without aura, not intractable, without status migrainosus: Secondary | ICD-10-CM

## 2024-04-11 DIAGNOSIS — Z0000 Encounter for general adult medical examination without abnormal findings: Secondary | ICD-10-CM | POA: Diagnosis not present

## 2024-04-11 MED ORDER — HYDROXYZINE PAMOATE 25 MG PO CAPS: 25.0000 mg | ORAL_CAPSULE | Freq: Three times a day (TID) | ORAL | 0 refills | Status: DC | PRN

## 2024-04-11 MED ORDER — EZETIMIBE 10 MG PO TABS: 10.0000 mg | ORAL_TABLET | Freq: Every day | ORAL | 11 refills | Status: AC

## 2024-04-11 MED ORDER — NURTEC 75 MG PO TBDP: 75.0000 mg | ORAL_TABLET | ORAL | 3 refills | Status: DC | PRN

## 2024-04-11 NOTE — Progress Notes (Unsigned)
 BP 110/72 (BP Location: Right Arm, Patient Position: Sitting, Cuff Size: Normal)   Pulse 90   Temp 97.8 F (36.6 C) (Temporal)   Ht 5' 4 (1.626 m)   Wt 166 lb 3.2 oz (75.4 kg)   LMP  (Exact Date)   SpO2 99%   BMI 28.53 kg/m    Subjective:    Patient ID: Carla Fuller, female    DOB: [birth year], 58 y.o.   MRN: [id number]  CC: Chief Complaint  Patient presents with   Annual Exam    Pt is fasting wanting to ask about anxiety want refill on estradiol  (ESTRACE ). Want to speak about migraine medication.    HPI: Carla Fuller is a 58 y.o. female presenting on 04/11/2024 for comprehensive medical examination. Current medical complaints include:{Blank single:19197::none,***}  She currently lives with: Menopausal Symptoms: {Blank single:19197::yes,no}  Depression and Anxiety Screen done today and results listed below:     02/21/2024    8:43 AM 09/08/2021    3:30 PM 08/27/2020    2:27 PM 07/04/2019   12:35 PM 06/21/2018   10:23 AM  Depression screen PHQ 2/9  Decreased Interest 1 0 1 0 0  Down, Depressed, Hopeless 0 0 1 1 1   PHQ - 2 Score 1 0 2 1 1   Altered sleeping 0  1    Tired, decreased energy 1  0    Change in appetite 3  0    Feeling bad or failure about yourself  0  0    Trouble concentrating 0  0    Moving slowly or fidgety/restless 0  0    Suicidal thoughts 0  0    PHQ-9 Score 5  3    Difficult doing work/chores Somewhat difficult  Not difficult at all        02/21/2024    8:43 AM  GAD 7 : Generalized Anxiety Score  Nervous, Anxious, on Edge 0  Control/stop worrying 0  Worry too much - different things 0  Trouble relaxing 0  Restless 0  Easily annoyed or irritable 1  Afraid - awful might happen 0  Total GAD 7 Score 1  Anxiety Difficulty Not difficult at all    The patient {has/does not have:19849} a history of falls. I {did/did not:19850} complete a risk assessment for falls. A plan of care for falls {was/was not:19852} documented.   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 Leonor   SHOULDER ARTHROSCOPY Right 05/30/2020   Dr. Dozier Greek repair, with labral tear repair   TOOTH EXTRACTION  Right 05/24/2018   #31 extraction    Medications:  Current Outpatient Medications on File Prior to Visit  Medication Sig   estradiol  (ESTRACE ) 2 MG tablet Take 2 mg by mouth daily.   ezetimibe  (ZETIA ) 10 MG tablet Take 1 tablet (10 mg total) by mouth daily.   Loratadine (CLARITIN PO) Take by mouth daily.   Phentermine -Topiramate  ER 3.75-23 MG CP24 Take 1 capsule by mouth daily at 6 (six) AM.   progesterone  (PROMETRIUM ) 100 MG capsule Take 100 mg by mouth at bedtime.   triamcinolone  cream (KENALOG ) 0.1 % Apply topically 2 (two) times daily. (Patient taking differently: Apply topically as needed.)   zolpidem (AMBIEN) 10 MG tablet Take 10 mg by mouth at bedtime as needed.   Cetirizine HCl (ZYRTEC PO) Take by mouth daily.   No current facility-administered medications on file prior to visit.    Allergies:  Allergies  Allergen Reactions   Percocet [Oxycodone-Acetaminophen ]     Social History:  Social History   Socioeconomic History   Marital status: Married     Spouse name: Not on file   Number of children: 0   Years of education: Not on file   Highest education level: Associate degree: occupational, Scientist, product/process development, or vocational program  Occupational History   Occupation: Insurance account manager: [employer]  Tobacco Use   Smoking status: Never    Passive exposure: Past   Smokeless tobacco: Never  Vaping Use   Vaping status: Never Used  Substance and Sexual Activity   Alcohol use: Yes    Comment: rarely-wine   Drug use: No   Sexual activity: Yes    Partners: Male    Birth control/protection: Post-menopausal  Other Topics Concern   Not on file  Social History Narrative   Not on file   Social Drivers of Health   Financial Resource Strain: Low Risk  (04/10/2024)   Overall Financial Resource Strain (CARDIA)    Difficulty of Paying Living Expenses: Not hard at all  Food Insecurity: No Food Insecurity (04/10/2024)   Hunger Vital Sign    Worried About Running Out of Food in the Last Year: Never true    Ran Out of Food in the Last Year: Never true  Transportation Needs: No Transportation Needs (04/10/2024)   PRAPARE - Administrator, Civil Service (Medical): No    Lack of Transportation (Non-Medical): No  Physical Activity: Insufficiently Active (04/10/2024)   Exercise Vital Sign    Days of Exercise per Week: 4 days    Minutes of Exercise per Session: 30 min  Stress: Stress Concern Present (04/10/2024)   [employer] of Occupational Health - Occupational Stress Questionnaire    Feeling of Stress: Rather much  Social Connections: Moderately Integrated (04/10/2024)   Social Connection and Isolation Panel    Frequency of Communication with Friends and Family: Three times a week    Frequency of Social Gatherings with Friends and Family: Twice a week    Attends Religious Services: More than 4 times per year    Active Member of [employer] or Organizations: No    Attends Engineer, structural: Not on file     Marital Status: Married  Catering manager Violence: Not on file   Social History   Tobacco Use  Smoking Status Never   Passive exposure: Past  Smokeless Tobacco Never   Social History   Substance and Sexual Activity  Alcohol Use Yes   Comment: rarely-wine    Family  History:  Family History  Problem Relation Age of Onset   Depression Mother    Heart disease Father    Tongue cancer Father        squamous cell carcinoma mets   Hypertension Father    Cancer Father        sacrum/lung mets   Diabetes Brother    Breast cancer Maternal Aunt    Breast cancer Paternal Aunt    Depression Maternal Grandmother    Breast cancer Maternal Grandmother    Heart disease Maternal Grandfather    Suicidality Paternal Grandmother    Heart disease Paternal Grandfather    Stroke Paternal Grandfather     Past medical history, surgical history, medications, allergies, family history and social history reviewed with patient today and changes made to appropriate areas of the chart.   ROS All other ROS negative except what is listed above and in the HPI.      Objective:    BP 110/72 (BP Location: Right Arm, Patient Position: Sitting, Cuff Size: Normal)   Pulse 90   Temp 97.8 F (36.6 C) (Temporal)   Ht 5' 4 (1.626 m)   Wt 166 lb 3.2 oz (75.4 kg)   LMP  (Exact Date)   SpO2 99%   BMI 28.53 kg/m   Wt Readings from Last 3 Encounters:  04/11/24 166 lb 3.2 oz (75.4 kg)  02/21/24 169 lb 14.4 oz (77.1 kg)  02/21/24 172 lb 9.6 oz (78.3 kg)    Physical Exam  Results for orders placed or performed in visit on 02/21/24  CBC with Differential/Platelet   Collection Time: 02/21/24  9:31 AM  Result Value Ref Range   WBC 6.4 4.0 - 10.5 K/uL   RBC 4.48 3.87 - 5.11 Mil/uL   Hemoglobin 13.6 12.0 - 15.0 g/dL   HCT 59.8 63.9 - 53.9 %   MCV 89.5 78.0 - 100.0 fl   MCHC 33.8 30.0 - 36.0 g/dL   RDW 86.9 88.4 - 84.4 %   Platelets 416.0 (H) 150.0 - 400.0 K/uL   Neutrophils Relative % 54.3 43.0  - 77.0 %   Lymphocytes Relative 36.4 12.0 - 46.0 %   Monocytes Relative 7.1 3.0 - 12.0 %   Eosinophils Relative 1.5 0.0 - 5.0 %   Basophils Relative 0.7 0.0 - 3.0 %   Neutro Abs 3.5 1.4 - 7.7 K/uL   Lymphs Abs 2.3 0.7 - 4.0 K/uL   Monocytes Absolute 0.5 0.1 - 1.0 K/uL   Eosinophils Absolute 0.1 0.0 - 0.7 K/uL   Basophils Absolute 0.0 0.0 - 0.1 K/uL  Comprehensive metabolic panel with GFR   Collection Time: 02/21/24  9:31 AM  Result Value Ref Range   Sodium 138 135 - 145 mEq/L   Potassium 3.8 3.5 - 5.1 mEq/L   Chloride 101 96 - 112 mEq/L   CO2 32 19 - 32 mEq/L   Glucose, Bld 89 70 - 99 mg/dL   BUN 16 6 - 23 mg/dL   Creatinine, Ser 9.12 0.40 - 1.20 mg/dL   Total Bilirubin 0.4 0.2 - 1.2 mg/dL   Alkaline Phosphatase 63 39 - 117 U/L   AST 15 0 - 37 U/L   ALT 13 0 - 35 U/L   Total Protein 6.8 6.0 - 8.3 g/dL   Albumin 4.3 3.5 - 5.2 g/dL   GFR 26.67 >39.99 mL/min   Calcium 9.2 8.4 - 10.5 mg/dL  Lipid panel   Collection Time: 02/21/24  9:31 AM  Result Value Ref  Range   Cholesterol 208 (H) 0 - 200 mg/dL   Triglycerides 772.9 (H) 0.0 - 149.0 mg/dL   HDL 39.29 >60.99 mg/dL   VLDL 54.5 (H) 0.0 - 59.9 mg/dL   LDL Cholesterol 897 (H) 0 - 99 mg/dL   Total CHOL/HDL Ratio 3    NonHDL 147.04   VITAMIN D  25 Hydroxy (Vit-D Deficiency, Fractures)   Collection Time: 02/21/24  9:31 AM  Result Value Ref Range   VITD 43.74 30.00 - 100.00 ng/mL  Vitamin B12   Collection Time: 02/21/24  9:31 AM  Result Value Ref Range   Vitamin B-12 293 211 - 911 pg/mL  TSH   Collection Time: 02/21/24  9:31 AM  Result Value Ref Range   TSH 3.56 0.35 - 5.50 uIU/mL  Hemoglobin A1c   Collection Time: 02/21/24  9:31 AM  Result Value Ref Range   Hgb A1c MFr Bld 5.3 4.6 - 6.5 %      Assessment & Plan:   Problem List Items Addressed This Visit   None    Follow up plan: No follow-ups on file.   LABORATORY TESTING:  - Pap smear: {Blank single:19197::pap done,not applicable,up to date,done  elsewhere}  IMMUNIZATIONS:   - Tdap: Tetanus vaccination status reviewed: {tetanus status:315746}. - Influenza: {Blank single:19197::Up to date,Administered today,Postponed to flu season,Declined,Given elsewhere} - Pneumovax: {Blank single:19197::Up to date,Administered today,Not applicable,Declined,Given elsewhere} - Prevnar: {Blank single:19197::Up to date,Administered today,Not applicable,Declined,Given elsewhere} - HPV: {Blank single:19197::Up to date,Administered today,Not applicable,Declined,Given elsewhere} - Shingrix vaccine: {Blank single:19197::Up to date,Administered today,Not applicable,Declined,Given elsewhere}  SCREENING: -Mammogram: {Blank single:19197::Up to date,Ordered today,Not applicable,Declined,Done elsewhere}  - Colonoscopy: {Blank single:19197::Up to date,Ordered today,Not applicable,Declined,Done elsewhere}  - Bone Density: {Blank single:19197::Up to date,Ordered today,Not applicable,Declined,Done elsewhere}   PATIENT COUNSELING:   Advised to take 1 mg of folate supplement per day if capable of pregnancy.   Sexuality: Discussed sexually transmitted diseases, partner selection, use of condoms, avoidance of unintended pregnancy  and contraceptive alternatives.   Advised to avoid cigarette smoking.  I discussed with the patient that most people either abstain from alcohol or drink within safe limits (<=14/week and <=4 drinks/occasion for males, <=7/weeks and <= 3 drinks/occasion for females) and that the risk for alcohol disorders and other health effects rises proportionally with the number of drinks per week and how often a drinker exceeds daily limits.  Discussed cessation/primary prevention of drug use and availability of treatment for abuse.   Diet: Encouraged to adjust caloric intake to maintain  or achieve ideal body weight, to reduce intake of dietary saturated fat and total fat,  to limit sodium intake by avoiding high sodium foods and not adding table salt, and to maintain adequate dietary potassium and calcium preferably from fresh fruits, vegetables, and low-fat dairy products.    stressed the importance of regular exercise  Injury prevention: Discussed safety belts, safety helmets, smoke detector, smoking near bedding or upholstery.   Dental health: Discussed importance of regular tooth brushing, flossing, and dental visits.    NEXT PREVENTATIVE PHYSICAL DUE IN 1 YEAR. No follow-ups on file.  Notnamed Croucher A Euriah Matlack

## 2024-04-11 NOTE — Telephone Encounter (Signed)
 Left message for patient to return call.  Patient was ordered a Tdap today and would like to know if patient wanted to come back today or schedule a nurse visit to have this done.

## 2024-04-11 NOTE — Patient Instructions (Addendum)
 It was great to see you!  Start hydroxyzine  3 time a day as needed for sleep or anxiety - this may make you sleepy   Stop qsymia  for 2 weeks and see if your anxiety improves  You can try excedrin as needed for migraine   Let's follow-up in 2 months, sooner if you have concerns.  If a referral was placed today, you will be contacted for an appointment. Please note that routine referrals can sometimes take up to 3-4 weeks to process. Please call our office if you haven't heard anything after this time frame.  Take care,  Tinnie Harada, NP

## 2024-04-12 ENCOUNTER — Other Ambulatory Visit (HOSPITAL_COMMUNITY): Payer: Self-pay

## 2024-04-12 ENCOUNTER — Telehealth: Payer: Self-pay

## 2024-04-12 NOTE — Assessment & Plan Note (Signed)
 BMI 28.5. She has lost 6 pounds from her last visit, but has been experiencing crying spells, irritability, and anxiety. Will have her stop the qsymia  to see if this is side effects she is experiencing. Start hydroxyzine  25mg  TID prn - cautioned this may make her sleepy. Follow-up in 4 weeks.

## 2024-04-12 NOTE — Telephone Encounter (Signed)
 Can we get a prior auth of Nurtec 75mg  ODT Tablets?

## 2024-04-12 NOTE — Assessment & Plan Note (Signed)
 Chronic, stable. Monthly migraines are managed with Nurtec as needed. Continue using Nurtec 75mg  as needed.

## 2024-04-12 NOTE — Assessment & Plan Note (Signed)
 Chronic, stable

## 2024-04-12 NOTE — Telephone Encounter (Signed)
 Pharmacy Patient Advocate Encounter   Received notification from Pt Calls Messages that prior authorization for Nurtec 75MG  dispersible tablets  is required/requested.   Insurance verification completed.   The patient is insured through [facility] .   Per test claim: PA required; PA submitted to above mentioned insurance via Latent Key/confirmation #/EOC [id number]   Status is pending

## 2024-04-12 NOTE — Assessment & Plan Note (Addendum)
 Chronic, stable. She is currently taking zetia  10mg  daily. Continue current regimen.

## 2024-04-12 NOTE — Telephone Encounter (Signed)
 Noted. Pending Prior Auth

## 2024-04-12 NOTE — Assessment & Plan Note (Signed)
 05/07/21 dexa showed osteopenia with T score of -1.6. Will repeat dexa scan today.

## 2024-04-17 ENCOUNTER — Other Ambulatory Visit (HOSPITAL_COMMUNITY): Payer: Self-pay

## 2024-04-17 MED ORDER — NURTEC 75 MG PO TBDP: 75.0000 mg | ORAL_TABLET | ORAL | 3 refills | Status: AC | PRN

## 2024-04-17 NOTE — Telephone Encounter (Signed)
 Noted

## 2024-04-17 NOTE — Telephone Encounter (Signed)
 Approval document In media.   P/a is effective until 2.25.26 for up to 18 tabs per 30days. I have ran a test claim and the cost is 0.00

## 2024-04-17 NOTE — Telephone Encounter (Signed)
 Pharmacy has been contacted to fill Rx. I was informed the medication has to be ordered, so the pt can p/u the Rx tomorrow. Nothing further needed.

## 2024-04-17 NOTE — Telephone Encounter (Signed)
 Noted PA still pending on 04/17/24 BMS

## 2024-04-17 NOTE — Addendum Note (Signed)
 Addended by: Karsyn Jamie A on: 04/17/2024 03:32 PM   Modules accepted: Orders

## 2024-04-22 ENCOUNTER — Encounter: Payer: Self-pay | Admitting: Internal Medicine

## 2024-05-22 ENCOUNTER — Ambulatory Visit (AMBULATORY_SURGERY_CENTER)

## 2024-05-22 VITALS — Ht 64.0 in | Wt 160.0 lb

## 2024-05-22 DIAGNOSIS — Z860100 Personal history of colon polyps, unspecified: Secondary | ICD-10-CM

## 2024-05-22 MED ORDER — NA SULFATE-K SULFATE-MG SULF 17.5-3.13-1.6 GM/177ML PO SOLN: 1.0000 | Freq: Once | ORAL | 0 refills | Status: AC

## 2024-05-22 NOTE — Progress Notes (Signed)
 No issues known to pt with past sedation with any surgeries or procedures Patient denies ever being told they had issues or difficulty with intubation  No FH of Malignant Hyperthermia Pt is not on diet pills Pt is not on home 02  Pt is not on blood thinners  Pt states issues with constipation; usually has bm q3days No A fib or A flutter Have any cardiac testing pending--no Pt can ambulate independently Pt denies use of chewing tobacco Discussed diabetic I weight loss medication holds Discussed NSAID holds Checked BMI Pt instructed to use Singlecare.com or GoodRx for a price reduction on prep  Patient's chart reviewed by Norleen Schillings CNRA prior to previsit and patient appropriate for the [facility].  Pre visit completed and red dot placed by patient's name on their procedure day (on provider's schedule).

## 2024-05-29 ENCOUNTER — Encounter: Payer: Self-pay | Admitting: Internal Medicine

## 2024-06-06 ENCOUNTER — Telehealth: Payer: Self-pay | Admitting: Nurse Practitioner

## 2024-06-06 ENCOUNTER — Ambulatory Visit (AMBULATORY_SURGERY_CENTER): Admitting: Internal Medicine

## 2024-06-06 ENCOUNTER — Encounter: Payer: Self-pay | Admitting: Internal Medicine

## 2024-06-06 VITALS — BP 120/71 | HR 79 | Temp 98.1°F | Resp 11 | Ht 64.0 in | Wt 160.0 lb

## 2024-06-06 DIAGNOSIS — K648 Other hemorrhoids: Secondary | ICD-10-CM | POA: Diagnosis not present

## 2024-06-06 DIAGNOSIS — Z1211 Encounter for screening for malignant neoplasm of colon: Secondary | ICD-10-CM | POA: Diagnosis not present

## 2024-06-06 DIAGNOSIS — K635 Polyp of colon: Secondary | ICD-10-CM | POA: Diagnosis not present

## 2024-06-06 DIAGNOSIS — D120 Benign neoplasm of cecum: Secondary | ICD-10-CM

## 2024-06-06 DIAGNOSIS — F909 Attention-deficit hyperactivity disorder, unspecified type: Secondary | ICD-10-CM | POA: Diagnosis not present

## 2024-06-06 DIAGNOSIS — F419 Anxiety disorder, unspecified: Secondary | ICD-10-CM | POA: Diagnosis not present

## 2024-06-06 MED ORDER — SODIUM CHLORIDE 0.9 % IV SOLN: 500.0000 mL | INTRAVENOUS | Status: DC

## 2024-06-06 NOTE — Progress Notes (Signed)
 Report given to PACU, vss

## 2024-06-06 NOTE — Telephone Encounter (Signed)
 LVM for pt to call back and schedule bone density scan at [facility]. Also sent mychart msg. Transfer to [facility].

## 2024-06-06 NOTE — Progress Notes (Signed)
 GASTROENTEROLOGY PROCEDURE H&P NOTE   Primary Care Physician: Nedra Tinnie LABOR, NP    Reason for Procedure:   Colon cancer screening  Plan:    colonoscopy  Patient is appropriate for endoscopic procedure(s) in the ambulatory ([facility]) setting.  The nature of the procedure, as well as the risks, benefits, and alternatives were carefully and thoroughly reviewed with the patient. Ample time for discussion and questions allowed. The patient understood, was satisfied, and agreed to proceed.     HPI: Carla Fuller is a 58 y.o. female who presents for colonoscopy.  Medical history as below.  Tolerated the prep.  No recent chest pain or shortness of breath.  No abdominal pain today.  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 Leonor   SHOULDER ARTHROSCOPY Right 05/30/2020   Dr. Dozier Greek repair, with labral tear repair   TOOTH EXTRACTION Right 05/24/2018   #31 extraction    Prior to Admission medications   Medication Sig Start Date End Date Taking? Authorizing Provider  estradiol  (ESTRACE ) 2 MG tablet Take 2 mg by mouth daily.   Yes [provider]  ezetimibe  (ZETIA ) 10 MG tablet Take 1 tablet (10 mg total) by mouth daily. 04/11/24  Yes  McElwee, Lauren A, NP  Loratadine (CLARITIN PO) Take by mouth daily.   Yes [provider]  progesterone  (PROMETRIUM ) 100 MG capsule Take 100 mg by mouth at bedtime.   Yes [provider]  zolpidem (AMBIEN) 10 MG tablet Take 10 mg by mouth at bedtime as needed.   Yes [provider]  Rimegepant Sulfate (NURTEC) 75 MG TBDP Take 1 tablet (75 mg total) by mouth every other day as needed. 04/17/24   McElwee, Lauren A, NP  triamcinolone  cream (KENALOG ) 0.1 % Apply topically 2 (two) times daily. 07/31/23   Wilkinson, Dana E, NP    Current Outpatient Medications  Medication Sig Dispense Refill   estradiol  (ESTRACE ) 2 MG tablet Take 2 mg by mouth daily.     ezetimibe  (ZETIA ) 10 MG tablet Take 1 tablet (10 mg total) by mouth daily. 30 tablet 11   Loratadine (CLARITIN PO) Take by mouth daily.     progesterone  (PROMETRIUM ) 100 MG capsule Take 100 mg by mouth at bedtime.     zolpidem (AMBIEN) 10 MG tablet Take 10 mg by mouth at bedtime as needed.     Rimegepant Sulfate (NURTEC) 75 MG TBDP Take 1 tablet (75 mg total) by mouth every other day as needed. 18 tablet 3   triamcinolone  cream (KENALOG ) 0.1 % Apply topically 2 (two) times daily. 30 g  2   Current Facility-Administered Medications  Medication Dose Route Frequency Provider Last Rate Last Admin   0.9 %  sodium chloride  infusion  500 mL Intravenous Continuous Delanda Bulluck, Gordy HERO, MD        Allergies as of 06/06/2024 - Review Complete 06/06/2024  Allergen Reaction Noted   Percocet [oxycodone-acetaminophen ] Nausea And Vomiting 03/13/2013    Family History  Problem Relation Age of Onset   Depression Mother    Heart disease Father    Tongue cancer Father        squamous cell carcinoma mets   Hypertension Father    Cancer Father        sacrum/lung mets   Diabetes Brother    Breast cancer Maternal Aunt    Breast cancer Paternal Aunt    Depression Maternal Grandmother    Breast cancer Maternal Grandmother    Heart disease  Maternal Grandfather    Suicidality Paternal Grandmother    Heart disease Paternal Grandfather    Stroke Paternal Grandfather    Stomach cancer Neg Hx    Rectal cancer Neg Hx    Esophageal cancer Neg Hx    Colon polyps Neg Hx    Colon cancer Neg Hx     Social History   Socioeconomic History   Marital status: Married    Spouse name: Not on file   Number of children: 0   Years of education: Not on file   Highest education level: Associate degree: occupational, Scientist, product/process development, or vocational program  Occupational History   Occupation: Insurance account manager: [employer]  Tobacco Use   Smoking status: Never    Passive exposure: Past   Smokeless tobacco: Never  Vaping Use   Vaping status: Never Used  Substance and Sexual Activity   Alcohol use: Yes    Comment: rarely-wine   Drug use: No   Sexual activity: Yes    Partners: Male    Birth control/protection: Post-menopausal  Other Topics Concern   Not on file  Social History Narrative   Not on file   Social Drivers of Health   Financial Resource Strain: Low Risk  (04/10/2024)   Overall Financial Resource Strain (CARDIA)    Difficulty of Paying Living Expenses: Not hard at all  Food Insecurity: No Food Insecurity (04/10/2024)   Hunger Vital Sign    Worried About Running Out of Food in the Last Year: Never true    Ran Out of Food in the Last Year: Never true  Transportation Needs: No Transportation Needs (04/10/2024)   PRAPARE - Administrator, Civil Service (Medical): No    Lack of Transportation (Non-Medical): No  Physical Activity: Insufficiently Active (04/10/2024)   Exercise Vital Sign    Days of Exercise per Week: 4 days    Minutes of Exercise per Session: 30 min  Stress: Stress Concern Present (04/10/2024)   [employer] of Occupational Health - Occupational Stress Questionnaire    Feeling of Stress: Rather much  Social Connections: Moderately Integrated (04/10/2024)   Social  Connection and Isolation Panel    Frequency of Communication with Friends and Family: Three times a week    Frequency of Social Gatherings with Friends and Family: Twice a week    Attends Religious Services: More than 4 times per year    Active Member of [employer] or Organizations: No    Attends Engineer, structural: Not on file    Marital Status: Married  Catering manager Violence: Not on  file    Physical Exam: Vital signs in last 24 hours: @BP  123/75   Pulse 85   Temp 98.1 F (36.7 C) (Temporal)   Resp 10   Ht 5' 4 (1.626 m)   Wt 160 lb (72.6 kg)   SpO2 100%   BMI 27.46 kg/m  GEN: NAD EYE: Sclerae anicteric ENT: MMM CV: Non-tachycardic Pulm: CTA b/l GI: Soft, NT/ND NEURO:  Alert & Oriented x 3   Gordy Starch, MD [facility]  06/06/2024 8:04 AM

## 2024-06-06 NOTE — Progress Notes (Signed)
 Called to room to assist during endoscopic procedure.  Patient ID and intended procedure confirmed with present staff. Received instructions for my participation in the procedure from the performing physician.

## 2024-06-06 NOTE — Patient Instructions (Signed)
 Please read handouts provided. Resume previous diet. Await pathology results. Continue present medications.   YOU HAD AN ENDOSCOPIC PROCEDURE TODAY AT THE [facility]:   Refer to the procedure report that was given to you for any specific questions about what was found during the examination.  If the procedure report does not answer your questions, please call your gastroenterologist to clarify.  If you requested that your care partner not be given the details of your procedure findings, then the procedure report has been included in a sealed envelope for you to review at your convenience later.  YOU SHOULD EXPECT: Some feelings of bloating in the abdomen. Passage of more gas than usual.  Walking can help get rid of the air that was put into your GI tract during the procedure and reduce the bloating. If you had a lower endoscopy (such as a colonoscopy or flexible sigmoidoscopy) you may notice spotting of blood in your stool or on the toilet paper. If you underwent a bowel prep for your procedure, you may not have a normal bowel movement for a few days.  Please Note:  You might notice some irritation and congestion in your nose or some drainage.  This is from the oxygen used during your procedure.  There is no need for concern and it should clear up in a day or so.  SYMPTOMS TO REPORT IMMEDIATELY:  Following lower endoscopy (colonoscopy or flexible sigmoidoscopy):  Excessive amounts of blood in the stool  Significant tenderness or worsening of abdominal pains  Swelling of the abdomen that is new, acute  Fever of 100F or higher.  For urgent or emergent issues, a gastroenterologist can be reached at any hour by calling [phone]. Do not use MyChart messaging for urgent concerns.    DIET:  We do recommend a small meal at first, but then you may proceed to your regular diet.  Drink plenty of fluids but you should avoid alcoholic beverages for 24 hours.  ACTIVITY:  You should  plan to take it easy for the rest of today and you should NOT DRIVE or use heavy machinery until tomorrow (because of the sedation medicines used during the test).    FOLLOW UP: Our staff will call the number listed on your records the next business day following your procedure.  We will call around 7:15- 8:00 am to check on you and address any questions or concerns that you may have regarding the information given to you following your procedure. If we do not reach you, we will leave a message.     If any biopsies were taken you will be contacted by phone or by letter within the next 1-3 weeks.  Please call us  at [phone] if you have not heard about the biopsies in 3 weeks.    SIGNATURES/CONFIDENTIALITY: You and/or your care partner have signed paperwork which will be entered into your electronic medical record.  These signatures attest to the fact that that the information above on your After Visit Summary has been reviewed and is understood.  Full responsibility of the confidentiality of this discharge information lies with you and/or your care-partner.

## 2024-06-06 NOTE — Op Note (Signed)
 [facility] Patient Name: Carla Fuller Procedure Date: 06/06/2024 8:02 AM MRN: [id number] Endoscopist: Gordy CHRISTELLA Starch , MD, [id number] Age: 58 Referring MD:  Date of Birth: 1966-04-25 Gender: Female Account #: [id number] Procedure:                Colonoscopy Indications:              Screening for colorectal malignant neoplasm, Last                            colonoscopy: 2014 Medicines:                Monitored Anesthesia Care Procedure:                Pre-Anesthesia Assessment:                           - Prior to the procedure, a History and Physical                            was performed, and patient medications and                            allergies were reviewed. The patient's tolerance of                            previous anesthesia was also reviewed. The risks                            and benefits of the procedure and the sedation                            options and risks were discussed with the patient.                            All questions were answered, and informed consent                            was obtained. Prior Anticoagulants: The patient has                            taken no anticoagulant or antiplatelet agents. ASA                            Grade Assessment: II - A patient with mild systemic                            disease. After reviewing the risks and benefits,                            the patient was deemed in satisfactory condition to                            undergo the procedure.  After obtaining informed consent, the colonoscope                            was passed under direct vision. Throughout the                            procedure, the patient's blood pressure, pulse, and                            oxygen saturations were monitored continuously. The                            Olympus Scope SN [id number] was introduced through the                            anus and advanced to the cecum,  identified by                            appendiceal orifice and ileocecal valve. The                            colonoscopy was performed without difficulty. The                            patient tolerated the procedure well. The quality                            of the bowel preparation was good. The ileocecal                            valve, appendiceal orifice, and rectum were                            photographed. Scope In: 8:09:00 AM Scope Out: 8:21:22 AM Scope Withdrawal Time: 0 hours 9 minutes 56 seconds  Total Procedure Duration: 0 hours 12 minutes 22 seconds  Findings:                 The digital rectal exam was normal.                           A 5 mm polyp was found in the cecum. The polyp was                            sessile. The polyp was removed with a cold snare.                            Resection and retrieval were complete.                           Internal hemorrhoids were found during                            retroflexion. The hemorrhoids were small.  The exam was otherwise without abnormality. Complications:            No immediate complications. Estimated Blood Loss:     Estimated blood loss: none. Impression:               - One 5 mm polyp in the cecum, removed with a cold                            snare. Resected and retrieved.                           - Small internal hemorrhoids.                           - The examination was otherwise normal. Recommendation:           - Patient has a contact number available for                            emergencies. The signs and symptoms of potential                            delayed complications were discussed with the                            patient. Return to normal activities tomorrow.                            Written discharge instructions were provided to the                            patient.                           - Resume previous diet.                           -  Continue present medications.                           - Await pathology results.                           - Repeat colonoscopy is recommended. The                            colonoscopy date will be determined after pathology                            results from today's exam become available for                            review. Gordy CHRISTELLA Starch, MD 06/06/2024 8:29:04 AM This report has been signed electronically.

## 2024-06-07 ENCOUNTER — Ambulatory Visit
Admission: RE | Admit: 2024-06-07 | Discharge: 2024-06-07 | Disposition: A | Discharge status: Home / Self Care | Source: Ambulatory Visit | Attending: Obstetrics and Gynecology | Admitting: Obstetrics and Gynecology

## 2024-06-07 ENCOUNTER — Telehealth: Payer: Self-pay | Admitting: *Deleted

## 2024-06-07 DIAGNOSIS — Z1231 Encounter for screening mammogram for malignant neoplasm of breast: Secondary | ICD-10-CM | POA: Diagnosis not present

## 2024-06-07 NOTE — Telephone Encounter (Signed)
  Follow up Call-     06/06/2024    7:33 AM  Call back number  Post procedure Call Back phone  # [phone]  Permission to leave phone message Yes     Patient questions:   Message left to call us  if necessary.

## 2024-06-10 ENCOUNTER — Ambulatory Visit: Payer: Self-pay | Admitting: Internal Medicine

## 2024-06-10 LAB — SURGICAL PATHOLOGY

## 2024-06-11 ENCOUNTER — Ambulatory Visit

## 2024-06-11 ENCOUNTER — Ambulatory Visit (INDEPENDENT_AMBULATORY_CARE_PROVIDER_SITE_OTHER): Admitting: Nurse Practitioner

## 2024-06-11 ENCOUNTER — Encounter: Payer: Self-pay | Admitting: Nurse Practitioner

## 2024-06-11 ENCOUNTER — Telehealth: Payer: Self-pay

## 2024-06-11 VITALS — BP 128/74 | Temp 97.3°F | Ht 64.0 in | Wt 174.6 lb

## 2024-06-11 DIAGNOSIS — E663 Overweight: Secondary | ICD-10-CM | POA: Diagnosis not present

## 2024-06-11 DIAGNOSIS — M25552 Pain in left hip: Secondary | ICD-10-CM | POA: Diagnosis not present

## 2024-06-11 DIAGNOSIS — G8929 Other chronic pain: Secondary | ICD-10-CM

## 2024-06-11 DIAGNOSIS — M542 Cervicalgia: Secondary | ICD-10-CM

## 2024-06-11 DIAGNOSIS — M5442 Lumbago with sciatica, left side: Secondary | ICD-10-CM

## 2024-06-11 DIAGNOSIS — F419 Anxiety disorder, unspecified: Secondary | ICD-10-CM | POA: Diagnosis not present

## 2024-06-11 MED ORDER — GABAPENTIN 300 MG PO CAPS: 300.0000 mg | ORAL_CAPSULE | Freq: Two times a day (BID) | ORAL | 1 refills | Status: AC

## 2024-06-11 MED ORDER — AZELASTINE-FLUTICASONE 137-50 MCG/ACT NA SUSP: NASAL | 3 refills | Status: AC

## 2024-06-11 MED ORDER — DULOXETINE HCL 30 MG PO CPEP: 30.0000 mg | ORAL_CAPSULE | Freq: Every day | ORAL | 1 refills | Status: DC

## 2024-06-11 MED ORDER — LIDOCAINE 5 % EX PTCH: 1.0000 | MEDICATED_PATCH | CUTANEOUS | 0 refills | Status: AC

## 2024-06-11 MED ORDER — CONTRAVE 8-90 MG PO TB12: ORAL_TABLET | ORAL | 0 refills | Status: DC

## 2024-06-11 MED ORDER — CYCLOBENZAPRINE HCL 10 MG PO TABS: 10.0000 mg | ORAL_TABLET | Freq: Three times a day (TID) | ORAL | 1 refills | Status: DC | PRN

## 2024-06-11 MED ORDER — MELOXICAM 15 MG PO TABS: 15.0000 mg | ORAL_TABLET | Freq: Every day | ORAL | 1 refills | Status: AC

## 2024-06-11 NOTE — Telephone Encounter (Signed)
 Can we get a prior auth on Azelastine -Fluticasone  (Dymista )?

## 2024-06-11 NOTE — Progress Notes (Signed)
 Established Patient Office Visit  Subjective   Patient ID: Carla Fuller, female    DOB: [birth year]  Age: 58 y.o. MRN: [id number]  Chief Complaint  Patient presents with   Weight Managment    Follow up, discuss Hydroxyzine  Pamoate, Qsymia , request azelastine , concerns with lower back pain    HPI Discussed the use of AI scribe software for clinical note transcription with the patient, who gave verbal consent to proceed.  History of Present Illness   Carla Fuller is a 58 year old female who presents with concerns about weight management and back pain.  She has tried several medications for weight management, including Qsymia , Mounjaro , and Wegovy . Qsymia  caused increased heart rate and emotional changes. She lost 35-40 pounds on Mounjaro  but discontinued it due to insurance issues due to not having diabetes. Currently, she is not exercising or dieting due to work-related exhaustion. She is considering Contrave and is open to trying phentermine  again.  She experiences chronic back pain, severe and radiating from her hip to her groin and down her leg. She has low back pain and sciatica, with the current severity being new. Meloxicam , gabapentin , and occasional cyclobenzaprine  provide relief. She uses a heating pad and stretching exercises to alleviate symptoms. She recalls a past evaluation identifying a cyst in the coccyx and is interested in updated imaging.  She reports neck pain with a significant knot on one side, attributed to job stress. She has not taken anxiety medication other than hydroxyzine  but is open to other options. She experiences numbness and tingling in her hands and fingers, and leg weakness, which improves with movement.        06/11/2024    8:45 AM 04/11/2024    2:06 PM 02/21/2024    8:43 AM 09/08/2021    3:30 PM 08/27/2020    2:27 PM  Depression screen PHQ 2/9  Decreased Interest 1 1 1  0 1  Down, Depressed, Hopeless 2 1 0 0 1  PHQ - 2 Score 3 2 1  0 2  Altered  sleeping 1 1 0  1  Tired, decreased energy 3 1 1   0  Change in appetite 3 1 3   0  Feeling bad or failure about yourself  1 0 0  0  Trouble concentrating 3 1 0  0  Moving slowly or fidgety/restless 0 0 0  0  Suicidal thoughts 0 0 0  0  PHQ-9 Score 14 6 5  3   Difficult doing work/chores Very difficult Somewhat difficult Somewhat difficult  Not difficult at all      06/11/2024    8:45 AM 04/11/2024    2:07 PM 02/21/2024    8:43 AM  GAD 7 : Generalized Anxiety Score  Nervous, Anxious, on Edge 3 3 0  Control/stop worrying 2 3 0  Worry too much - different things 3 2 0  Trouble relaxing 2 3 0  Restless 0 0 0  Easily annoyed or irritable 3 3 1   Afraid - awful might happen 0 0 0  Total GAD 7 Score 13 14 1   Anxiety Difficulty Very difficult Somewhat difficult Not difficult at all     ROS See pertinent positives and negatives per HPI.    Objective:     BP 128/74 (BP Location: Left Arm, Patient Position: Sitting, Cuff Size: Normal)   Temp (!) 97.3 F (36.3 C)   Ht 5' 4 (1.626 m)   Wt 174 lb 9.6 oz (79.2 kg)   SpO2 98%   BMI  29.97 kg/m  BP Readings from Last 3 Encounters:  06/11/24 128/74  06/06/24 120/71  04/11/24 110/72   Wt Readings from Last 3 Encounters:  06/11/24 174 lb 9.6 oz (79.2 kg)  06/06/24 160 lb (72.6 kg)  05/22/24 160 lb (72.6 kg)      Physical Exam Vitals and nursing note reviewed.  Constitutional:      General: She is not in acute distress.    Appearance: Normal appearance.  HENT:     Head: Normocephalic.  Eyes:     Conjunctiva/sclera: Conjunctivae normal.  Cardiovascular:     Rate and Rhythm: Normal rate and regular rhythm.     Pulses: Normal pulses.     Heart sounds: Normal heart sounds.  Pulmonary:     Effort: Pulmonary effort is normal.     Breath sounds: Normal breath sounds.  Musculoskeletal:        General: Tenderness (cervical paraspinal muscles) present. No swelling. Normal range of motion.     Cervical back: Normal range of motion.   Skin:    General: Skin is warm.  Neurological:     General: No focal deficit present.     Mental Status: She is alert and oriented to person, place, and time.  Psychiatric:        Mood and Affect: Mood normal.        Behavior: Behavior normal.        Thought Content: Thought content normal.        Judgment: Judgment normal.    The 10-year ASCVD risk score (Arnett DK, et al., 2019) is: 2.6%    Assessment & Plan:   Problem List Items Addressed This Visit       Other   Chronic lumbar pain - Primary   Chronic, ongoing. She experiences chronic low back pain with sciatica, more severe than previous episodes, with pain radiating from the hip to the groin and down the leg. Current medications provide some relief, and stretching and heat application are beneficial. Order x-rays of the left hip and pelvis area. Continue meloxicam  15 mg daily and gabapentin  300mg  BID as needed. Prescribe cyclobenzaprine  10mg  TID as needed. Encourage continued stretching and heat therapy.      Relevant Medications   cyclobenzaprine  (FLEXERIL ) 10 MG tablet   meloxicam  (MOBIC ) 15 MG tablet   gabapentin  (NEURONTIN ) 300 MG capsule   DULoxetine (CYMBALTA) 30 MG capsule   Other Relevant Orders   DG HIP UNILAT W OR W/O PELVIS 2-3 VIEWS LEFT   Overweight (BMI 25.0-29.9)   BMI 29.9. Weight management was discussed. Previous medications were effective but had side effects or insurance issues. She is interested in Contrave for weight and psychological benefits. Phentermine  was not chosen due to short-term use and stimulant properties. Prescribe Contrave with titration: one tablet daily for 7 days, then one tablet twice daily for 7 days, then two tablets in the morning and one in the afternoon for 7 days, and finally two tablets twice daily if tolerated. Continue focus on nutrition and exercise. Follow-up in 4-6 weeks.       Chronic neck pain   She has chronic neck pain with a large knot, likely exacerbated by stress.  Meloxicam  provides some relief. Continue meloxicam  15mg  daily and start duloxetine 30mg  daily.       Relevant Medications   cyclobenzaprine  (FLEXERIL ) 10 MG tablet   meloxicam  (MOBIC ) 15 MG tablet   gabapentin  (NEURONTIN ) 300 MG capsule   DULoxetine (CYMBALTA) 30 MG capsule   Pain  of left hip   New pain to left hip radiating to inner groin. She denies injury/fall. Tenderness with palpation. Check xray of hip and pelvis. Continue meloxicam  15mg  daily as needed and gabapentin  300mg  BID prn.       Relevant Orders   DG HIP UNILAT W OR W/O PELVIS 2-3 VIEWS LEFT   Anxiety   Chronic, not controlled. She has an anxiety disorder with a previous ineffective trial of hydroxyzine  due to sedation. Discussed Cymbalta for anxiety and chronic pain. Start duloxetine 30mg  daily. Discussed possible side effects. Follow-up in 4-6 weeks.       Relevant Medications   DULoxetine (CYMBALTA) 30 MG capsule    Return in about 5 weeks (around 07/16/2024) for 5-7 weeks weight management, stress.    Tinnie DELENA Harada, NP

## 2024-06-11 NOTE — Assessment & Plan Note (Signed)
 BMI 29.9. Weight management was discussed. Previous medications were effective but had side effects or insurance issues. She is interested in Contrave for weight and psychological benefits. Phentermine  was not chosen due to short-term use and stimulant properties. Prescribe Contrave with titration: one tablet daily for 7 days, then one tablet twice daily for 7 days, then two tablets in the morning and one in the afternoon for 7 days, and finally two tablets twice daily if tolerated. Continue focus on nutrition and exercise. Follow-up in 4-6 weeks.

## 2024-06-11 NOTE — Assessment & Plan Note (Signed)
 Chronic, not controlled. She has an anxiety disorder with a previous ineffective trial of hydroxyzine  due to sedation. Discussed Cymbalta for anxiety and chronic pain. Start duloxetine 30mg  daily. Discussed possible side effects. Follow-up in 4-6 weeks.

## 2024-06-11 NOTE — Assessment & Plan Note (Signed)
 Chronic, ongoing. She experiences chronic low back pain with sciatica, more severe than previous episodes, with pain radiating from the hip to the groin and down the leg. Current medications provide some relief, and stretching and heat application are beneficial. Order x-rays of the left hip and pelvis area. Continue meloxicam  15 mg daily and gabapentin  300mg  BID as needed. Prescribe cyclobenzaprine  10mg  TID as needed. Encourage continued stretching and heat therapy.

## 2024-06-11 NOTE — Patient Instructions (Signed)
 It was great to see you!  Start duloxetine 1 capsule daily for your stress/anxiety/pain   I have refilled your gabapentin , meloxicam , and cyclobenzaprine    Start contrave 1 tablet daily for 7 days, then 1 tablet twice a day for 7 days, then 2 tablets in the morning and 1 in the afternoon for 7 days, then 2 tablets twice a day for weight loss   Let's follow-up in 5-7 weeks, sooner if you have concerns.  If a referral was placed today, you will be contacted for an appointment. Please note that routine referrals can sometimes take up to 3-4 weeks to process. Please call our office if you haven't heard anything after this time frame.  Take care,  Tinnie Harada, NP

## 2024-06-11 NOTE — Assessment & Plan Note (Signed)
 She has chronic neck pain with a large knot, likely exacerbated by stress. Meloxicam  provides some relief. Continue meloxicam  15mg  daily and start duloxetine 30mg  daily.

## 2024-06-11 NOTE — Assessment & Plan Note (Signed)
 New pain to left hip radiating to inner groin. She denies injury/fall. Tenderness with palpation. Check xray of hip and pelvis. Continue meloxicam  15mg  daily as needed and gabapentin  300mg  BID prn.

## 2024-06-12 ENCOUNTER — Other Ambulatory Visit (HOSPITAL_COMMUNITY): Payer: Self-pay

## 2024-06-12 ENCOUNTER — Telehealth: Payer: Self-pay

## 2024-06-12 NOTE — Telephone Encounter (Signed)
 Pharmacy Patient Advocate Encounter   Received notification from Pt Calls Messages that prior authorization for Contrave 8-90MG  er tablets is required/requested.   Insurance verification completed.   The patient is insured through [facility].   Per test claim: PA required; PA submitted to above mentioned insurance via Latent Key/confirmation #/EOC [id number] Status is pending

## 2024-06-12 NOTE — Telephone Encounter (Signed)
 Can we get a prior auth on Contrave  ER 8-90mg ?

## 2024-06-13 ENCOUNTER — Other Ambulatory Visit: Payer: Self-pay | Admitting: Obstetrics and Gynecology

## 2024-06-13 ENCOUNTER — Ambulatory Visit: Payer: Self-pay | Admitting: Nurse Practitioner

## 2024-06-13 DIAGNOSIS — R928 Other abnormal and inconclusive findings on diagnostic imaging of breast: Secondary | ICD-10-CM

## 2024-06-13 NOTE — Telephone Encounter (Signed)
Pending prior auth

## 2024-06-14 ENCOUNTER — Telehealth: Payer: Self-pay

## 2024-06-14 ENCOUNTER — Other Ambulatory Visit (HOSPITAL_COMMUNITY): Payer: Self-pay

## 2024-06-14 NOTE — Telephone Encounter (Signed)
 This is was sent to wrong office

## 2024-06-14 NOTE — Telephone Encounter (Addendum)
 Pharmacy Patient Advocate Encounter  Received notification from [facility] that Prior Authorization for Azelastine -Fluticasone  137-50MCG/ACT suspension has been APPROVED from 10.29.25 to 10.28.26. Ran test claim, Copay is $5.00. This test claim was processed through [facility]- copay amounts may vary at other pharmacies due to pharmacy/plan contracts, or as the patient moves through the different stages of their insurance plan.   PA #/Case ID/Reference #: [id number]    Must run for 30day

## 2024-06-14 NOTE — Telephone Encounter (Signed)
 Left message for patient to return call.

## 2024-06-14 NOTE — Telephone Encounter (Signed)
 Apologies.     Brittany, Please see Approval below.

## 2024-06-14 NOTE — Telephone Encounter (Signed)
 I called patient and left a detailed message that Contrave was approved.

## 2024-06-14 NOTE — Telephone Encounter (Signed)
 Pharmacy Patient Advocate Encounter  Received notification from [facility] that Prior Authorization for Contrave 8-90MG  er tablets has been APPROVED from 10.29.26 to [id number]. Ran test claim, Copay is $24.97. This test claim was processed through [facility]- copay amounts may vary at other pharmacies due to pharmacy/plan contracts, or as the patient moves through the different stages of their insurance plan.   PA #/Case ID/Reference #: [id number]

## 2024-06-14 NOTE — Telephone Encounter (Signed)
 I called patient and left a detailed message that Azelastine  Nasal Spray has been approved.

## 2024-06-17 ENCOUNTER — Encounter: Payer: Self-pay | Admitting: Nurse Practitioner

## 2024-06-27 ENCOUNTER — Ambulatory Visit: Payer: Self-pay | Admitting: Nurse Practitioner

## 2024-06-27 ENCOUNTER — Ambulatory Visit (HOSPITAL_BASED_OUTPATIENT_CLINIC_OR_DEPARTMENT_OTHER)
Admission: RE | Admit: 2024-06-27 | Discharge: 2024-06-27 | Disposition: A | Discharge status: Home / Self Care | Source: Ambulatory Visit | Attending: Nurse Practitioner | Admitting: Nurse Practitioner

## 2024-06-27 DIAGNOSIS — Z79890 Hormone replacement therapy (postmenopausal): Secondary | ICD-10-CM | POA: Diagnosis not present

## 2024-06-27 DIAGNOSIS — M8580 Other specified disorders of bone density and structure, unspecified site: Secondary | ICD-10-CM | POA: Diagnosis not present

## 2024-06-27 DIAGNOSIS — Z6828 Body mass index (BMI) 28.0-28.9, adult: Secondary | ICD-10-CM | POA: Diagnosis not present

## 2024-06-27 DIAGNOSIS — Z780 Asymptomatic menopausal state: Secondary | ICD-10-CM | POA: Diagnosis not present

## 2024-06-27 DIAGNOSIS — Z124 Encounter for screening for malignant neoplasm of cervix: Secondary | ICD-10-CM | POA: Diagnosis not present

## 2024-06-27 DIAGNOSIS — Z01419 Encounter for gynecological examination (general) (routine) without abnormal findings: Secondary | ICD-10-CM | POA: Diagnosis not present

## 2024-07-01 ENCOUNTER — Other Ambulatory Visit: Payer: Self-pay | Admitting: Obstetrics and Gynecology

## 2024-07-01 ENCOUNTER — Ambulatory Visit
Admission: RE | Admit: 2024-07-01 | Discharge: 2024-07-01 | Disposition: A | Discharge status: Home / Self Care | Source: Ambulatory Visit | Attending: Obstetrics and Gynecology | Admitting: Obstetrics and Gynecology

## 2024-07-01 ENCOUNTER — Ambulatory Visit
Admission: RE | Admit: 2024-07-01 | Discharge: 2024-07-01 | Disposition: A | Source: Ambulatory Visit | Attending: Obstetrics and Gynecology | Admitting: Obstetrics and Gynecology

## 2024-07-01 DIAGNOSIS — R928 Other abnormal and inconclusive findings on diagnostic imaging of breast: Secondary | ICD-10-CM | POA: Diagnosis not present

## 2024-07-01 DIAGNOSIS — N6012 Diffuse cystic mastopathy of left breast: Secondary | ICD-10-CM | POA: Diagnosis not present

## 2024-07-17 ENCOUNTER — Ambulatory Visit (INDEPENDENT_AMBULATORY_CARE_PROVIDER_SITE_OTHER): Payer: Self-pay | Admitting: Nurse Practitioner

## 2024-07-17 ENCOUNTER — Encounter: Payer: Self-pay | Admitting: Nurse Practitioner

## 2024-07-17 VITALS — BP 108/78 | HR 84 | Temp 97.6°F | Ht 64.0 in | Wt 169.6 lb

## 2024-07-17 DIAGNOSIS — F419 Anxiety disorder, unspecified: Secondary | ICD-10-CM

## 2024-07-17 DIAGNOSIS — E663 Overweight: Secondary | ICD-10-CM

## 2024-07-17 MED ORDER — CONTRAVE 8-90 MG PO TB12: 2.0000 | ORAL_TABLET | Freq: Two times a day (BID) | ORAL | 1 refills | Status: AC

## 2024-07-17 MED ORDER — CONTRAVE 8-90 MG PO TB12: 2.0000 | ORAL_TABLET | Freq: Two times a day (BID) | ORAL | 1 refills | Status: DC

## 2024-07-17 MED ORDER — DULOXETINE HCL 30 MG PO CPEP: 30.0000 mg | ORAL_CAPSULE | Freq: Every day | ORAL | 3 refills | Status: AC

## 2024-07-17 NOTE — Patient Instructions (Signed)
 It was great to see you!  I have sent your contrave  in to [facility]   Keep up the great work!   Let's follow-up in 3 months, sooner if you have concerns.  If a referral was placed today, you will be contacted for an appointment. Please note that routine referrals can sometimes take up to 3-4 weeks to process. Please call our office if you haven't heard anything after this time frame.  Take care,  Tinnie Harada, NP

## 2024-07-17 NOTE — Assessment & Plan Note (Signed)
 Chronic, stable. Improving with Cymbalta . 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 Previous side effects resolved. Continue Cymbalta  30mg  daily.

## 2024-07-17 NOTE — Progress Notes (Signed)
 Established Patient Office Visit  Subjective   Patient ID: Carla Fuller, female    DOB: 26-Feb-1966  Age: 58 y.o. MRN: [id number]  Chief Complaint  Patient presents with   Weight Managment    Follow up, discuss stress managment    HPI Discussed the use of AI scribe software for clinical note transcription with the patient, who gave verbal consent to proceed.  History of Present Illness   Carla Fuller is a 58 year old female who presents for follow-up regarding stress management and weight loss.  She reports much improved stress and mood since resigning from her stressful job on November 12 and feels back to her usual self. She attributes benefit to Cymbalta , which she is taking without side effects.  She started Contrave  and has lost 5 pounds, even over Thanksgiving. She notes decreased cravings and less desire for sweets, and after dinner she no longer snacks at night. She takes one pill in the morning and one at night. She recently joined the [employer] and started regular workouts.        07/17/2024    1:42 PM 06/11/2024    8:45 AM 04/11/2024    2:06 PM 02/21/2024    8:43 AM 09/08/2021    3:30 PM  Depression screen PHQ 2/9  Decreased Interest 0 1 1 1  0  Down, Depressed, Hopeless 0 2 1 0 0  PHQ - 2 Score 0 3 2 1  0  Altered sleeping 0 1 1 0   Tired, decreased energy 0 3 1 1    Change in appetite 0 3 1 3    Feeling bad or failure about yourself  0 1 0 0   Trouble concentrating 0 3 1 0   Moving slowly or fidgety/restless 0 0 0 0   Suicidal thoughts 0 0 0 0   PHQ-9 Score 0 14  6  5     Difficult doing work/chores Not difficult at all Very difficult Somewhat difficult Somewhat difficult      Data saved with a previous flowsheet row definition      07/17/2024    1:42 PM 06/11/2024    8:45 AM 04/11/2024    2:07 PM 02/21/2024    8:43 AM  GAD 7 : Generalized Anxiety Score  Nervous, Anxious, on Edge 0 3 3 0  Control/stop worrying 0 2 3 0  Worry too much - different things 0 3 2 0   Trouble relaxing 0 2 3 0  Restless 0 0 0 0  Easily annoyed or irritable 0 3 3 1   Afraid - awful might happen 0 0 0 0  Total GAD 7 Score 0 13 14 1   Anxiety Difficulty Not difficult at all Very difficult Somewhat difficult Not difficult at all     ROS See pertinent positives and negatives per HPI.    Objective:     BP 108/78 (BP Location: Left Arm, Patient Position: Sitting, Cuff Size: Normal)   Pulse 84   Temp 97.6 F (36.4 C)   Ht 5' 4 (1.626 m)   Wt 169 lb 9.6 oz (76.9 kg)   SpO2 99%   BMI 29.11 kg/m  BP Readings from Last 3 Encounters:  07/17/24 108/78  06/11/24 128/74  06/06/24 120/71   Wt Readings from Last 3 Encounters:  07/17/24 169 lb 9.6 oz (76.9 kg)  06/11/24 174 lb 9.6 oz (79.2 kg)  06/06/24 160 lb (72.6 kg)      Physical Exam Vitals and nursing note reviewed.  Constitutional:  General: She is not in acute distress.    Appearance: Normal appearance.  HENT:     Head: Normocephalic.  Eyes:     Conjunctiva/sclera: Conjunctivae normal.  Cardiovascular:     Rate and Rhythm: Normal rate and regular rhythm.     Pulses: Normal pulses.     Heart sounds: Normal heart sounds.  Pulmonary:     Effort: Pulmonary effort is normal.     Breath sounds: Normal breath sounds.  Musculoskeletal:     Cervical back: Normal range of motion.  Skin:    General: Skin is warm.  Neurological:     General: No focal deficit present.     Mental Status: She is alert and oriented to person, place, and time.  Psychiatric:        Mood and Affect: Mood normal.        Behavior: Behavior normal.        Thought Content: Thought content normal.        Judgment: Judgment normal.    The 10-year ASCVD risk score (Arnett DK, et al., 2019) is: 2.1%    Assessment & Plan:   Problem List Items Addressed This Visit       Other   Overweight (BMI 25.0-29.9)   BMI 29.1. Management with Contrave  has resulted in a five-pound weight loss, decreased cravings, and improved energy.  She is exercising regularly. Sent Contrave  prescription to [facility] for two in the morning and two at night dosing. Encouraged continued exercise and healthy eating habits. Follow-up in 3 months.      Anxiety - Primary   Chronic, stable. Improving with Cymbalta . 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 Previous side effects resolved. Continue Cymbalta  30mg  daily.       Relevant Medications   DULoxetine  (CYMBALTA ) 30 MG capsule   Return in about 3 months (around 10/15/2024) for weight management .    Tinnie DELENA Harada, NP

## 2024-07-17 NOTE — Assessment & Plan Note (Signed)
 BMI 29.1. Management with Contrave  has resulted in a five-pound weight loss, decreased cravings, and improved energy. She is exercising regularly. Sent Contrave  prescription to [facility] for two in the morning and two at night dosing. Encouraged continued exercise and healthy eating habits. Follow-up in 3 months.

## 2024-09-09 ENCOUNTER — Ambulatory Visit

## 2024-09-09 ENCOUNTER — Other Ambulatory Visit

## 2024-09-11 ENCOUNTER — Other Ambulatory Visit: Payer: Self-pay | Admitting: Nurse Practitioner

## 2024-09-12 NOTE — Telephone Encounter (Signed)
 Requesting: CYCLOBENZAPRINE  10MG  TABLETS  Last Visit: 07/17/2024 Next Visit: 10/16/2024 Last Refill: 06/11/2024  Please Advise

## 2024-10-16 ENCOUNTER — Ambulatory Visit: Payer: Self-pay | Admitting: Nurse Practitioner
